# Patient Record
Sex: Female | Born: 1975 | Race: White | Hispanic: Yes | Marital: Married | Smoking: Never smoker
Health system: Southern US, Community
[De-identification: ages and names within clinical notes are randomized; demographics above are authoritative.]

## PROBLEM LIST (undated history)

## (undated) DIAGNOSIS — E039 Hypothyroidism, unspecified: Secondary | ICD-10-CM

## (undated) HISTORY — DX: Hypothyroidism, unspecified: E03.9

## (undated) HISTORY — PX: LAPAROSCOPIC OVARIAN CYSTECTOMY: SHX6248

## (undated) HISTORY — PX: BREAST REDUCTION SURGERY: SHX8

## (undated) HISTORY — PX: DILATION AND CURETTAGE OF UTERUS: SHX78

## (undated) HISTORY — PX: LIPOSUCTION: SHX10

## (undated) HISTORY — PX: APPENDECTOMY: SHX54

---

## 1998-04-22 ENCOUNTER — Emergency Department (HOSPITAL_COMMUNITY): Admission: EM | Admit: 1998-04-22 | Discharge: 1998-04-22 | Payer: Self-pay

## 1998-10-04 ENCOUNTER — Encounter: Payer: Self-pay | Admitting: Obstetrics and Gynecology

## 1998-10-04 ENCOUNTER — Inpatient Hospital Stay (HOSPITAL_COMMUNITY): Admission: AD | Admit: 1998-10-04 | Discharge: 1998-10-04 | Payer: Self-pay | Admitting: Obstetrics and Gynecology

## 1999-03-04 ENCOUNTER — Inpatient Hospital Stay (HOSPITAL_COMMUNITY): Admission: AD | Admit: 1999-03-04 | Discharge: 1999-03-04 | Payer: Self-pay | Admitting: Obstetrics & Gynecology

## 1999-05-17 ENCOUNTER — Inpatient Hospital Stay (HOSPITAL_COMMUNITY): Admission: AD | Admit: 1999-05-17 | Discharge: 1999-05-20 | Payer: Self-pay | Admitting: Obstetrics and Gynecology

## 1999-12-15 ENCOUNTER — Encounter: Admission: RE | Admit: 1999-12-15 | Discharge: 2000-03-14 | Payer: Self-pay | Admitting: Obstetrics and Gynecology

## 2000-07-22 ENCOUNTER — Encounter: Payer: Self-pay | Admitting: Obstetrics and Gynecology

## 2000-07-22 ENCOUNTER — Inpatient Hospital Stay (HOSPITAL_COMMUNITY): Admission: AD | Admit: 2000-07-22 | Discharge: 2000-07-22 | Payer: Self-pay | Admitting: Obstetrics and Gynecology

## 2000-07-23 ENCOUNTER — Other Ambulatory Visit: Admission: RE | Admit: 2000-07-23 | Discharge: 2000-07-23 | Payer: Self-pay | Admitting: Obstetrics and Gynecology

## 2000-07-24 ENCOUNTER — Encounter (INDEPENDENT_AMBULATORY_CARE_PROVIDER_SITE_OTHER): Payer: Self-pay

## 2000-07-24 ENCOUNTER — Ambulatory Visit (HOSPITAL_COMMUNITY): Admission: AD | Admit: 2000-07-24 | Discharge: 2000-07-24 | Payer: Self-pay | Admitting: Obstetrics and Gynecology

## 2001-08-06 ENCOUNTER — Other Ambulatory Visit: Admission: RE | Admit: 2001-08-06 | Discharge: 2001-08-06 | Payer: Self-pay | Admitting: Obstetrics and Gynecology

## 2002-06-28 ENCOUNTER — Emergency Department (HOSPITAL_COMMUNITY): Admission: EM | Admit: 2002-06-28 | Discharge: 2002-06-28 | Payer: Self-pay | Admitting: Emergency Medicine

## 2002-07-28 ENCOUNTER — Other Ambulatory Visit: Admission: RE | Admit: 2002-07-28 | Discharge: 2002-07-28 | Payer: Self-pay | Admitting: Obstetrics and Gynecology

## 2003-04-24 ENCOUNTER — Other Ambulatory Visit: Admission: RE | Admit: 2003-04-24 | Discharge: 2003-04-24 | Payer: Self-pay | Admitting: Obstetrics and Gynecology

## 2003-11-18 ENCOUNTER — Inpatient Hospital Stay (HOSPITAL_COMMUNITY): Admission: AD | Admit: 2003-11-18 | Discharge: 2003-11-21 | Payer: Self-pay | Admitting: Obstetrics and Gynecology

## 2003-11-22 ENCOUNTER — Encounter: Admission: RE | Admit: 2003-11-22 | Discharge: 2003-12-22 | Payer: Self-pay | Admitting: Obstetrics and Gynecology

## 2003-12-23 ENCOUNTER — Encounter: Admission: RE | Admit: 2003-12-23 | Discharge: 2004-01-22 | Payer: Self-pay | Admitting: Obstetrics and Gynecology

## 2004-01-13 ENCOUNTER — Other Ambulatory Visit: Admission: RE | Admit: 2004-01-13 | Discharge: 2004-01-13 | Payer: Self-pay | Admitting: Obstetrics and Gynecology

## 2004-11-15 ENCOUNTER — Ambulatory Visit: Payer: Self-pay | Admitting: Internal Medicine

## 2004-12-01 ENCOUNTER — Ambulatory Visit: Payer: Self-pay | Admitting: Gastroenterology

## 2004-12-02 ENCOUNTER — Ambulatory Visit: Payer: Self-pay | Admitting: Gastroenterology

## 2005-01-27 ENCOUNTER — Other Ambulatory Visit: Admission: RE | Admit: 2005-01-27 | Discharge: 2005-01-27 | Payer: Self-pay | Admitting: Obstetrics and Gynecology

## 2005-07-26 ENCOUNTER — Other Ambulatory Visit: Admission: RE | Admit: 2005-07-26 | Discharge: 2005-07-26 | Payer: Self-pay | Admitting: Obstetrics and Gynecology

## 2006-10-02 ENCOUNTER — Ambulatory Visit (HOSPITAL_COMMUNITY): Admission: RE | Admit: 2006-10-02 | Discharge: 2006-10-02 | Payer: Self-pay | Admitting: Obstetrics and Gynecology

## 2006-10-02 ENCOUNTER — Encounter (INDEPENDENT_AMBULATORY_CARE_PROVIDER_SITE_OTHER): Payer: Self-pay | Admitting: *Deleted

## 2007-07-12 ENCOUNTER — Emergency Department (HOSPITAL_COMMUNITY): Admission: EM | Admit: 2007-07-12 | Discharge: 2007-07-12 | Payer: Self-pay

## 2007-10-25 ENCOUNTER — Inpatient Hospital Stay (HOSPITAL_COMMUNITY): Admission: AD | Admit: 2007-10-25 | Discharge: 2007-10-27 | Payer: Self-pay | Admitting: Obstetrics and Gynecology

## 2009-12-19 ENCOUNTER — Emergency Department (HOSPITAL_COMMUNITY): Admission: EM | Admit: 2009-12-19 | Discharge: 2009-12-19 | Payer: Self-pay | Admitting: Emergency Medicine

## 2010-12-07 LAB — POCT I-STAT, CHEM 8
BUN: 12 mg/dL (ref 6–23)
Calcium, Ion: 1.04 mmol/L — ABNORMAL LOW (ref 1.12–1.32)
Chloride: 104 mEq/L (ref 96–112)
Creatinine, Ser: 0.8 mg/dL (ref 0.4–1.2)
Glucose, Bld: 94 mg/dL (ref 70–99)
HCT: 43 % (ref 36.0–46.0)
Hemoglobin: 14.6 g/dL (ref 12.0–15.0)
Potassium: 3.5 mEq/L (ref 3.5–5.1)
Sodium: 138 mEq/L (ref 135–145)
TCO2: 20 mmol/L (ref 0–100)

## 2010-12-07 LAB — ETHANOL: Alcohol, Ethyl (B): 181 mg/dL — ABNORMAL HIGH (ref 0–10)

## 2011-02-03 NOTE — H&P (Signed)
[facility]  Patient:    Kelsey [name], Kelsey [name]                      MRN: [id number] Adm. Date:  [id number] Attending:  Trevor Iha                         History and Physical  HISTORY OF PRESENT ILLNESS:   The patient is a 35 year old gravida 2, para 1, at approximately 2-1/2 weeks estimated gestational age by her last menstrual period who presented three days ago to the emergency room with spotting and cramping.  Ultrasound at that time revealed a 6-1/2 week estimated gestational sac with no embryo seen.  Felt to be consistent with ________ embryonic pregnancy.  The next day in the office, she had a beta hCG and a progesterone level drawn which showed progesterone level of 9.6 consistent with a nonviable pregnancy and a quantitative beta hCG of 16,761.  At that time, pelvic exam was consistent with an incomplete abortion, and the patient was scheduled for dilation and evacuation.  She presents today for this.  LABORATORY DATA:              Blood type is B-positive.  Hemoglobin 12.5.  PHYSICAL EXAMINATION:  VITAL SIGNS:                  Blood pressure is 100.62, weight 184.  HEART:                        Regular rate and rhythm.  LUNGS:                        Clear to auscultation bilaterally.  PELVIC EXAMINATION:           Reveals a uterus that is 6-8 weeks size.  Cervix is 1 cm dilated, long, minimal to mild bleeding.  IMPRESSION AND PLAN:          Incomplete abortion, approximately 6-1/2 to 7 weeks estimated gestational size, 10-1/2 weeks estimated gestational age. The patient was offered expectant management versus D&E.  The patient request D&E.  Risks and benefits were discussed at length including, but not limited to risks of infection, bleeding, damage to uterus, tubes, ovaries, bowel, or bladder.  The patient gives her informed consent. DD:  07/24/00 TD:  07/24/00 Job: [id number]

## 2011-02-03 NOTE — Op Note (Signed)
Kelsey [name], Kelsey [name]               ACCOUNT NO.:  [id number]   MEDICAL RECORD NO.:  [id number]          PATIENT TYPE:  AMB   LOCATION:  SDC                           FACILITY:  [facility]   PHYSICIAN:  Dineen Kid. Rana Snare, M.D.    DATE OF BIRTH:  Sep 01, 1976   DATE OF PROCEDURE:  10/02/2006  DATE OF DISCHARGE:                               OPERATIVE REPORT   PREOPERATIVE DIAGNOSIS:  Missed abortion, approximately eight weeks  gestational age.   POSTOPERATIVE DIAGNOSIS:  Missed abortion, approximately eight weeks  gestational age.   OPERATION/PROCEDURE:  Dilation and evacuation.   SURGEON:  Dineen Kid. Rana Snare, M.D.   ANESTHESIA:  Monitored anesthesia care and local block by paracervical  block.   INDICATIONS:  Mrs. Kelsey [name] is a 35 year old, gravida 6, para 5, who  presented yesterday to the office for new OB evaluation.  Ultrasound  reveals 7-1/[redacted] week gestational size fetus with no fetal heart activity  consistent with a missed abortion.  She desires dilation and evacuation.  The risks and benefits were and informed consent was obtained.  Blood  type is B positive.   DESCRIPTION OF PROCEDURE:  After adequate anesthesia, the patient was  placed in the dorsal lithotomy position. She was sterilely prepped and  draped.  The bladder was sterilely drained.  Weighted speculum was  placed.  Paracervical block was placed with 1% Xylocaine a total of  __________ used.  Tenaculum placed on the anterior lip of the cervix.  Uterus was sounded to 10 cm.  Uterus was dilated to a #29 Pratt dilator.  An 8 mm suction curet was inserted.  Products of conception were  retrieved.  Curettage was performed until a gritty surface was felt  throughout the endometrial cavity and no residual products of conception  was retrieved.  The patient was given Methergine 0.2 mg IM with good  __________ response.  The curet was then removed. Tenaculum removed from  the anterior lip of the cervix and found to be hemostatic.  Speculum  was  then removed. The patient was transferred to the recovery room in stable  condition.   Sponge and instrument count was normal x3.  Estimated blood loss was  minimal.  The patient received Toradol 30 mg IV postoperatively.   DISPOSITION:  The patient will be discharged home and will follow up in  the office in two to three weeks.  She was given a prescription for  Methergine 0.2 mg to take every eight hours for two days, doxycycline  100 mg p.o. b.i.d. for seven days.  Return in two to three weeks for  postoperative check.      Dineen Kid Rana Snare, M.D.  Electronically Signed    DCL/MEDQ  D:  10/02/2006  T:  10/02/2006  Job:  [id number]

## 2011-02-03 NOTE — Op Note (Signed)
[facility]  Patient:    [name], Kelsey [name]                      MRN: [id number] Adm. Date:  [id number] Attending:  Trevor Iha                           Operative Report  PREOPERATIVE DIAGNOSIS:       Incomplete abortion approximately six and a half weeks.  POSTOPERATIVE DIAGNOSIS:      Incomplete abortion approximately six and a half weeks.  PROCEDURE:                    Dilation and evacuation.  ANESTHESIA:                   Monitored anesthetic care and paracervical block.  SURGEON:                      Trevor Iha, M.D.  INDICATIONS:                  Ms. Myer Haff is a 35 year old G2, P1 at 7 1/2 weeks estimated gestational age with a 6 1/2 week sized gestational age consistent with anembrionic pregnancy.  Also, progesterone level of 9.6 and cervix is dilated to 1 cm with cramping and mild bleeding.  She presents today for dilation and evacuation for incomplete abortion.  Risks and benefits were discussed at length.  Informed consent was obtained.  Patients blood type was B+.  Hemoglobin was 12.5.  DESCRIPTION OF PROCEDURE:     After adequate analgesia the patient placed in the dorsal lithotomy position she is sterilely prepped and draped.  Bladder was sterilely drained.  Tenaculum was placed on the anterior lip of the cervix.  Uterus is sounded to 10 cm, easily dilated to #31 Hegar dilator.  An 8 mm suction curette was inserted and products of conception were retrieved until the endometrial cavity was felt to be empty followed by gentle sharp curettage sounding a gritty surface throughout the endometrial cavity.  Second pass of the curette with no more products of conception retrieved.  At this time the curette was removed.  Tenaculum was removed from the anterior lip of the cervix and noted to be hemostatic.  The speculum was then removed.  The patient tolerated procedure well with stable transfer to the recovery room. The  patient received Methergine 0.2 mg IM at the end in the operating room as well as Toradol 30 mg IV.  ESTIMATED BLOOD LOSS:         25 cc.  DISPOSITION:                  Patient will be discharged home.  Follow up in the office in two to three weeks.  She is given a routine instruction sheet for D&C and a prescription for doxycycline 100 mg p.o. b.i.d. x 7 days and Methergine 0.2 mg to take every eight hours for two days. DD:  07/24/00 TD:  07/25/00 Job: [id number]

## 2011-06-09 LAB — CBC
HCT: 35.6 — ABNORMAL LOW
Hemoglobin: 11.8 — ABNORMAL LOW
Hemoglobin: 11.9 — ABNORMAL LOW
MCHC: 33.5
MCHC: 33.5
MCV: 89.9
RBC: 3.96
RBC: 3.97
WBC: 6.8

## 2011-06-09 LAB — RPR: RPR Ser Ql: NONREACTIVE

## 2013-07-06 ENCOUNTER — Ambulatory Visit (INDEPENDENT_AMBULATORY_CARE_PROVIDER_SITE_OTHER): Payer: BC Managed Care – PPO | Admitting: Family Medicine

## 2013-07-06 VITALS — BP 102/58 | HR 64 | Temp 98.3°F | Resp 16 | Ht 62.0 in | Wt 146.0 lb

## 2013-07-06 DIAGNOSIS — M436 Torticollis: Secondary | ICD-10-CM

## 2013-07-06 DIAGNOSIS — M542 Cervicalgia: Secondary | ICD-10-CM

## 2013-07-06 MED ORDER — OXYCODONE-ACETAMINOPHEN 5-325 MG PO TABS: 1.0000 | ORAL_TABLET | ORAL | Status: DC | PRN

## 2013-07-06 MED ORDER — PREDNISONE 20 MG PO TABS: ORAL_TABLET | ORAL | Status: DC

## 2013-07-06 NOTE — Patient Instructions (Signed)
Torticollis, Acute °You have suddenly (acutely) developed a twisted neck (torticollis). This is usually a self-limited condition. °CAUSES  °Acute torticollis may be caused by malposition, trauma or infection. Most commonly, acute torticollis is caused by sleeping in an awkward position. Torticollis may also be caused by the flexion, extension or twisting of the neck muscles beyond their normal position. Sometimes, the exact cause may not be known. °SYMPTOMS  °Usually, there is pain and limited movement of the neck. Your neck may twist to one side. °DIAGNOSIS  °The diagnosis is often made by physical examination. X-rays, CT scans or MRIs may be done if there is a history of trauma or concern of infection. °TREATMENT  °For a common, stiff neck that develops during sleep, treatment is focused on relaxing the contracted neck muscle. Medications (including shots) may be used to treat the problem. Most cases resolve in several days. Torticollis usually responds to conservative physical therapy. If left untreated, the shortened and spastic neck muscle can cause deformities in the face and neck. Rarely, surgery is required. °HOME CARE INSTRUCTIONS  °· Use over-the-counter and prescription medications as directed by your caregiver. °· Do stretching exercises and massage the neck as directed by your caregiver. °· Follow up with physical therapy if needed and as directed by your caregiver. °SEEK IMMEDIATE MEDICAL CARE IF:  °· You develop difficulty breathing or noisy breathing (stridor). °· You drool, develop trouble swallowing or have pain with swallowing. °· You develop numbness or weakness in the hands or feet. °· You have changes in speech or vision. °· You have problems with urination or bowel movements. °· You have difficulty walking. °· You have a fever. °· You have increased pain. °MAKE SURE YOU:  °· Understand these instructions. °· Will watch your condition. °· Will get help right away if you are not doing well or  get worse. °Document Released: 09/01/2000 Document Revised: 11/27/2011 Document Reviewed: 10/13/2009 °ExitCare® Patient Information ©2014 [employer]. ° °

## 2013-07-06 NOTE — Progress Notes (Signed)
Subjective:  This chart was scribed for Elvina Sidle, MD by Carl Best, Medical Scribe. This patient was seen in Room 11 and the patient's care was started at 11:05 AM.    Patient ID: Kelsey [name], female    DOB: 14-Sep-1976, 37 y.o.   MRN: [id number]  HPI HPI Comments: Kelsey [name] is a 37 y.o. female who presents to the [facility] complaining of constant, sharp neck pain radiating to the right side of her head when she lays down that woke her out of her sleep this morning.  She states that the tips of her right index, middle, and ring fingers got "tingly" at the time of the incident and that her eye felt "hot".  She states that leaning her head to the right and rotating her head to her left aggravates the pain.  She states that she has had muscle spasms in her neck before.  The patient denies any recent falls or traumas.  She states that she has taken flexeril for her pain with no relief.  She believes that the medication had expired.  She states that she went for a long run yesterday.  The patient is left-hand dominant.  She states that she works at [employer].    No past medical history on file. Past Surgical History  Procedure Laterality Date   Appendectomy     Dilation and curettage of uterus     Family History  Problem Relation Age of Onset   Diabetes Mother    Hyperlipidemia Mother    Hypertension Mother    Heart disease Father    Hyperlipidemia Father    Hypertension Father    Hyperlipidemia Sister    Hypertension Sister    Hyperlipidemia Brother    Hypertension Brother    History   Social History   Marital Status: Divorced    Spouse Name: N/A    Number of Children: N/A   Years of Education: N/A   Occupational History   Not on file.   Social History Main Topics   Smoking status: Never Smoker    Smokeless tobacco: Not on file   Alcohol Use: Yes   Drug Use: No   Sexual Activity: Not on file   Other Topics Concern    Not on file   Social History Narrative   No narrative on file   Current outpatient prescriptions:chlorzoxazone (PARAFON) 500 MG tablet, Take 500 mg by mouth 4 (four) times daily as needed for muscle spasms., Disp: , Rfl: ;  diclofenac (VOLTAREN) 75 MG EC tablet, Take 75 mg by mouth 2 (two) times daily., Disp: , Rfl: ;  fish oil-omega-3 fatty acids 1000 MG capsule, Take 2 g by mouth daily., Disp: , Rfl: ;  phentermine 37.5 MG capsule, Take 37.5 mg by mouth every morning., Disp: , Rfl:  vitamin A 7500 UNIT capsule, Take 7,500 Units by mouth daily., Disp: , Rfl:   Filed Vitals:   07/06/13 0947  BP: 102/58  Pulse: 64  Temp: 98.3 F (36.8 C)  TempSrc: Oral  Resp: 16  Height: 5\' 2"  (1.575 m)  Weight: 146 lb (66.225 kg)  SpO2: 99%     Review of Systems  Musculoskeletal: Positive for neck pain and neck stiffness.  Neurological: Positive for numbness.  All other systems reviewed and are negative.       Objective:   Physical Exam  Nursing note and vitals reviewed. Constitutional: She is oriented to person, place, and time. She appears well-developed  and well-nourished.  HENT:  Head: Normocephalic and atraumatic.  Eyes: Conjunctivae and EOM are normal. Pupils are equal, round, and reactive to light.  Neck: Normal range of motion. Neck supple.  Cardiovascular: Normal rate, regular rhythm and normal heart sounds.   Pulmonary/Chest: Effort normal and breath sounds normal.  Abdominal: Soft. Bowel sounds are normal.  Musculoskeletal: Normal range of motion.  Neurological: She is alert and oriented to person, place, and time.  Skin: Skin is warm and dry.  Psychiatric: She has a normal mood and affect.  reflexes and arm/hand strength normal  DIAGNOSTIC STUDIES: Oxygen Saturation is 99% on room air, normal by my interpretation.    COORDINATION OF CARE: 11:08 AM- Discussed discharging the patient with pain medication and muscle relaxers.  Advised the patient to place a warm towel on  the back of her neck and move her neck around when she wakes up in the morning.  The patient agreed to the treatment plan.         Assessment & Plan:   Meds ordered this encounter  Medications   phentermine 37.5 MG capsule    Sig: Take 37.5 mg by mouth every morning.   fish oil-omega-3 fatty acids 1000 MG capsule    Sig: Take 2 g by mouth daily.   vitamin A 7500 UNIT capsule    Sig: Take 7,500 Units by mouth daily.   diclofenac (VOLTAREN) 75 MG EC tablet    Sig: Take 75 mg by mouth 2 (two) times daily.   chlorzoxazone (PARAFON) 500 MG tablet    Sig: Take 500 mg by mouth 4 (four) times daily as needed for muscle spasms.    I personally performed the services described in this documentation, which was scribed in my presence. The recorded information has been reviewed and is accurate.  Neck pain - Plan: oxyCODONE-acetaminophen (ROXICET) 5-325 MG per tablet, predniSONE (DELTASONE) 20 MG tablet  Torticollis - Plan: oxyCODONE-acetaminophen (ROXICET) 5-325 MG per tablet, predniSONE (DELTASONE) 20 MG tablet  Signed, Elvina Sidle, MD

## 2013-07-24 ENCOUNTER — Other Ambulatory Visit: Payer: Self-pay

## 2015-03-11 ENCOUNTER — Other Ambulatory Visit: Payer: Self-pay | Admitting: Obstetrics and Gynecology

## 2015-03-12 LAB — CYTOLOGY - PAP

## 2016-04-11 LAB — OB RESULTS CONSOLE HIV ANTIBODY (ROUTINE TESTING): HIV: NONREACTIVE

## 2016-04-11 LAB — OB RESULTS CONSOLE ABO/RH: RH Type: POSITIVE

## 2016-04-11 LAB — OB RESULTS CONSOLE ANTIBODY SCREEN: ANTIBODY SCREEN: NEGATIVE

## 2016-04-11 LAB — OB RESULTS CONSOLE GC/CHLAMYDIA
CHLAMYDIA, DNA PROBE: NEGATIVE
GC PROBE AMP, GENITAL: NEGATIVE

## 2016-04-11 LAB — OB RESULTS CONSOLE RUBELLA ANTIBODY, IGM: RUBELLA: IMMUNE

## 2016-04-11 LAB — OB RESULTS CONSOLE RPR: RPR: NONREACTIVE

## 2016-04-11 LAB — OB RESULTS CONSOLE HEPATITIS B SURFACE ANTIGEN: Hepatitis B Surface Ag: NEGATIVE

## 2016-09-18 NOTE — L&D Delivery Note (Signed)
Operative Delivery Note At 9:40 PM a viable female was delivered via Vaginal, [employer], operational(Extractor).  Presentation: vertex; Position: Left,, Occiput,, Posterior; Station: +3.  Verbal consent: obtained from patient.  Risks and benefits discussed in detail.  Risks include, but are not limited to the risks of anesthesia, bleeding, infection, damage to maternal tissues, fetal cephalhematoma.  There is also the risk of inability to effect vaginal delivery of the head, or shoulder dystocia that cannot be resolved by established maneuvers, leading to the need for emergency cesarean section.  APGAR: 8, 9; weight  .   Placenta status: delivered spontaneously  Cord:  with the following complications:none .  Cord pH: not obtained  Anesthesia:  epidural Instruments: mushroom Episiotomy: None Lacerations: none  Suture Repair: not applicable Est. Blood Loss (mL):  300  Mom to postpartum.  Baby to Couplet care / Skin to Skin.  Tryton Bodi L 11/10/2016, 9:49 PM

## 2016-11-05 ENCOUNTER — Inpatient Hospital Stay (HOSPITAL_COMMUNITY)
Admission: AD | Admit: 2016-11-05 | Discharge: 2016-11-05 | Disposition: A | Discharge status: Home / Self Care | Payer: BLUE CROSS/BLUE SHIELD | Source: Ambulatory Visit | Attending: Obstetrics and Gynecology | Admitting: Obstetrics and Gynecology

## 2016-11-05 ENCOUNTER — Encounter (HOSPITAL_COMMUNITY): Payer: Self-pay

## 2016-11-05 DIAGNOSIS — O26893 Other specified pregnancy related conditions, third trimester: Secondary | ICD-10-CM

## 2016-11-05 DIAGNOSIS — Z3A37 37 weeks gestation of pregnancy: Secondary | ICD-10-CM | POA: Diagnosis not present

## 2016-11-05 DIAGNOSIS — N898 Other specified noninflammatory disorders of vagina: Secondary | ICD-10-CM | POA: Insufficient documentation

## 2016-11-05 DIAGNOSIS — O471 False labor at or after 37 completed weeks of gestation: Secondary | ICD-10-CM

## 2016-11-05 DIAGNOSIS — O479 False labor, unspecified: Secondary | ICD-10-CM

## 2016-11-05 LAB — POCT FERN TEST: POCT Fern Test: NEGATIVE

## 2016-11-05 NOTE — MAU Provider Note (Signed)
Chief Complaint:  Rupture of Membranes   None     HPI: Kelsey [name] is a 41 y.o. G4P0 at [redacted]w[redacted]d who presents to maternity admissions reporting possible ROM.  Patient states yesterday, at about noon, she noted her mucous plug came out. Shortly after that, she noted a decent amount of fluid come out of the vagina and then slowly leaking the rest of the day and this morning, but not soaking underwear.   Denies strong regular contractions or vaginal bleeding. Good fetal movement.   Pregnancy Course:   Past Medical History: History reviewed. No pertinent past medical history.  Past obstetric history: OB History  Gravida Para Term Preterm AB Living  4         3  SAB TAB Ectopic Multiple Live Births               # Outcome Date GA Lbr Len/2nd Weight Sex Delivery Anes PTL Lv  4 Current           3 Gravida           2 Gravida           1 [place]               Past Surgical History: Past Surgical History:  Procedure Laterality Date  . APPENDECTOMY    . DILATION AND CURETTAGE OF UTERUS       Family History: Family History  Problem Relation Age of Onset  . Diabetes Mother   . Hyperlipidemia Mother   . Hypertension Mother   . Heart disease Father   . Hyperlipidemia Father   . Hypertension Father   . Hyperlipidemia Sister   . Hypertension Sister   . Hyperlipidemia Brother   . Hypertension Brother     Social History: Social History  Substance Use Topics  . Smoking status: Never Smoker  . Smokeless tobacco: Never Used  . Alcohol use No    Allergies: No Known Allergies  Meds:  Prescriptions Prior to Admission  Medication Sig Dispense Refill Last Dose  . hydrOXYzine (VISTARIL) 25 MG capsule Take 25 mg by mouth daily as needed for anxiety.   11/04/2016  . Prenatal Vit-Fe Fumarate-FA (MULTIVITAMIN-PRENATAL) 27-0.8 MG TABS tablet Take 1 tablet by mouth daily at 12 noon.   11/04/2016    I have reviewed patient's Past Medical Hx, Surgical Hx, Family Hx, Social Hx,  medications and allergies.   ROS:  A comprehensive ROS was negative except per HPI.    Physical Exam   Patient Vitals for the past 24 hrs:  BP Temp Pulse Resp  11/05/16 1233 105/67 - 90 18  11/05/16 1156 112/62 97.5 F (36.4 C) 92 18   Constitutional: Well-developed, well-nourished female in no acute distress.  Cardiovascular: normal rate Respiratory: normal effort GI: Abd soft, non-tender, gravid appropriate for gestational age. Pos BS x 4 MS: Extremities nontender, no edema, normal ROM Neurologic: Alert and oriented x 4.  GU: Neg CVAT. Pelvic: NEFG, large amount of normal physiologic discharge, no blood, cervix clean, NO POOLING. No CMT Dilation: 3 Effacement (%): 60 Cervical Position: Posterior Presentation: Vertex Exam by:: Dr. Omer Jack     Labs: Results for orders placed or performed during the hospital encounter of 11/05/16 (from the past 24 hour(s))  Fern Test     Status: None   Collection Time: 11/05/16 12:33 PM  Result Value Ref Range   POCT Fern Test Negative = intact amniotic membranes     Imaging:  No  results found.  MAU Course: SSE - no pooling SVE - 3/60/-3 FERN NEG  I personally reviewed the patient's NST today, found to be REACTIVE. 140 bpm, mod var, +accels, no decels. CTX: Infrequent.   MDM: Plan of care reviewed with patient, including labs and tests ordered and medical treatment. Discussed with patient normal physiologic discharge, not likely ROM. OK for discharge, not currently in labor.    Assessment: 1. Vaginal discharge during pregnancy in third trimester   2. False labor     Plan: Discharge home in stable condition.  Labor precautions and fetal kick counts    Allergies as of 11/05/2016   No Known Allergies     Medication List    TAKE these medications   hydrOXYzine 25 MG capsule Commonly known as:  VISTARIL Take 25 mg by mouth daily as needed for anxiety.   multivitamin-prenatal 27-0.8 MG Tabs tablet Take 1 tablet by  mouth daily at 12 noon.       Jen MowElizabeth Amariz Flamenco, DO OB Fellow [facility] 11/05/2016 12:37 PM

## 2016-11-05 NOTE — MAU Note (Signed)
Pt presents to MAU with ROM. Reports yesterday around 1200 she felt a small gush, and has been leaking since. Reports mild, irregular ctx.Denies bleeding. Reports good fetal movement.

## 2016-11-05 NOTE — Discharge Instructions (Signed)
Braxton Hicks Contractions °Contractions of the uterus can occur throughout pregnancy. Contractions are not always a sign that you are in labor.  °WHAT ARE BRAXTON HICKS CONTRACTIONS?  °Contractions that occur before labor are called Braxton Hicks contractions, or false labor. Toward the end of pregnancy (32-34 weeks), these contractions can develop more often and may become more forceful. This is not true labor because these contractions do not result in opening (dilatation) and thinning of the cervix. They are sometimes difficult to tell apart from true labor because these contractions can be forceful and people have different pain tolerances. You should not feel embarrassed if you go to the hospital with false labor. Sometimes, the only way to tell if you are in true labor is for your health care provider to look for changes in the cervix. °If there are no prenatal problems or other health problems associated with the pregnancy, it is completely safe to be sent home with false labor and await the onset of true labor. °HOW CAN YOU TELL THE DIFFERENCE BETWEEN TRUE AND FALSE LABOR? °False Labor  °· The contractions of false labor are usually shorter and not as hard as those of true labor.   °· The contractions are usually irregular.   °· The contractions are often felt in the front of the lower abdomen and in the groin.   °· The contractions may go away when you walk around or change positions while lying down.   °· The contractions get weaker and are shorter lasting as time goes on.   °· The contractions do not usually become progressively stronger, regular, and closer together as with true labor.   °True Labor  °· Contractions in true labor last 30-70 seconds, become very regular, usually become more intense, and increase in frequency.   °· The contractions do not go away with walking.   °· The discomfort is usually felt in the top of the uterus and spreads to the lower abdomen and low back.   °· True labor can be  determined by your health care provider with an exam. This will show that the cervix is dilating and getting thinner.   °WHAT TO REMEMBER °· Keep up with your usual exercises and follow other instructions given by your health care provider.   °· Take medicines as directed by your health care provider.   °· Keep your regular prenatal appointments.   °· Eat and drink lightly if you think you are going into labor.   °· If Braxton Hicks contractions are making you uncomfortable:   °¨ Change your position from lying down or resting to walking, or from walking to resting.   °¨ Sit and rest in a tub of warm water.   °¨ Drink 2-3 glasses of water. Dehydration may cause these contractions.   °¨ Do slow and deep breathing several times an hour.   °WHEN SHOULD I SEEK IMMEDIATE MEDICAL CARE? °Seek immediate medical care if: °· Your contractions become stronger, more regular, and closer together.   °· You have fluid leaking or gushing from your vagina.   °· You have a fever.     °· You have vaginal bleeding.   °· You have continuous abdominal pain.   °· You have low back pain that you never had before.   °· You feel your baby's head pushing down and causing pelvic pressure.   °· Your baby is not moving as much as it used to.   °This information is not intended to replace advice given to you by your health care provider. Make sure you discuss any questions you have with your health care provider. °Document Released: 09/04/2005 Document   Revised: 12/27/2015 Document Reviewed: 06/16/2013 °Elsevier Interactive Patient Education © 2017 [employer] ° °

## 2016-11-09 ENCOUNTER — Encounter (HOSPITAL_COMMUNITY): Payer: Self-pay | Admitting: *Deleted

## 2016-11-09 ENCOUNTER — Telehealth (HOSPITAL_COMMUNITY): Payer: Self-pay | Admitting: *Deleted

## 2016-11-09 NOTE — Telephone Encounter (Signed)
Preadmission screen  

## 2016-11-10 ENCOUNTER — Inpatient Hospital Stay (HOSPITAL_COMMUNITY)
Admission: AD | Admit: 2016-11-10 | Discharge: 2016-11-12 | DRG: 775 | Disposition: A | Discharge status: Home / Self Care | Payer: BLUE CROSS/BLUE SHIELD | Source: Ambulatory Visit | Attending: Obstetrics and Gynecology | Admitting: Obstetrics and Gynecology

## 2016-11-10 ENCOUNTER — Inpatient Hospital Stay (HOSPITAL_COMMUNITY): Payer: BLUE CROSS/BLUE SHIELD | Admitting: Anesthesiology

## 2016-11-10 ENCOUNTER — Encounter (HOSPITAL_COMMUNITY): Payer: Self-pay

## 2016-11-10 DIAGNOSIS — O4292 Full-term premature rupture of membranes, unspecified as to length of time between rupture and onset of labor: Secondary | ICD-10-CM | POA: Diagnosis present

## 2016-11-10 DIAGNOSIS — Z8249 Family history of ischemic heart disease and other diseases of the circulatory system: Secondary | ICD-10-CM | POA: Diagnosis not present

## 2016-11-10 DIAGNOSIS — Z833 Family history of diabetes mellitus: Secondary | ICD-10-CM

## 2016-11-10 DIAGNOSIS — Z6837 Body mass index (BMI) 37.0-37.9, adult: Secondary | ICD-10-CM | POA: Diagnosis not present

## 2016-11-10 DIAGNOSIS — Z3A38 38 weeks gestation of pregnancy: Secondary | ICD-10-CM | POA: Diagnosis not present

## 2016-11-10 DIAGNOSIS — O99214 Obesity complicating childbirth: Secondary | ICD-10-CM | POA: Diagnosis present

## 2016-11-10 DIAGNOSIS — E669 Obesity, unspecified: Secondary | ICD-10-CM | POA: Diagnosis present

## 2016-11-10 LAB — CBC
HCT: 34.6 % — ABNORMAL LOW (ref 36.0–46.0)
Hemoglobin: 12.2 g/dL (ref 12.0–15.0)
MCH: 31 pg (ref 26.0–34.0)
MCHC: 35.3 g/dL (ref 30.0–36.0)
MCV: 87.8 fL (ref 78.0–100.0)
PLATELETS: 241 10*3/uL (ref 150–400)
RBC: 3.94 MIL/uL (ref 3.87–5.11)
RDW: 15.3 % (ref 11.5–15.5)
WBC: 9.4 10*3/uL (ref 4.0–10.5)

## 2016-11-10 LAB — URINALYSIS, ROUTINE W REFLEX MICROSCOPIC
BILIRUBIN URINE: NEGATIVE
Glucose, UA: NEGATIVE mg/dL
Hgb urine dipstick: NEGATIVE
Ketones, ur: NEGATIVE mg/dL
Leukocytes, UA: NEGATIVE
NITRITE: NEGATIVE
Protein, ur: NEGATIVE mg/dL
Specific Gravity, Urine: 1.018 (ref 1.005–1.030)
pH: 6 (ref 5.0–8.0)

## 2016-11-10 LAB — TYPE AND SCREEN
ABO/RH(D): B POS
ANTIBODY SCREEN: NEGATIVE

## 2016-11-10 LAB — ABO/RH: ABO/RH(D): B POS

## 2016-11-10 MED ORDER — FLEET ENEMA 7-19 GM/118ML RE ENEM: 1.0000 | ENEMA | RECTAL | Status: DC | PRN

## 2016-11-10 MED ORDER — PHENYLEPHRINE 40 MCG/ML (10ML) SYRINGE FOR IV PUSH (FOR BLOOD PRESSURE SUPPORT)
80.0000 ug | PREFILLED_SYRINGE | INTRAVENOUS | Status: DC | PRN
  Filled 2016-11-10: qty 5

## 2016-11-10 MED ORDER — LACTATED RINGERS IV SOLN: INTRAVENOUS | Status: DC

## 2016-11-10 MED ORDER — PHENYLEPHRINE 40 MCG/ML (10ML) SYRINGE FOR IV PUSH (FOR BLOOD PRESSURE SUPPORT)
80.0000 ug | PREFILLED_SYRINGE | INTRAVENOUS | Status: DC | PRN
  Filled 2016-11-10: qty 10
  Filled 2016-11-10: qty 5

## 2016-11-10 MED ORDER — OXYCODONE-ACETAMINOPHEN 5-325 MG PO TABS: 2.0000 | ORAL_TABLET | ORAL | Status: DC | PRN

## 2016-11-10 MED ORDER — SOD CITRATE-CITRIC ACID 500-334 MG/5ML PO SOLN: 30.0000 mL | ORAL | Status: DC | PRN

## 2016-11-10 MED ORDER — EPHEDRINE 5 MG/ML INJ
10.0000 mg | INTRAVENOUS | Status: DC | PRN
  Filled 2016-11-10: qty 4

## 2016-11-10 MED ORDER — PENICILLIN G POT IN DEXTROSE 60000 UNIT/ML IV SOLN
3.0000 10*6.[IU] | INTRAVENOUS | Status: DC
  Administered 2016-11-10 (×2): 3 10*6.[IU] via INTRAVENOUS
  Filled 2016-11-10 (×7): qty 50

## 2016-11-10 MED ORDER — LIDOCAINE HCL (PF) 1 % IJ SOLN
30.0000 mL | INTRAMUSCULAR | Status: DC | PRN
  Filled 2016-11-10: qty 30

## 2016-11-10 MED ORDER — DEXTROSE 5 % IV SOLN
5.0000 10*6.[IU] | Freq: Once | INTRAVENOUS | Status: AC
  Administered 2016-11-10: 5 10*6.[IU] via INTRAVENOUS
  Filled 2016-11-10: qty 5

## 2016-11-10 MED ORDER — LACTATED RINGERS IV SOLN: 500.0000 mL | Freq: Once | INTRAVENOUS | Status: DC

## 2016-11-10 MED ORDER — OXYTOCIN BOLUS FROM INFUSION
500.0000 mL | Freq: Once | INTRAVENOUS | Status: AC
  Administered 2016-11-10: 500 mL via INTRAVENOUS

## 2016-11-10 MED ORDER — TERBUTALINE SULFATE 1 MG/ML IJ SOLN
0.2500 mg | Freq: Once | INTRAMUSCULAR | Status: DC | PRN
Start: 2016-11-10 — End: 2016-11-11
  Filled 2016-11-10: qty 1

## 2016-11-10 MED ORDER — OXYCODONE-ACETAMINOPHEN 5-325 MG PO TABS: 1.0000 | ORAL_TABLET | ORAL | Status: DC | PRN

## 2016-11-10 MED ORDER — LACTATED RINGERS IV SOLN: 500.0000 mL | INTRAVENOUS | Status: DC | PRN

## 2016-11-10 MED ORDER — LIDOCAINE HCL (PF) 1 % IJ SOLN
INTRAMUSCULAR | Status: DC | PRN
  Administered 2016-11-10: 5 mL via EPIDURAL
  Administered 2016-11-10: 3 mL via EPIDURAL
  Administered 2016-11-10: 2 mL via EPIDURAL

## 2016-11-10 MED ORDER — OXYTOCIN 40 UNITS IN LACTATED RINGERS INFUSION - SIMPLE MED
1.0000 m[IU]/min | INTRAVENOUS | Status: DC
  Administered 2016-11-10: 2 m[IU]/min via INTRAVENOUS
  Filled 2016-11-10: qty 1000

## 2016-11-10 MED ORDER — ACETAMINOPHEN 325 MG PO TABS: 650.0000 mg | ORAL_TABLET | ORAL | Status: DC | PRN

## 2016-11-10 MED ORDER — DIPHENHYDRAMINE HCL 50 MG/ML IJ SOLN: 12.5000 mg | INTRAMUSCULAR | Status: DC | PRN

## 2016-11-10 MED ORDER — FENTANYL 2.5 MCG/ML BUPIVACAINE 1/10 % EPIDURAL INFUSION (WH - ANES)
14.0000 mL/h | INTRAMUSCULAR | Status: DC | PRN
  Administered 2016-11-10: 14 mL/h via EPIDURAL
  Filled 2016-11-10: qty 100

## 2016-11-10 MED ORDER — ONDANSETRON HCL 4 MG/2ML IJ SOLN
4.0000 mg | Freq: Four times a day (QID) | INTRAMUSCULAR | Status: DC | PRN
  Administered 2016-11-10: 4 mg via INTRAVENOUS
  Filled 2016-11-10: qty 2

## 2016-11-10 MED ORDER — OXYTOCIN 40 UNITS IN LACTATED RINGERS INFUSION - SIMPLE MED: 2.5000 [IU]/h | INTRAVENOUS | Status: DC

## 2016-11-10 NOTE — MAU Note (Signed)
Water broke this morning around 0630, clear fluid - still coming. No bleeding, sporadic intense contractions. Was 2-3 when last checked. No problems with preg.

## 2016-11-10 NOTE — H&P (Signed)
Kelsey [name] is a 41 y.o. G 7 P 3 presents with SROM this am EGA 38 w 3 days. OB History    Gravida Para Term Preterm AB Living   7 3 3   3 3    SAB TAB Ectopic Multiple Live Births   2 1     3      Past Medical History:  Diagnosis Date  . Hypothyroidism    Past Surgical History:  Procedure Laterality Date  . APPENDECTOMY    . DILATION AND CURETTAGE OF UTERUS    . LAPAROSCOPIC OVARIAN CYSTECTOMY     Family History: family history includes Diabetes in her mother and sister; Heart disease in her father; Hyperlipidemia in her father and mother; Hypertension in her father and mother. Social History:  reports that she has never smoked. She has never used smokeless tobacco. She reports that she does not drink alcohol or use drugs.     Maternal Diabetes: No Genetic Screening: Normal Maternal Ultrasounds/Referrals: Normal Fetal Ultrasounds or other Referrals:  None Maternal Substance Abuse:  No Significant Maternal Medications:  None Significant Maternal Lab Results:  None Other Comments:  None  Review of Systems  All other systems reviewed and are negative.  History Dilation: 4 Effacement (%): 80 Station: -2 Exam by:: Shontia Gillooly Blood pressure 109/59, pulse 97, temperature 97.7 F (36.5 C), temperature source Oral, resp. rate 16, height 5\' 2"  (1.575 m), weight 93 kg (205 lb). Maternal Exam:  Abdomen: Fetal presentation: vertex     Physical Exam  Nursing note and vitals reviewed. Constitutional: She appears well-developed and well-nourished.  HENT:  Head: Normocephalic and atraumatic.  Eyes: Pupils are equal, round, and reactive to light.  Neck: Normal range of motion.  Cardiovascular: Normal rate and regular rhythm.     Prenatal labs: ABO, Rh: B/Positive/-- (07/25 0000) Antibody: Negative (07/25 0000) Rubella: Immune (07/25 0000) RPR: Nonreactive (07/25 0000)  HBsAg: Negative (07/25 0000)  HIV: Non-reactive (07/25 0000)  GBS:     Assessment/Plan: IUP at 38 w  3 days  Labor SROM Admit Antibiotics Epidural   Zhoey Blackstock L 11/10/2016, 9:32 AM

## 2016-11-10 NOTE — Anesthesia Preprocedure Evaluation (Signed)
Anesthesia Evaluation  Patient identified by MRN, date of birth, ID band Patient awake    Reviewed: Allergy & Precautions, NPO status , Patient's Chart, lab work & pertinent test results  Airway Mallampati: II  TM Distance: >3 FB Neck ROM: Full    Dental  (+) Teeth Intact, Dental Advisory Given   Pulmonary neg pulmonary ROS,    Pulmonary exam normal breath sounds clear to auscultation       Cardiovascular Exercise Tolerance: Good negative cardio ROS Normal cardiovascular exam Rhythm:Regular Rate:Normal     Neuro/Psych negative neurological ROS     GI/Hepatic negative GI ROS, Neg liver ROS,   Endo/Other  Obesity   Renal/GU negative Renal ROS     Musculoskeletal negative musculoskeletal ROS (+)   Abdominal   Peds  Hematology negative hematology ROS (+) Plt 241k   Anesthesia Other Findings Day of surgery medications reviewed with the patient.  Reproductive/Obstetrics (+) Pregnancy                             Anesthesia Physical Anesthesia Plan  ASA: II  Anesthesia Plan: Epidural   Post-op Pain Management:    Induction:   Airway Management Planned:   Additional Equipment:   Intra-op Plan:   Post-operative Plan:   Informed Consent: I have reviewed the patients History and Physical, chart, labs and discussed the procedure including the risks, benefits and alternatives for the proposed anesthesia with the patient or authorized representative who has indicated his/her understanding and acceptance.   Dental advisory given  Plan Discussed with:   Anesthesia Plan Comments: (Patient identified. Risks/Benefits/Options discussed with patient including but not limited to bleeding, infection, nerve damage, paralysis, failed block, incomplete pain control, headache, blood pressure changes, nausea, vomiting, reactions to medication both or allergic, itching and postpartum back pain.  Confirmed with bedside nurse the patient's most recent platelet count. Confirmed with patient that they are not currently taking any anticoagulation, have any bleeding history or any family history of bleeding disorders. Patient expressed understanding and wished to proceed. All questions were answered. )        Anesthesia Quick Evaluation

## 2016-11-10 NOTE — Anesthesia Procedure Notes (Signed)
Epidural Patient location during procedure: OB Start time: 11/10/2016 3:25 PM End time: 11/10/2016 3:30 PM  Staffing Anesthesiologist: Cecile HearingURK, STEPHEN EDWARD Performed: anesthesiologist   Preanesthetic Checklist Completed: patient identified, pre-op evaluation, timeout performed, IV checked, risks and benefits discussed and monitors and equipment checked  Epidural Patient position: sitting Prep: DuraPrep Patient monitoring: blood pressure and continuous pulse ox Approach: midline Location: L3-L4 Injection technique: LOR air  Needle:  Needle type: Tuohy  Needle gauge: 17 G Needle length: 9 cm Needle insertion depth: 5 cm Catheter size: 19 Gauge Catheter at skin depth: 10 cm Test dose: negative and Other (1% Lidocaine)  Additional Notes Patient identified.  Risk benefits discussed including failed block, incomplete pain control, headache, nerve damage, paralysis, blood pressure changes, nausea, vomiting, reactions to medication both toxic or allergic, and postpartum back pain.  Patient expressed understanding and wished to proceed.  All questions were answered.  Sterile technique used throughout procedure and epidural site dressed with sterile barrier dressing. No paresthesia or other complications noted. The patient did not experience any signs of intravascular injection such as tinnitus or metallic taste in mouth nor signs of intrathecal spread such as rapid motor block. Please see nursing notes for vital signs. Reason for block:procedure for pain

## 2016-11-11 ENCOUNTER — Encounter (HOSPITAL_COMMUNITY): Payer: Self-pay

## 2016-11-11 LAB — CBC
HEMATOCRIT: 33.5 % — AB (ref 36.0–46.0)
Hemoglobin: 11.4 g/dL — ABNORMAL LOW (ref 12.0–15.0)
MCH: 30.1 pg (ref 26.0–34.0)
MCHC: 34 g/dL (ref 30.0–36.0)
MCV: 88.4 fL (ref 78.0–100.0)
Platelets: 229 10*3/uL (ref 150–400)
RBC: 3.79 MIL/uL — AB (ref 3.87–5.11)
RDW: 15.6 % — AB (ref 11.5–15.5)
WBC: 15.3 10*3/uL — AB (ref 4.0–10.5)

## 2016-11-11 LAB — RPR: RPR Ser Ql: NONREACTIVE

## 2016-11-11 MED ORDER — WITCH HAZEL-GLYCERIN EX PADS: 1.0000 "application " | MEDICATED_PAD | CUTANEOUS | Status: DC | PRN

## 2016-11-11 MED ORDER — DIBUCAINE 1 % RE OINT: 1.0000 "application " | TOPICAL_OINTMENT | RECTAL | Status: DC | PRN

## 2016-11-11 MED ORDER — BENZOCAINE-MENTHOL 20-0.5 % EX AERO: 1.0000 "application " | INHALATION_SPRAY | CUTANEOUS | Status: DC | PRN

## 2016-11-11 MED ORDER — SIMETHICONE 80 MG PO CHEW: 80.0000 mg | CHEWABLE_TABLET | ORAL | Status: DC | PRN

## 2016-11-11 MED ORDER — ACETAMINOPHEN 325 MG PO TABS
650.0000 mg | ORAL_TABLET | ORAL | Status: DC | PRN
Start: 2016-11-11 — End: 2016-11-12
  Administered 2016-11-11: 650 mg via ORAL
  Filled 2016-11-11: qty 2

## 2016-11-11 MED ORDER — MEASLES, MUMPS & RUBELLA VAC ~~LOC~~ INJ
0.5000 mL | INJECTION | Freq: Once | SUBCUTANEOUS | Status: DC
  Filled 2016-11-11: qty 0.5

## 2016-11-11 MED ORDER — DIPHENHYDRAMINE HCL 25 MG PO CAPS: 25.0000 mg | ORAL_CAPSULE | Freq: Four times a day (QID) | ORAL | Status: DC | PRN

## 2016-11-11 MED ORDER — TETANUS-DIPHTH-ACELL PERTUSSIS 5-2.5-18.5 LF-MCG/0.5 IM SUSP: 0.5000 mL | Freq: Once | INTRAMUSCULAR | Status: DC

## 2016-11-11 MED ORDER — BISACODYL 10 MG RE SUPP
10.0000 mg | Freq: Every day | RECTAL | Status: DC | PRN
Start: 2016-11-11 — End: 2016-11-12

## 2016-11-11 MED ORDER — ONDANSETRON HCL 4 MG PO TABS: 4.0000 mg | ORAL_TABLET | ORAL | Status: DC | PRN

## 2016-11-11 MED ORDER — ONDANSETRON HCL 4 MG/2ML IJ SOLN: 4.0000 mg | INTRAMUSCULAR | Status: DC | PRN

## 2016-11-11 MED ORDER — FLEET ENEMA 7-19 GM/118ML RE ENEM: 1.0000 | ENEMA | Freq: Every day | RECTAL | Status: DC | PRN

## 2016-11-11 MED ORDER — SENNOSIDES-DOCUSATE SODIUM 8.6-50 MG PO TABS
2.0000 | ORAL_TABLET | ORAL | Status: DC
  Administered 2016-11-11 (×2): 2 via ORAL
  Filled 2016-11-11 (×2): qty 2

## 2016-11-11 MED ORDER — OXYCODONE-ACETAMINOPHEN 5-325 MG PO TABS
1.0000 | ORAL_TABLET | ORAL | Status: DC | PRN
  Administered 2016-11-11 – 2016-11-12 (×2): 1 via ORAL
  Filled 2016-11-11 (×2): qty 1

## 2016-11-11 MED ORDER — MEDROXYPROGESTERONE ACETATE 150 MG/ML IM SUSP: 150.0000 mg | INTRAMUSCULAR | Status: DC | PRN

## 2016-11-11 MED ORDER — OXYCODONE-ACETAMINOPHEN 5-325 MG PO TABS: 2.0000 | ORAL_TABLET | ORAL | Status: DC | PRN

## 2016-11-11 MED ORDER — PRENATAL MULTIVITAMIN CH
1.0000 | ORAL_TABLET | Freq: Every day | ORAL | Status: DC
  Administered 2016-11-12: 1 via ORAL
  Filled 2016-11-11 (×2): qty 1

## 2016-11-11 MED ORDER — COCONUT OIL OIL
1.0000 "application " | TOPICAL_OIL | Status: DC | PRN
  Filled 2016-11-11: qty 120

## 2016-11-11 MED ORDER — IBUPROFEN 600 MG PO TABS
600.0000 mg | ORAL_TABLET | Freq: Four times a day (QID) | ORAL | Status: DC
  Administered 2016-11-11 – 2016-11-12 (×6): 600 mg via ORAL
  Filled 2016-11-11 (×7): qty 1

## 2016-11-11 MED ORDER — ZOLPIDEM TARTRATE 5 MG PO TABS
5.0000 mg | ORAL_TABLET | Freq: Every evening | ORAL | Status: DC | PRN
Start: 2016-11-11 — End: 2016-11-12

## 2016-11-11 NOTE — Lactation Note (Signed)
This note was copied from a baby's chart. Lactation Consultation Note  Patient Name: Kelsey [name]: 11/11/2016 Reason for consult: Initial assessment   Initial consult with Exp BF mom of 16 hour old infant. Mom reports she BF her 5818, 2613, and 909 yo and noted that her supply dropped when she returned to work. She reports she returned to work early. She was pumping. Mom asked about Lactation cookies, recipe handout given. Mom asking about herbs and teas, gave her info on Mother's Milk Tea, Fenugreek, and Mother Love More milk plus. Mom reports she did ask for formula earlier due to nipple soreness but she has not used it.   Infant currently asleep in visitors arms. Mom reports BF is painful to her and that pain is throughout feedings. Nipples intact and without bruising. Mom has a hand pump at bedside that she has been using before latching. Breast shells given for use to right nipple that is inverted. Comfort gels given with instructions for use and cleaning. Mom reports the comfort gels were soothing and felt good. She has coconut oil at bedside, cautioned against coconut oil while using comfort gels.   Left LC phone # on white board and asked mom to call out for next feeding for feeding assessment.   BF Resources Handout and LC Brochure given, mom informed of IP/OP Services, BF Support Groups and LC phone #. Enc mom to call for questions/concerns. Mom has a Medela PIS ordered for home use.    Maternal Data Formula Feeding for Exclusion: No Has patient been taught Hand Expression?: Yes Does the patient have breastfeeding experience prior to this delivery?: Yes  Feeding Feeding Type: Breast Fed Length of feed: 10 min  LATCH Score/Interventions       Type of Nipple: Inverted (right inverted, left everted) Intervention(s): Shells;Hand pump  Comfort (Breast/Nipple): Filling, red/small blisters or bruises, mild/mod discomfort  Problem noted: Mild/Moderate  discomfort Interventions  (Cracked/bleeding/bruising/blister): Expressed breast milk to nipple Interventions (Mild/moderate discomfort): Comfort gels        Lactation Tools Discussed/Used WIC Program: No   Consult Status Consult Status: Follow-up [name]: 11/12/16 Follow-up type: In-patient    Silas FloodSharon S Dyami Umbach 11/11/2016, 1:55 PM

## 2016-11-11 NOTE — Lactation Note (Signed)
This note was copied from a baby's chart. Lactation Consultation Note  Patient Name: Kelsey Sharmon LeydenBonnie Beaupre ZOXWR'UToday's Date: 11/11/2016 Reason for consult: Follow-up assessment   Baby 19 hours old. Mom reports that she has sore nipples and would like for LC to see a latch--but she forgot to call out with the last feeding. Mom states that she does not think the baby is getting a deep enough latch. Mom has this LC's extension and enc to call with next feeding.  Maternal Data Formula Feeding for Exclusion: No Has patient been taught Hand Expression?: Yes Does the patient have breastfeeding experience prior to this delivery?: Yes  Feeding    LATCH Score/Interventions       Type of Nipple: Inverted (right inverted, left everted) Intervention(s): Shells;Hand pump  Comfort (Breast/Nipple): Filling, red/small blisters or bruises, mild/mod discomfort  Problem noted: Mild/Moderate discomfort Interventions  (Cracked/bleeding/bruising/blister): Expressed breast milk to nipple Interventions (Mild/moderate discomfort): Comfort gels        Lactation Tools Discussed/Used WIC Program: No   Consult Status Consult Status: Follow-up Date: 11/11/16 Follow-up type: In-patient    Sherlyn HayJennifer D Jaeson Molstad 11/11/2016, 4:42 PM

## 2016-11-11 NOTE — Anesthesia Postprocedure Evaluation (Signed)
Anesthesia Post Note  Patient: Kelsey HectorBonnie V Delao  Procedure(s) Performed: * No procedures listed *  Patient location during evaluation: Mother Baby Anesthesia Type: Epidural Level of consciousness: awake and alert and oriented Pain management: pain level controlled Vital Signs Assessment: post-procedure vital signs reviewed and stable Respiratory status: spontaneous breathing and nonlabored ventilation Cardiovascular status: stable Postop Assessment: no headache, patient able to bend at knees, no backache, no signs of nausea or vomiting, epidural receding and adequate PO intake Anesthetic complications: no        Last Vitals:  Vitals:   11/11/16 0100 11/11/16 0500  BP: (!) 105/56 (!) 107/53  Pulse: 89 69  Resp: 18 18  Temp: 36.7 C 36.9 C    Last Pain:  Vitals:   11/10/16 1920  TempSrc:   PainSc: 8    Pain Goal:                 Land O'LakesMalinova,Tali Cleaves Hristova

## 2016-11-11 NOTE — Progress Notes (Signed)
Patient doing well.  No complaints.  BP (!) 107/53   Pulse 69   Temp 98.4 F (36.9 C)   Resp 18   Ht 5\' 2"  (1.575 m)   Wt 93 kg (205 lb)   Breastfeeding? Unknown   BMI 37.49 kg/m  Results for orders placed or performed during the hospital encounter of 11/10/16 (from the past 24 hour(s))  Urinalysis, Routine w reflex microscopic     Status: None   Collection Time: 11/10/16  8:40 AM  Result Value Ref Range   Color, Urine YELLOW YELLOW   APPearance CLEAR CLEAR   Specific Gravity, Urine 1.018 1.005 - 1.030   pH 6.0 5.0 - 8.0   Glucose, UA NEGATIVE NEGATIVE mg/dL   Hgb urine dipstick NEGATIVE NEGATIVE   Bilirubin Urine NEGATIVE NEGATIVE   Ketones, ur NEGATIVE NEGATIVE mg/dL   Protein, ur NEGATIVE NEGATIVE mg/dL   Nitrite NEGATIVE NEGATIVE   Leukocytes, UA NEGATIVE NEGATIVE  CBC     Status: Abnormal   Collection Time: 11/10/16  9:45 AM  Result Value Ref Range   WBC 9.4 4.0 - 10.5 K/uL   RBC 3.94 3.87 - 5.11 MIL/uL   Hemoglobin 12.2 12.0 - 15.0 g/dL   HCT 16.134.6 (L) 09.636.0 - 04.546.0 %   MCV 87.8 78.0 - 100.0 fL   MCH 31.0 26.0 - 34.0 pg   MCHC 35.3 30.0 - 36.0 g/dL   RDW 40.915.3 81.111.5 - 91.415.5 %   Platelets 241 150 - 400 K/uL  RPR     Status: None   Collection Time: 11/10/16  9:45 AM  Result Value Ref Range   RPR Ser Ql Non Reactive Non Reactive  Type and screen [facility] [place]     Status: None   Collection Time: 11/10/16  9:45 AM  Result Value Ref Range   ABO/RH(D) B POS    Antibody Screen NEG    Sample Expiration 11/13/2016   ABO/Rh     Status: None   Collection Time: 11/10/16  9:45 AM  Result Value Ref Range   ABO/RH(D) B POS   CBC     Status: Abnormal   Collection Time: 11/11/16  5:51 AM  Result Value Ref Range   WBC 15.3 (H) 4.0 - 10.5 K/uL   RBC 3.79 (L) 3.87 - 5.11 MIL/uL   Hemoglobin 11.4 (L) 12.0 - 15.0 g/dL   HCT 78.233.5 (L) 95.636.0 - 21.346.0 %   MCV 88.4 78.0 - 100.0 fL   MCH 30.1 26.0 - 34.0 pg   MCHC 34.0 30.0 - 36.0 g/dL   RDW 08.615.6 (H) 57.811.5 - 46.915.5 %   Platelets 229 150 - 400 K/uL   Abdomen is soft and non  Tender  Impression: PPD # 1 Doing well Routine care

## 2016-11-12 NOTE — Discharge Summary (Signed)
Obstetric Discharge Summary Reason for Admission: rupture of membranes Prenatal Procedures: none Intrapartum Procedures: spontaneous vaginal delivery Postpartum Procedures: none Complications-Operative and Postpartum: none Hemoglobin  Date Value Ref Range Status  11/11/2016 11.4 (L) 12.0 - 15.0 g/dL Final   HCT  Date Value Ref Range Status  11/11/2016 33.5 (L) 36.0 - 46.0 % Final    Physical Exam:  General: alert, cooperative and appears stated age 60Lochia: appropriate Uterine Fundus: firm Incision: healing well, no significant drainage, no dehiscence DVT Evaluation: No evidence of DVT seen on physical exam.  Discharge Diagnoses: Term Pregnancy-delivered  Discharge Information: Date: 11/12/2016 Activity: pelvic rest Diet: routine Medications: None Condition: improved Instructions: refer to practice specific booklet Discharge to: home   Newborn Data: Live born female  Birth Weight: 8 lb 0.8 oz (3650 g) APGAR: 8, 9  Home with mother.  Havana Baldwin L 11/12/2016, 6:58 AM

## 2016-11-17 ENCOUNTER — Inpatient Hospital Stay (HOSPITAL_COMMUNITY): Admission: RE | Admit: 2016-11-17 | Payer: BLUE CROSS/BLUE SHIELD | Source: Ambulatory Visit

## 2018-02-18 ENCOUNTER — Telehealth: Payer: Self-pay | Admitting: General Practice

## 2018-02-18 NOTE — Telephone Encounter (Signed)
Called pt and LVM regarding her request to establish care. Informed that we have 4 providers accepting new pts and advised to call back and schedule a new pt visit.

## 2019-10-22 DIAGNOSIS — Z20828 Contact with and (suspected) exposure to other viral communicable diseases: Secondary | ICD-10-CM | POA: Diagnosis not present

## 2019-10-22 DIAGNOSIS — Z03818 Encounter for observation for suspected exposure to other biological agents ruled out: Secondary | ICD-10-CM | POA: Diagnosis not present

## 2020-06-02 DIAGNOSIS — Z1231 Encounter for screening mammogram for malignant neoplasm of breast: Secondary | ICD-10-CM | POA: Diagnosis not present

## 2020-06-07 DIAGNOSIS — Z6832 Body mass index (BMI) 32.0-32.9, adult: Secondary | ICD-10-CM | POA: Diagnosis not present

## 2020-06-07 DIAGNOSIS — Z01419 Encounter for gynecological examination (general) (routine) without abnormal findings: Secondary | ICD-10-CM | POA: Diagnosis not present

## 2020-08-31 ENCOUNTER — Ambulatory Visit: Payer: Self-pay | Attending: Internal Medicine

## 2020-08-31 DIAGNOSIS — Z23 Encounter for immunization: Secondary | ICD-10-CM

## 2020-08-31 NOTE — Progress Notes (Signed)
   Covid-19 Vaccination Clinic  Name:  MAKHIYA COBURN    MRN: [id number] DOB: 1975/09/27  08/31/2020  Ms. Schauer was observed post Covid-19 immunization for 15 minutes without incident. She was provided with Vaccine Information Sheet and instruction to access the V-Safe system.   Ms. KINNAMON was instructed to call 911 with any severe reactions post vaccine: Marland Kitchen Difficulty breathing  . Swelling of face and throat  . A fast heartbeat  . A bad rash all over body  . Dizziness and weakness   Immunizations Administered    Name Date Dose VIS Date Route   Pfizer COVID-19 Vaccine 08/31/2020  4:41 PM 0.3 mL 07/07/2020 Intramuscular   Manufacturer: [employer], [employer]   Lot: [id number]   NDC: [id number]

## 2021-05-30 ENCOUNTER — Telehealth: Payer: Self-pay

## 2021-05-30 NOTE — Telephone Encounter (Signed)
LVM to schedule a NP at

## 2021-05-30 NOTE — Telephone Encounter (Signed)
Patient would like EST care with dr.parker patients husband is a current patient Kelsey [name] )   Please advise  And call patient  [phone] (M)

## 2021-05-30 NOTE — Telephone Encounter (Signed)
Ok with Dr [name] to schedule NP visit

## 2021-05-30 NOTE — Telephone Encounter (Signed)
See below

## 2021-05-30 NOTE — Telephone Encounter (Signed)
Ok for her to schedule a new patient visit.  Kelsey [name]. [name], MD 05/30/2021 10:44 AM

## 2021-06-06 DIAGNOSIS — H18593 Other hereditary corneal dystrophies, bilateral: Secondary | ICD-10-CM | POA: Diagnosis not present

## 2021-07-11 DIAGNOSIS — Z6832 Body mass index (BMI) 32.0-32.9, adult: Secondary | ICD-10-CM | POA: Diagnosis not present

## 2021-07-11 DIAGNOSIS — Z01419 Encounter for gynecological examination (general) (routine) without abnormal findings: Secondary | ICD-10-CM | POA: Diagnosis not present

## 2021-07-11 DIAGNOSIS — Z1231 Encounter for screening mammogram for malignant neoplasm of breast: Secondary | ICD-10-CM | POA: Diagnosis not present

## 2021-07-11 LAB — RESULTS CONSOLE HPV: CHL HPV: NEGATIVE

## 2021-07-11 LAB — HM PAP SMEAR

## 2021-07-14 ENCOUNTER — Other Ambulatory Visit: Payer: Self-pay | Admitting: Obstetrics and Gynecology

## 2021-07-14 DIAGNOSIS — R928 Other abnormal and inconclusive findings on diagnostic imaging of breast: Secondary | ICD-10-CM

## 2021-07-20 DIAGNOSIS — N870 Mild cervical dysplasia: Secondary | ICD-10-CM | POA: Diagnosis not present

## 2021-07-20 DIAGNOSIS — R87611 Atypical squamous cells cannot exclude high grade squamous intraepithelial lesion on cytologic smear of cervix (ASC-H): Secondary | ICD-10-CM | POA: Diagnosis not present

## 2021-07-20 DIAGNOSIS — N879 Dysplasia of cervix uteri, unspecified: Secondary | ICD-10-CM | POA: Diagnosis not present

## 2021-07-28 ENCOUNTER — Other Ambulatory Visit: Payer: Self-pay

## 2021-07-28 ENCOUNTER — Encounter: Payer: Self-pay | Admitting: Family Medicine

## 2021-07-28 ENCOUNTER — Ambulatory Visit: Payer: BC Managed Care – PPO | Admitting: Family Medicine

## 2021-07-28 VITALS — BP 113/76 | HR 64 | Temp 98.0°F | Ht 62.0 in

## 2021-07-28 DIAGNOSIS — M542 Cervicalgia: Secondary | ICD-10-CM | POA: Insufficient documentation

## 2021-07-28 DIAGNOSIS — G4700 Insomnia, unspecified: Secondary | ICD-10-CM

## 2021-07-28 DIAGNOSIS — Z0001 Encounter for general adult medical examination with abnormal findings: Secondary | ICD-10-CM | POA: Diagnosis not present

## 2021-07-28 DIAGNOSIS — F419 Anxiety disorder, unspecified: Secondary | ICD-10-CM

## 2021-07-28 DIAGNOSIS — E669 Obesity, unspecified: Secondary | ICD-10-CM

## 2021-07-28 DIAGNOSIS — Z6837 Body mass index (BMI) 37.0-37.9, adult: Secondary | ICD-10-CM

## 2021-07-28 DIAGNOSIS — R519 Headache, unspecified: Secondary | ICD-10-CM | POA: Diagnosis not present

## 2021-07-28 MED ORDER — AMITRIPTYLINE HCL 10 MG PO TABS: 10.0000 mg | ORAL_TABLET | Freq: Every day | ORAL | 3 refills | Status: DC

## 2021-07-28 NOTE — Patient Instructions (Signed)
It was very nice to see you today!  We we will check an x-ray of your neck.  Please work on the exercises at home.  Let me know if not improving.  We will start amitriptyline.  This should help you with your sleep and stress levels.  It will also help prevent headaches or migraines.  Please send me a message in a couple of weeks to let me know how this is working for you.  We will be starting a low-dose we can increase as needed.  We will see back in 1 year for your next physical.  Take care, Dr Jerline Pain  PLEASE NOTE:  If you had any lab tests please let us know if you have not heard back within a few days. You may see your results on mychart before we have a chance to review them but we will give you a call once they are reviewed by [place]. If we ordered any referrals today, please let us know if you have not heard from their office within the next week.   Please try these tips to maintain a healthy lifestyle:  Eat at least 3 REAL meals and 1-2 snacks per day.  Aim for no more than 5 hours between eating.  If you eat breakfast, please do so within one hour of getting up.   Each meal should contain half fruits/vegetables, one quarter protein, and one quarter carbs (no bigger than a computer mouse)  Cut down on sweet beverages. This includes juice, soda, and sweet tea.   Drink at least 1 glass of water with each meal and aim for at least 8 glasses per day  Exercise at least 150 minutes every week.    Preventive Care 4-66 Years Old, Female Preventive care refers to lifestyle choices and visits with your health care provider that can promote health and wellness. Preventive care visits are also called wellness exams. What can I expect for my preventive care visit? Counseling Your health care provider may ask you questions about your: Medical history, including: Past medical problems. Family medical history. Pregnancy history. Current health, including: Menstrual cycle. Method of birth  control. Emotional well-being. Home life and relationship well-being. Sexual activity and sexual health. Lifestyle, including: Alcohol, nicotine or tobacco, and drug use. Access to firearms. Diet, exercise, and sleep habits. Work and work Statistician. Sunscreen use. Safety issues such as seatbelt and bike helmet use. Physical exam Your health care provider will check your: Height and weight. These may be used to calculate your BMI (body mass index). BMI is a measurement that tells if you are at a healthy weight. Waist circumference. This measures the distance around your waistline. This measurement also tells if you are at a healthy weight and may help predict your risk of certain diseases, such as type 2 diabetes and high blood pressure. Heart rate and blood pressure. Body temperature. Skin for abnormal spots. What immunizations do I need? Vaccines are usually given at various ages, according to a schedule. Your health care provider will recommend vaccines for you based on your age, medical history, and lifestyle or other factors, such as travel or where you work. What tests do I need? Screening Your health care provider may recommend screening tests for certain conditions. This may include: Lipid and cholesterol levels. Diabetes screening. This is done by checking your blood sugar (glucose) after you have not eaten for a while (fasting). Pelvic exam and Pap test. Hepatitis B test. Hepatitis C test. HIV (human immunodeficiency virus)  test. STI (sexually transmitted infection) testing, if you are at risk. Lung cancer screening. Colorectal cancer screening. Mammogram. Talk with your health care provider about when you should start having regular mammograms. This may depend on whether you have a family history of breast cancer. BRCA-related cancer screening. This may be done if you have a family history of breast, ovarian, tubal, or peritoneal cancers. Bone density scan. This is done  to screen for osteoporosis. Talk with your health care provider about your test results, treatment options, and if necessary, the need for more tests. Follow these instructions at home: Eating and drinking  Eat a diet that includes fresh fruits and vegetables, whole grains, lean protein, and low-fat dairy products. Take vitamin and mineral supplements as recommended by your health care provider. Do not drink alcohol if: Your health care provider tells you not to drink. You are pregnant, may be pregnant, or are planning to become pregnant. If you drink alcohol: Limit how much you have to 0-1 drink a day. Know how much alcohol is in your drink. In the [place] one drink equals one 12 oz bottle of beer (355 mL), one 5 oz glass of wine (148 mL), or one 1 oz glass of hard liquor (44 mL). Lifestyle Brush your teeth every morning and night with fluoride toothpaste. Floss one time each day. Exercise for at least 30 minutes 5 or more days each week. Do not use any products that contain nicotine or tobacco. These products include cigarettes, chewing tobacco, and vaping devices, such as e-cigarettes. If you need help quitting, ask your health care provider. Do not use drugs. If you are sexually active, practice safe sex. Use a condom or other form of protection to prevent STIs. If you do not wish to become pregnant, use a form of birth control. If you plan to become pregnant, see your health care provider for a prepregnancy visit. Take aspirin only as told by your health care provider. Make sure that you understand how much to take and what form to take. Work with your health care provider to find out whether it is safe and beneficial for you to take aspirin daily. Find healthy ways to manage stress, such as: Meditation, yoga, or listening to music. Journaling. Talking to a trusted person. Spending time with friends and family. Minimize exposure to UV radiation to reduce your risk of skin  cancer. Safety Always wear your seat belt while driving or riding in a vehicle. Do not drive: If you have been drinking alcohol. Do not ride with someone who has been drinking. When you are tired or distracted. While texting. If you have been using any mind-altering substances or drugs. Wear a helmet and other protective equipment during sports activities. If you have firearms in your house, make sure you follow all gun safety procedures. Seek help if you have been physically or sexually abused. What's next? Visit your health care provider once a year for an annual wellness visit. Ask your health care provider how often you should have your eyes and teeth checked. Stay up to date on all vaccines. This information is not intended to replace advice given to you by your health care provider. Make sure you discuss any questions you have with your health care provider. Document Revised: 03/02/2021 Document Reviewed: 03/02/2021 Elsevier Patient Education  [place].

## 2021-07-28 NOTE — Assessment & Plan Note (Signed)
Under a lot of caregiver burden and stress.  She has been on Lexapro in the past.  This did not help control anxiety however it made her feel too blunted.  We discussed treatment options including therapy and medications.  She is possibly interested in medication at this point.  We will start low-dose amitriptyline given her concurrent issues with chronic headache and insomnia.  She will check with me in a couple weeks via MyChart and we can adjust the dose as needed.

## 2021-07-28 NOTE — Assessment & Plan Note (Signed)
Likely related to her anxiety and stress.  We will be starting low-dose amitriptyline.  Discussed sleep hygiene measures.  She will follow-up with me in a few weeks via MyChart.  We can titrate dose of amitriptyline as needed.

## 2021-07-28 NOTE — Assessment & Plan Note (Signed)
No ref flags and reassuring exam however given length of symptoms We will check plain film to further evaluate.  Discussed home exercises and handout was given.  May consider referral to PT if this continues to be an issue.

## 2021-07-28 NOTE — Progress Notes (Signed)
Chief Complaint:  Kelsey [name] is a 45 y.o. female who presents today for her annual comprehensive physical exam.    Assessment/Plan:  Chronic Problems Addressed Today: Insomnia Likely related to her anxiety and stress.  We will be starting low-dose amitriptyline.  Discussed sleep hygiene measures.  She will follow-up with me in a few weeks via MyChart.  We can titrate dose of amitriptyline as needed.  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  She is also under a lot of stress and anxiety which could be contributing as well.  Discussed home exercise program for her neck pain.  We will also be starting amitriptyline as above.  She will check in with me in a couple of weeks via MyChart.  Neck pain No ref flags and reassuring exam however given length of symptoms We will check plain film to further evaluate.  Discussed home exercises and handout was given.  May consider referral to PT if this continues to be an issue.   Anxiety Under a lot of caregiver burden and stress.  She has been on Lexapro in the past.  This did not help control anxiety however it made her feel too blunted.  We discussed treatment options including therapy and medications.  She is possibly interested in medication at this point.  We will start low-dose amitriptyline given her concurrent issues with chronic headache and insomnia.  She will check with me in a couple weeks via MyChart and we can adjust the dose as needed.  Body mass index is 37.49 kg/m. / Obese  BMI Metric Follow Up - 07/28/21 1452       BMI Metric Follow Up-Please document annually   BMI Metric Follow Up Education provided             Preventative Healthcare: UTD on mammogram, pap, and lipid screening.   Patient Counseling(The following topics were reviewed and/or handout was given):  -Nutrition: Stressed importance of moderation in sodium/caffeine intake, saturated fat and cholesterol, caloric balance, sufficient intake of fresh  fruits, vegetables, and fiber.  -Stressed the importance of regular exercise.   -Substance Abuse: Discussed cessation/primary prevention of tobacco, alcohol, or other drug use; driving or other dangerous activities under the influence; availability of treatment for abuse.   -Injury prevention: Discussed safety belts, safety helmets, smoke detector, smoking near bedding or upholstery.   -Sexuality: Discussed sexually transmitted diseases, partner selection, use of condoms, avoidance of unintended pregnancy and contraceptive alternatives.   -Dental health: Discussed importance of regular tooth brushing, flossing, and dental visits.  -Health maintenance and immunizations reviewed. Please refer to Health maintenance section.  Return to care in 1 year for next preventative visit.     Subjective:  HPI:  She has no acute complaints today.   She is here with headaches which started 6 months ago. She reports headaches are located in the back of the head. This issue is getting worse. She relate her headaches to tension and stress.She tried Advil which gave her mild relief. She denies any injuries or precipitating events. No swelling or pain in legs. No weakness or numbness.   She is under a lot of stress. She is taking care of her parents. Her father was recently diagnosed with dementia. 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 She admit to not getting enough sleep at night. She sleep only 6-7 hours a day. She also has some swelling in her hands.  Lifestyle Diet: Trying to follow a healthy diet. Exercise: None specific.  Depression  screen PHQ 2/9 07/28/2021  Decreased Interest 0  Down, Depressed, Hopeless 0  PHQ - 2 Score 0    Health Maintenance Due  Topic Date Due   Hepatitis C Screening  Never done   TETANUS/TDAP  Never done   PAP SMEAR-Modifier  03/10/2018   COVID-19 Vaccine (2 - Pfizer series) 09/21/2020     ROS: Per HPI, otherwise a complete review of systems was negative.    PMH:  The following were reviewed and entered/updated in epic: Past Medical History:  Diagnosis Date   Hypothyroidism    Patient Active Problem List   Diagnosis Date Noted   Anxiety 07/28/2021   Neck pain 07/28/2021   Nonintractable headache 07/28/2021   Insomnia 07/28/2021   Past Surgical History:  Procedure Laterality Date   APPENDECTOMY     DILATION AND CURETTAGE OF UTERUS     LAPAROSCOPIC OVARIAN CYSTECTOMY      Family History  Problem Relation Age of Onset   Diabetes Mother    Hyperlipidemia Mother    Hypertension Mother    Heart disease Father    Hyperlipidemia Father    Hypertension Father    Diabetes Sister     Medications- reviewed and updated Current Outpatient Medications  Medication Sig Dispense Refill   amitriptyline (ELAVIL) 10 MG tablet Take 1 tablet (10 mg total) by mouth at bedtime. 90 tablet 3   Multiple Vitamin (MULTIVITAMIN) capsule Take 1 capsule by mouth daily.     No current facility-administered medications for this visit.    Allergies-reviewed and updated No Known Allergies  Social History   Socioeconomic History   Marital status: Married    Spouse name: Not on file   Number of children: Not on file   Years of education: Not on file   Highest education level: Not on file  Occupational History   Not on file  Tobacco Use   Smoking status: Never   Smokeless tobacco: Never  Substance and Sexual Activity   Alcohol use: No   Drug use: No   Sexual activity: Not on file  Other Topics Concern   Not on file  Social History Narrative   Not on file   Social Determinants of Health   Financial Resource Strain: Not on file  Food Insecurity: Not on file  Transportation Needs: Not on file  Physical Activity: Not on file  Stress: Not on file  Social Connections: Not on file        Objective:  Physical Exam: BP 113/76   Pulse 64   Temp 98 F (36.7 C) (Temporal)   Ht 5\' 2"  (1.575 m)   LMP 07/21/2021   SpO2 97%   BMI 37.49  kg/m   Body mass index is 37.49 kg/m. Wt Readings from Last 3 Encounters:  11/10/16 205 lb (93 kg)  07/06/13 146 lb (66.2 kg)   Gen: NAD, resting comfortably HEENT: TMs normal bilaterally. OP clear. No thyromegaly noted.  CV: RRR with no murmurs appreciated Pulm: NWOB, CTAB with no crackles, wheezes, or rhonchi GI: Normal bowel sounds present. Soft, Nontender, Nondistended. MSK: no edema, cyanosis, or clubbing noted - Neck: No deformities.  Nontender to palpation.  Full range of motion throughout. Skin: warm, dry Neuro: CN2-12 grossly intact. Strength 5/5 in upper and lower extremities. Reflexes symmetric and intact bilaterally.  Psych: Normal affect and thought content      I,Savera Zaman,acting as a scribe for 07/08/13, MD.,have documented all relevant documentation on the behalf of Jacquiline Doe, MD,as  directed by  Jacquiline Doe, MD while in the presence of Jacquiline Doe, MD.   I, Jacquiline Doe, MD, have reviewed all documentation for this visit. The documentation on 07/28/21 for the exam, diagnosis, procedures, and orders are all accurate and complete.  Katina Degree. [name], MD 07/28/2021 2:54 PM

## 2021-07-28 NOTE — Assessment & Plan Note (Signed)
Seems to be cervicogenic.  She is also under a lot of stress and anxiety which could be contributing as well.  Discussed home exercise program for her neck pain.  We will also be starting amitriptyline as above.  She will check in with me in a couple of weeks via MyChart.

## 2021-08-04 ENCOUNTER — Ambulatory Visit
Admission: RE | Admit: 2021-08-04 | Discharge: 2021-08-04 | Disposition: A | Discharge status: Home / Self Care | Payer: BC Managed Care – PPO | Source: Ambulatory Visit | Attending: Obstetrics and Gynecology | Admitting: Obstetrics and Gynecology

## 2021-08-04 ENCOUNTER — Other Ambulatory Visit: Payer: Self-pay

## 2021-08-04 ENCOUNTER — Ambulatory Visit
Admission: RE | Admit: 2021-08-04 | Discharge: 2021-08-04 | Disposition: A | Discharge status: Home / Self Care | Payer: Self-pay | Source: Ambulatory Visit | Attending: Obstetrics and Gynecology | Admitting: Obstetrics and Gynecology

## 2021-08-04 DIAGNOSIS — R928 Other abnormal and inconclusive findings on diagnostic imaging of breast: Secondary | ICD-10-CM

## 2021-08-04 DIAGNOSIS — N6489 Other specified disorders of breast: Secondary | ICD-10-CM | POA: Diagnosis not present

## 2021-08-04 DIAGNOSIS — R922 Inconclusive mammogram: Secondary | ICD-10-CM | POA: Diagnosis not present

## 2021-09-26 ENCOUNTER — Encounter: Payer: Self-pay | Admitting: Family Medicine

## 2021-11-21 ENCOUNTER — Ambulatory Visit: Payer: BC Managed Care – PPO | Admitting: Family Medicine

## 2021-11-21 VITALS — BP 108/75 | HR 74 | Temp 98.2°F | Ht 62.0 in | Wt 187.6 lb

## 2021-11-21 DIAGNOSIS — Z23 Encounter for immunization: Secondary | ICD-10-CM

## 2021-11-21 DIAGNOSIS — G4700 Insomnia, unspecified: Secondary | ICD-10-CM

## 2021-11-21 DIAGNOSIS — F419 Anxiety disorder, unspecified: Secondary | ICD-10-CM | POA: Diagnosis not present

## 2021-11-21 LAB — COMPREHENSIVE METABOLIC PANEL
ALT: 20 U/L (ref 0–35)
AST: 20 U/L (ref 0–37)
Albumin: 4.4 g/dL (ref 3.5–5.2)
Alkaline Phosphatase: 45 U/L (ref 39–117)
BUN: 14 mg/dL (ref 6–23)
CO2: 27 mEq/L (ref 19–32)
Calcium: 9.6 mg/dL (ref 8.4–10.5)
Chloride: 105 mEq/L (ref 96–112)
Creatinine, Ser: 0.8 mg/dL (ref 0.40–1.20)
GFR: 89.68 mL/min (ref >60.00)
Glucose, Bld: 91 mg/dL (ref 70–99)
Potassium: 4.2 mEq/L (ref 3.5–5.1)
Sodium: 142 mEq/L (ref 135–145)
Total Bilirubin: 0.9 mg/dL (ref 0.2–1.2)
Total Protein: 7.1 g/dL (ref 6.0–8.3)

## 2021-11-21 LAB — CBC
HCT: 37.4 % (ref 36.0–46.0)
Hemoglobin: 12.4 g/dL (ref 12.0–15.0)
MCHC: 33.1 g/dL (ref 30.0–36.0)
MCV: 90.6 fl (ref 78.0–100.0)
Platelets: 284 10*3/uL (ref 150.0–400.0)
RBC: 4.12 Mil/uL (ref 3.87–5.11)
RDW: 14.4 % (ref 11.5–15.5)
WBC: 6.5 10*3/uL (ref 4.0–10.5)

## 2021-11-21 LAB — TSH: TSH: 1.04 u[IU]/mL (ref 0.35–5.50)

## 2021-11-21 MED ORDER — TRAZODONE HCL 50 MG PO TABS: 25.0000 mg | ORAL_TABLET | Freq: Every evening | ORAL | 3 refills | Status: DC | PRN

## 2021-11-21 NOTE — Progress Notes (Signed)
? ?  Kelsey [name] is a 46 y.o. female who presents today for an office visit. ? ?Assessment/Plan:  ?New/Acute Problems: ?Palpitation ?No red flags. Likely PVCs.  She does not have any current symptoms.  We discussed avoidance of triggers.  She will let me know if she develops any new symptoms or if symptoms worsen.  Check labs today.  ? ?Chronic Problems Addressed Today: ?Insomnia ?Related to underlying anxiety and stress.  Did not tolerate amitriptyline.  We discussed alternatives.  We will start trazodone 25 to 50 mg daily.  She will follow-up in a couple of weeks MyChart.  If this does not work well would consider trial of gabapentin or doxepin.  She has been on Ambien in the past but would like to avoid this if possible. ? ?Anxiety ?She is under a lot of life stress which is also contributing to her anxiety.  We will be starting trazodone as above for insomnia.  Hopefully this should help some with her anxiety as well.  Could consider trial of doxepin or gabapentin in the future if she does not respond well to trazodone. ? ? ?  ?Subjective:  ?HPI: ? ?Patient here for follow-up.  We saw her for annual physical about 4 months ago.  Time was started on amitriptyline to help with insomnia.  She initially did well with this in terms of helping with her sleep however she had to stop it recently due to increasing issues with swelling and facial puffiness.  The symptoms have stopped since she stopped the Elavil.  Over the last several weeks she has had worsening issues with insomnia.  She falls asleep okay however wakes up in the night and has a hard time going back to sleep.  She feels a lot of buildup tension anxiety when she wakes up in middle the night.  This is happening most every night.  She feels excessively tired throughout the day. ? ?She is under a lot of stress recently due to caregiver burden with her husband.  She feels like this is manageable. ? ?   ?  ?Objective:  ?Physical Exam: ?BP 108/75 (BP  Location: Left Arm)   Pulse 74   Temp 98.2 ?F (36.8 ?C) (Temporal)   Ht 5\' 2"  (1.575 m)   Wt 187 lb 9.6 oz (85.1 kg)   SpO2 98%   BMI 34.31 kg/m?   ?Gen: No acute distress, resting comfortably ?CV: Regular rate and rhythm with no murmurs appreciated ?Pulm: Normal work of breathing, clear to auscultation bilaterally with no crackles, wheezes, or rhonchi ?Neuro: Grossly normal, moves all extremities ?Psych: Normal affect and thought content ? ?   ? ?Algis Greenhouse. Jerline Pain, MD ?11/21/2021 1:52 PM  ? ?

## 2021-11-21 NOTE — Assessment & Plan Note (Signed)
Related to underlying anxiety and stress.  Did not tolerate amitriptyline.  We discussed alternatives.  We will start trazodone 25 to 50 mg daily.  She will follow-up in a couple of weeks MyChart.  If this does not work well would consider trial of gabapentin or doxepin.  She has been on Ambien in the past but would like to avoid this if possible. ?

## 2021-11-21 NOTE — Patient Instructions (Signed)
It was very nice to see you today! ? ?Please try the trazodone to help with your sleep. ? ?We will check blood work. ? ?Sending message in 1 to 2 weeks to let me know how you are doing. ? ?We gave your tetanus vaccine today. ? ?Take care, ?Dr [name] ? ?PLEASE NOTE: ? ?If you had any lab tests please let us know if you have not heard back within a few days. You may see your results on mychart before we have a chance to review them but we will give you a call once they are reviewed by [place]. If we ordered any referrals today, please let us know if you have not heard from their office within the next week.  ? ?Please try these tips to maintain a healthy lifestyle: ? ?Eat at least 3 REAL meals and 1-2 snacks per day.  Aim for no more than 5 hours between eating.  If you eat breakfast, please do so within one hour of getting up.  ? ?Each meal should contain half fruits/vegetables, one quarter protein, and one quarter carbs (no bigger than a computer mouse) ? ?Cut down on sweet beverages. This includes juice, soda, and sweet tea.  ? ?Drink at least 1 glass of water with each meal and aim for at least 8 glasses per day ? ?Exercise at least 150 minutes every week.   ?

## 2021-11-21 NOTE — Assessment & Plan Note (Signed)
She is under a lot of life stress which is also contributing to her anxiety.  We will be starting trazodone as above for insomnia.  Hopefully this should help some with her anxiety as well.  Could consider trial of doxepin or gabapentin in the future if she does not respond well to trazodone. ?

## 2021-11-22 NOTE — Progress Notes (Signed)
Please inform patient of the following: ? ?Labs are all NORMAL. Would like for her to let us know if her symptoms are not improving. ? ?Algis Greenhouse. Jerline Pain, MD ?11/22/2021 9:31 AM  ?

## 2021-12-01 ENCOUNTER — Encounter: Payer: Self-pay | Admitting: Family Medicine

## 2021-12-01 NOTE — Telephone Encounter (Signed)
Please see note.

## 2021-12-05 ENCOUNTER — Encounter: Payer: Self-pay | Admitting: Physician Assistant

## 2021-12-05 ENCOUNTER — Ambulatory Visit: Payer: BC Managed Care – PPO | Admitting: Physician Assistant

## 2021-12-05 VITALS — Ht 62.0 in

## 2021-12-05 DIAGNOSIS — M545 Low back pain, unspecified: Secondary | ICD-10-CM | POA: Diagnosis not present

## 2021-12-05 DIAGNOSIS — K5900 Constipation, unspecified: Secondary | ICD-10-CM | POA: Diagnosis not present

## 2021-12-05 DIAGNOSIS — G8929 Other chronic pain: Secondary | ICD-10-CM | POA: Diagnosis not present

## 2021-12-05 MED ORDER — KETOROLAC TROMETHAMINE 60 MG/2ML IM SOLN
60.0000 mg | Freq: Once | INTRAMUSCULAR | Status: AC
  Administered 2021-12-05: 60 mg via INTRAMUSCULAR

## 2021-12-05 MED ORDER — KETOROLAC TROMETHAMINE 10 MG PO TABS: 10.0000 mg | ORAL_TABLET | Freq: Four times a day (QID) | ORAL | 0 refills | Status: DC | PRN

## 2021-12-05 MED ORDER — CYCLOBENZAPRINE HCL 10 MG PO TABS
10.0000 mg | ORAL_TABLET | Freq: Three times a day (TID) | ORAL | 0 refills | Status: DC | PRN
Start: 2021-12-05 — End: 2022-02-10

## 2021-12-05 MED ORDER — METHYLPREDNISOLONE ACETATE 80 MG/ML IJ SUSP
80.0000 mg | Freq: Once | INTRAMUSCULAR | Status: AC
  Administered 2021-12-05: 80 mg via INTRAMUSCULAR

## 2021-12-05 NOTE — Telephone Encounter (Signed)
I appreciate the update. I am glad the new mediation seems to be working well. ? ?We can try ozempic or wegovy to help with weight loss if she is interested. ? ?Algis Greenhouse. Jerline Pain, MD ?12/05/2021 12:39 PM  ? ?

## 2021-12-05 NOTE — Progress Notes (Signed)
Kelsey [name] is a 46 y.o. female here for lower back pain. ? ?History of Present Illness:  ? ?Chief Complaint  ?Patient presents with  ? Back Pain  ?  Pt stated that she started have lower back pain 2 days ago. She also lifts wt at the gym. Using Ibuprofen since yesterday and it did not help. 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  ? ?Lower Back Pain ?Kelsey [name] presents with c/o lower back pain that has been onset for two days. Pt describes her pain as back labor due to the stiffness that she has been feeling. Currently she reports she is in so much pain that she isn't sure how she got out of bed today. 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  ? ?In an effort to manage her pain she has tried applying ice and icy hot,  ibuprofen 600- 800 mg , and one of her sister's meloxicam but was provided with no relief. She does admit that she has noticed she hasn't been having as big of bowel movements that she is used to, but she has been moving her bowels daily. She has hx of constipation and isn't sure if this is contributing. ? ?Denies numbness/tingling, weakness in legs, urinary/bowel incontinence, urinary sx, concerns for pregnancy, or recent trauma/MVA.  ? ?Past Medical History:  ?Diagnosis Date  ? Hypothyroidism   ? ?  ?Social History  ? ?Tobacco Use  ? Smoking status: Never  ? Smokeless tobacco: Never  ?Substance Use Topics  ? Alcohol use: No  ? Drug use: No  ? ? ?Past Surgical History:  ?Procedure Laterality Date  ? APPENDECTOMY    ? DILATION AND CURETTAGE OF UTERUS    ? LAPAROSCOPIC OVARIAN CYSTECTOMY    ? ? ?Family History  ?Problem Relation Age of Onset  ? Diabetes Mother   ? Hyperlipidemia Mother   ? Hypertension Mother   ? Heart disease Father   ? Hyperlipidemia Father   ? Hypertension Father   ? Diabetes Sister   ? ? ?No Known Allergies ? ?Current Medications:  ? ?Current  Outpatient Medications:  ?  cyclobenzaprine (FLEXERIL) 10 MG tablet, Take 1 tablet (10 mg total) by mouth 3 (three) times daily as needed for muscle spasms., Disp: 30 tablet, Rfl: 0 ?  ketorolac (TORADOL) 10 MG tablet, Take 1 tablet (10 mg total) by mouth every 6 (six) hours as needed., Disp: 20 tablet, Rfl: 0 ?  Mag Oxide-Vit D3-Turmeric (MAGNESIUM-VITAMIN D3-TURMERIC) [phone] MG-UNIT-MG TABS, Take by mouth., Disp: , Rfl:  ?  Multiple Vitamin (MULTIVITAMIN) capsule, Take 1 capsule by mouth daily., Disp: , Rfl:  ?  traZODone (DESYREL) 50 MG tablet, Take 0.5-1 tablets (25-50 mg total) by mouth at bedtime as needed for sleep., Disp: 30 tablet, Rfl: 3  ? ?Review of Systems:  ? ?Review of Systems  ?Musculoskeletal:  Positive for back pain.  ?Negative unless otherwise specified per HPI. ?Vitals:  ? ?Vitals:  ? 12/05/21 1156  ?Height: 5\' 2"  (1.575 m)  ?   ?Body mass index is 34.31 kg/m?. ? ?Physical Exam:  ? ?Physical Exam ?Vitals and nursing note reviewed.  ?Constitutional:   ?   General: She is not in acute distress. ?   Appearance: She is well-developed. She is not ill-appearing or toxic-appearing.  ?Cardiovascular:  ?   Rate and Rhythm: Normal rate and  regular rhythm.  ?   Pulses: Normal pulses.  ?   Heart sounds: Normal heart sounds, S1 normal and S2 normal.  ?Pulmonary:  ?   Effort: Pulmonary effort is normal.  ?   Breath sounds: Normal breath sounds.  ?Musculoskeletal:  ?   Lumbar back: Spasms present.  ?   Comments: No decreased ROM 2/2 pain with flexion/extension, lateral side bends, or rotation. Reproducible tenderness with deep palpation to bilateral lumbar paraspinal muscles. No evidence of erythema, rash or ecchymosis. Negative STLR bilaterally. ? ?  ?Skin: ?   General: Skin is warm and dry.  ?Neurological:  ?   Mental Status: She is alert.  ?   GCS: GCS eye subscore is 4. GCS verbal subscore is 5. GCS motor subscore is 6.  ?Psychiatric:     ?   Speech: Speech normal.     ?   Behavior: Behavior normal.  Behavior is cooperative.  ? ? ?Assessment and Plan:  ? ?Lumbar Back Pain ?No red flags ?Administered Toradol 60 mg and Depo-Medrol 80 mg injection today, patient tolerated well  ?Start Toradol 10 mg every 6 hours as needed starting tomorrow and flexeril 10 mg three times daily  ?Trial stretching exercises for additional relief--provided handout information  ?Follow up if new/worsening symptoms or concerns occur  ? ?Constipation ?No red flags ?Trial 1 capful miralax daily x 1 week, then as needed  ?Encouraged patient to push more fluids  ?Follow up with PCP if new/worsening symptoms or concerns occur  ? ?I,Havlyn C Ratchford,acting as a scribe for [employer], PA.,have documented all relevant documentation on the behalf of Inda Coke, PA,as directed by  Inda Coke, PA while in the presence of Inda Coke, [place]. ? ?IInda Coke, PA, have reviewed all documentation for this visit. The documentation on 12/05/21 for the exam, diagnosis, procedures, and orders are all accurate and complete. ? ? ?Inda Coke, PA-C ? ?

## 2021-12-05 NOTE — Patient Instructions (Addendum)
It was great to see you! ? ?You are getting two injections today: toradol (pain) and depomedrol (steroid) ?May take ORAL toradol starting tomorrow ?Avoid ibuprofen, may take Tylenol if needed ? ?May take flexeril muscle relaxer as needed (this can make you drowsy) ? ?Miralax, 1 capful daily, for constipation -- trial this for a week or so to see if this helps you have regular, formed bowel movements -- follow-up with Dr. [name] if lack of improvement ? ?Contact a health care provider if: ?You have pain that is not relieved with rest or medicine. ?You have increasing pain going down into your legs or buttocks. ?Your pain does not improve after 2 weeks. ?You have pain at night. ?You lose weight without trying. ?You have a fever or chills. ?You develop nausea or vomiting. ?You develop abdominal pain. ?Get help right away if: ?You develop new bowel or bladder control problems. ?You have unusual weakness or numbness in your arms or legs. ?You feel faint. ? ?Take care, ? ?Jarold Motto PA-C  ?

## 2021-12-14 ENCOUNTER — Ambulatory Visit: Payer: BC Managed Care – PPO | Admitting: Family Medicine

## 2021-12-14 ENCOUNTER — Other Ambulatory Visit: Payer: Self-pay | Admitting: Family Medicine

## 2021-12-14 VITALS — BP 104/72 | HR 76 | Temp 98.0°F | Ht 62.0 in | Wt 186.8 lb

## 2021-12-14 DIAGNOSIS — M79605 Pain in left leg: Secondary | ICD-10-CM

## 2021-12-14 DIAGNOSIS — F419 Anxiety disorder, unspecified: Secondary | ICD-10-CM

## 2021-12-14 DIAGNOSIS — G4700 Insomnia, unspecified: Secondary | ICD-10-CM | POA: Diagnosis not present

## 2021-12-14 DIAGNOSIS — M545 Low back pain, unspecified: Secondary | ICD-10-CM | POA: Diagnosis not present

## 2021-12-14 MED ORDER — GABAPENTIN 100 MG PO CAPS: 100.0000 mg | ORAL_CAPSULE | Freq: Three times a day (TID) | ORAL | 3 refills | Status: DC

## 2021-12-14 NOTE — Progress Notes (Addendum)
? ?  Kelsey [name] is a 46 y.o. female who presents today for an office visit. ? ?Assessment/Plan:  ?New/Acute Problems: ?Left Lower Back Pain ?No red flags.  Likely musculoskeletal.  She had some relief with trigger point injection today.  See below procedure note.  We will add on gabapentin.  She can continue NSAIDs and Flexeril.  We will place referral for her to see a physical therapist.  She will let me know if this continues to be an issue and we can refer to sports medicine. ? ?Chronic Problems Addressed Today: ?Insomnia ?Doing much better with trazodone 25 mg nightly as needed.  No significant side effects. ? ?Anxiety ?Doing well on trazodone 25 mg nightly ? ? ?  ?Subjective:  ?HPI: ? ?Patient here with back pain. Started about a week ago. She saw a different provider at this office a week ago. She was given injections of depomedrol and toradol. She was also started on flexeril and oral toradol for a few days. Symptoms improved modestly for a few days. Yesterday pain has become more focal into the left lower part of her back. Hurts with movement. She is having some radiation into her left leg.  ? ?   ?  ?Objective:  ?Physical Exam: ?BP 104/72 (BP Location: Right Arm)   Pulse 76   Temp 98 ?F (36.7 ?C) (Temporal)   Ht 5\' 2"  (1.575 m)   Wt 186 lb 12.8 oz (84.7 kg)   SpO2 98%   BMI 34.17 kg/m?   ?Gen: No acute distress, resting comfortably ?CV: Regular rate and rhythm with no murmurs appreciated ?Pulm: Normal work of breathing, clear to auscultation bilaterally with no crackles, wheezes, or rhonchi ?MSK: ?- Back: No deformities.  Several tender areas of palpation on left lower lumbar area.  Straight leg raise positive. ?Neuro: Grossly normal, moves all extremities ?Psych: Normal affect and thought content ? ?Trigger Point Injection  ? ?Pre-operative diagnosis: myofascial pain ? ?Post-operative diagnosis: myofascial pain ? ?After risks and benefits were explained including bleeding, infection, worsening  of the pain, damage to the area being injected, weakness, allergic reaction to medications, vascular injection, and nerve damage, signed consent was obtained.  All questions were answered.   ? ?The area of the trigger point was identified and the skin prepped three times with alcohol and the alcohol allowed to dry.  Next, a 25 gauge 1 inch needle was placed in the area of the trigger point.  Once reproduction of the pain was elicited and negative aspiration confirmed, the trigger point was injected and the needle removed.  A total of 4 lesions were injected in 1 muscle group.  ? ?The patient did tolerate the procedure well and there were not complications.   ? ?Medication used: 1% lidocaine without epinephrine. A total of 51mL of 1% epinephrine was used.  ? ?Trigger points injected: 4   ? ?Trigger point(s) location(s):  left lumbar paraspinal muscles ? ?   ? ?11m. Katina Degree, MD ?12/14/2021 12:04 PM  ?

## 2021-12-14 NOTE — Patient Instructions (Signed)
It was very nice to see you today! ? ?I think you are probably having a spasm in your back.  Please continue the Flexeril.  Please try the gabapentin.  I will refer you to see a physical therapist. ? ?We did a trigger point injection today.  We can repeat this again next week if needed. ? ?Take care, ?Dr [name] ? ?PLEASE NOTE: ? ?If you had any lab tests please let us know if you have not heard back within a few days. You may see your results on mychart before we have a chance to review them but we will give you a call once they are reviewed by [place]. If we ordered any referrals today, please let us know if you have not heard from their office within the next week.  ? ?Please try these tips to maintain a healthy lifestyle: ? ?Eat at least 3 REAL meals and 1-2 snacks per day.  Aim for no more than 5 hours between eating.  If you eat breakfast, please do so within one hour of getting up.  ? ?Each meal should contain half fruits/vegetables, one quarter protein, and one quarter carbs (no bigger than a computer mouse) ? ?Cut down on sweet beverages. This includes juice, soda, and sweet tea.  ? ?Drink at least 1 glass of water with each meal and aim for at least 8 glasses per day ? ?Exercise at least 150 minutes every week.   ?

## 2021-12-14 NOTE — Assessment & Plan Note (Signed)
Doing well on trazodone 25 mg nightly ?

## 2021-12-14 NOTE — Assessment & Plan Note (Signed)
Doing much better with trazodone 25 mg nightly as needed.  No significant side effects. ?

## 2021-12-26 MED ORDER — LIDOCAINE HCL 2 % IJ SOLN
20.0000 mL | Freq: Once | INTRAMUSCULAR | Status: AC
  Administered 2021-12-14: 400 mg

## 2021-12-26 NOTE — Addendum Note (Signed)
Addended by: Royann Shivers on: 12/26/2021 11:31 AM ? ? Modules accepted: Orders ? ?

## 2022-01-02 NOTE — Telephone Encounter (Signed)
Please advise 

## 2022-01-03 NOTE — Telephone Encounter (Signed)
Greggory Keen is a good option if insurance will pay for it.  Please send in 2.5 mg weekly for 4 weeks.  I would like for her to follow-up with [place] in a few weeks to let us know how this is working. ?

## 2022-01-05 ENCOUNTER — Other Ambulatory Visit: Payer: Self-pay | Admitting: *Deleted

## 2022-01-05 MED ORDER — TIRZEPATIDE 2.5 MG/0.5ML ~~LOC~~ SOAJ: 2.5000 mg | SUBCUTANEOUS | 0 refills | Status: DC

## 2022-01-16 ENCOUNTER — Telehealth: Payer: Self-pay | Admitting: *Deleted

## 2022-01-16 NOTE — Telephone Encounter (Signed)
See previews note 

## 2022-01-16 NOTE — Telephone Encounter (Signed)
We reviewed the information you provided in support of a request to obtain Mounjaro 2.5 ?mg/0.5 PEN INJCTR under your patient?s plan. We are unable to approve this request for the ?following reason(s): ?? Coverage is provided for type 2 diabetes mellitus. Coverage cannot be authorized at ?this time.  ? ?Patient notified, advise to call insurance for recommendation on approval  medication  ? ? ?

## 2022-01-16 NOTE — Telephone Encounter (Signed)
Patient stated taking Rx Mounjaro  ?PA done   ?  ?PA (Key: [id number]) ?Rx #: [id number] ?Mounjaro 2.5MG /0.5ML pen-injectors ?Waiting for determination  ?

## 2022-01-16 NOTE — Telephone Encounter (Signed)
Left message to return call to our office at their convenience.  ? ?Have question for a PA   ?

## 2022-02-02 DIAGNOSIS — L821 Other seborrheic keratosis: Secondary | ICD-10-CM | POA: Diagnosis not present

## 2022-02-10 ENCOUNTER — Ambulatory Visit: Payer: BC Managed Care – PPO | Admitting: Family Medicine

## 2022-02-10 ENCOUNTER — Encounter: Payer: Self-pay | Admitting: Family Medicine

## 2022-02-10 VITALS — BP 113/80 | HR 75 | Temp 98.3°F | Ht 62.0 in | Wt 170.8 lb

## 2022-02-10 DIAGNOSIS — M545 Low back pain, unspecified: Secondary | ICD-10-CM | POA: Diagnosis not present

## 2022-02-10 DIAGNOSIS — G4700 Insomnia, unspecified: Secondary | ICD-10-CM

## 2022-02-10 DIAGNOSIS — E669 Obesity, unspecified: Secondary | ICD-10-CM | POA: Diagnosis not present

## 2022-02-10 MED ORDER — CYCLOBENZAPRINE HCL 10 MG PO TABS
10.0000 mg | ORAL_TABLET | Freq: Three times a day (TID) | ORAL | 0 refills | Status: DC | PRN
Start: 2022-02-10 — End: 2022-09-07

## 2022-02-10 MED ORDER — DICLOFENAC SODIUM 75 MG PO TBEC: 75.0000 mg | DELAYED_RELEASE_TABLET | Freq: Two times a day (BID) | ORAL | 0 refills | Status: DC

## 2022-02-10 NOTE — Assessment & Plan Note (Signed)
She is down 18 pounds over the last couple months.  She is on [facility] as prescribed by a physician in [place].

## 2022-02-10 NOTE — Assessment & Plan Note (Signed)
Doing well with trazodone  

## 2022-02-10 NOTE — Progress Notes (Signed)
Kelsey [name] is a 46 y.o. female who presents today for an office visit.  Assessment/Plan:  New/Acute Problems: Back Pain No red flags.  Likely muscular strain.  This is her second flareup in the past few months.  We will start diclofenac and Flexeril.  We repeated trigger point injections as below.  She has done well with this in the past.  She tolerated well.  See below procedure note.  We will place referral to physical therapy.  If this continues to be a recurrent issue it is not improving with above may consider referral to sports medicine.  Chronic Problems Addressed Today: Obesity She is down 18 pounds over the last couple months.  She is on [facility] as prescribed by a physician in [place].  Insomnia Doing well with trazodone     Subjective:  HPI:  Patient is here for back pain follow up.  We last saw her for this about 2 months ago.  We performed trigger point injections at that point.  This worked well for her.  Her symptoms had significantly improved for several weeks with home exercises.  Unfortunately she had a recurrence in her back pain a couple of days ago after taking a run at the gym.  Pain predominately located in right lower back.  She is interested in seeing physical therapy at this point.  Since her last visit she has also been started on [facility] by a provider outside this office.  This has been working well for her.  She is working on diet and exercise as well.       Objective:  Physical Exam: BP 113/80 (BP Location: Left Arm)   Pulse 75   Temp 98.3 F (36.8 C) (Temporal)   Ht 5\' 2"  (1.575 m)   Wt 170 lb 12.8 oz (77.5 kg)   LMP 01/16/2022 (Exact Date)   SpO2 100%   BMI 31.24 kg/m   Wt Readings from Last 3 Encounters:  02/10/22 170 lb 12.8 oz (77.5 kg)  12/14/21 186 lb 12.8 oz (84.7 kg)  11/21/21 187 lb 9.6 oz (85.1 kg)  Gen: No acute distress, resting comfortably CV: Regular rate and rhythm with no murmurs appreciated Pulm: Normal work of  breathing, clear to auscultation bilaterally with no crackles, wheezes, or rhonchi MSK: - Back: No deformities.  Tenderness to palpation along bilateral lower lumbar paraspinal muscles.  Several tender nodules noted.  Neuro: Grossly normal, moves all extremities Psych: Normal affect and thought content  Trigger Point Injection    Pre-operative diagnosis: myofascial pain   Post-operative diagnosis: myofascial pain   After risks and benefits were explained including bleeding, infection, worsening of the pain, damage to the area being injected, weakness, allergic reaction to medications, vascular injection, and nerve damage, signed consent was obtained.  All questions were answered.     The area of the trigger point was identified and the skin prepped three times with alcohol and the alcohol allowed to dry.  Next, a 25 gauge 1 inch needle was placed in the area of the trigger point.  Once reproduction of the pain was elicited and negative aspiration confirmed, the trigger point was injected and the needle removed.  A total of 4 lesions were injected in 1 muscle group.    The patient did tolerate the procedure well and there were not complications.     Medication used: 1% lidocaine without epinephrine. A total of 65mL of 1% epinephrine was used.    Trigger points injected: 4  Trigger point(s) location(s):  Right lumbar paraspinal muscles       [name] M. [name], MD 02/10/2022 2:38 PM

## 2022-02-10 NOTE — Patient Instructions (Signed)
It was very nice to see you today!  We did a trigger point injection today.  Please start the diclofenac and Flexeril.  We will refer you to see a physical therapist.  Let us know if not improving.  Take care, Dr Jerline Pain  PLEASE NOTE:  If you had any lab tests please let us know if you have not heard back within a few days. You may see your results on mychart before we have a chance to review them but we will give you a call once they are reviewed by [place]. If we ordered any referrals today, please let us know if you have not heard from their office within the next week.   Please try these tips to maintain a healthy lifestyle:  Eat at least 3 REAL meals and 1-2 snacks per day.  Aim for no more than 5 hours between eating.  If you eat breakfast, please do so within one hour of getting up.   Each meal should contain half fruits/vegetables, one quarter protein, and one quarter carbs (no bigger than a computer mouse)  Cut down on sweet beverages. This includes juice, soda, and sweet tea.   Drink at least 1 glass of water with each meal and aim for at least 8 glasses per day  Exercise at least 150 minutes every week.

## 2022-02-20 ENCOUNTER — Ambulatory Visit (INDEPENDENT_AMBULATORY_CARE_PROVIDER_SITE_OTHER): Payer: BC Managed Care – PPO | Admitting: Physical Therapy

## 2022-02-20 ENCOUNTER — Encounter: Payer: Self-pay | Admitting: Physical Therapy

## 2022-02-20 DIAGNOSIS — M6281 Muscle weakness (generalized): Secondary | ICD-10-CM | POA: Diagnosis not present

## 2022-02-20 DIAGNOSIS — M545 Low back pain, unspecified: Secondary | ICD-10-CM | POA: Diagnosis not present

## 2022-02-20 NOTE — Therapy (Signed)
OUTPATIENT PHYSICAL THERAPY THORACOLUMBAR EVALUATION   Patient Name: Kelsey [name] MRN: [id number] DOB:02/24/1976, 46 y.o., female Today's Date: 02/20/2022   PT End of Session - 02/20/22 0935     Visit Number 1    Number of Visits 12    Authorization Type BCBS    PT Start Time 0936    PT Stop Time 1013    PT Time Calculation (min) 37 min             Past Medical History:  Diagnosis Date   Hypothyroidism    Past Surgical History:  Procedure Laterality Date   APPENDECTOMY     DILATION AND CURETTAGE OF UTERUS     LAPAROSCOPIC OVARIAN CYSTECTOMY     Patient Active Problem List   Diagnosis Date Noted   Obesity 02/10/2022   Anxiety 07/28/2021   Neck pain 07/28/2021   Nonintractable headache 07/28/2021   Insomnia 07/28/2021    PCP: Jacquiline Doealeb Parker  REFERRING PROVIDER: Ardith DarkParker, Caleb M, MD  REFERRING DIAG: M54.50 (ICD-10-CM) - Low back pain, unspecified back pain laterality, unspecified chronicity, unspecified whether sciatica present  Rationale for Evaluation and Treatment Rehabilitation  THERAPY DIAG:  Low back pain, unspecified back pain laterality, unspecified chronicity, unspecified whether sciatica present  Muscle weakness (generalized)  ONSET DATE: 3 months ago  SUBJECTIVE:                                                                                                                                                                                           SUBJECTIVE STATEMENT: States that she had back pain about 3 months ago and had a shot in her back as she could barely walk. States that she used to run and now she can't. States she has been trying to lose weight. States that she has been lifting and thinks her form was off. 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 States that about 2 weeks ago. States that she tried to run and she felt fine and the next day she couldn't walk and her pain was in her right low back and into her buttocks. States that she feels  it reside in her right buttocks. States that her back pain never completely resolved but felt a little better. States mornings are the worse and now she has difficulties  bending down and when she tries to get up it feels like it pinches and it takes her breath away. States has tried cupping, massage. Does a LTR stretch, piriformis stretch.   PERTINENT HISTORY:  No significant history  PAIN:  Are you having pain? Yes: NPRS scale: 4/10 Pain location: right low back and  buttocks Pain description: dull, sharp Aggravating factors: forward bending, running, AM  Relieving factors: rest, injections   PRECAUTIONS: None  WEIGHT BEARING RESTRICTIONS No  FALLS:  Has patient fallen in last 6 months? 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  PLOF: Independent  PATIENT GOALS to have less pain and to be able to continue to work on weight loss goals   OBJECTIVE:     SCREENING FOR RED FLAGS: Bowel or bladder incontinence: No Spinal tumors: No Cauda equina syndrome: No Compression fracture: No Abdominal aneurysm: No  COGNITION:  Overall cognitive status: Within functional limits for tasks assessed     SENSATION: WFL  MUSCLE LENGTH: Hamstrings: Right 0 deg; Left 0 deg   POSTURE: rounded shoulders and forward head  PALPATION: Tenderness to palpation along right glutes, WL, pain with spring testing in lumbar spine (hyper mobility noted)  LUMBAR ROM:   Active  A/PROM  eval  Flexion 50% limited- pain coming up  Extension 75% limited tight  Right lateral flexion 50% limited  Left lateral flexion 50% limited  Right rotation   Left rotation    (Blank rows = not tested)    LE Measurements Lower Extremity Right 02/20/2022 Left 02/20/2022   A/PROM MMT A/PROM MMT  Hip Flexion WFL 4*  4  Hip Extension  4-*  4-*  Hip Abduction      Hip Adduction      Hip Internal rotation [facility]   Hip External rotation [facility]  WFL   Knee Flexion  3+  3+  Knee Extension  4  4+   Ankle Dorsiflexion      Ankle Plantarflexion      Ankle Inversion      Ankle Eversion       (Blank rows = not tested)  * pain   LUMBAR SPECIAL TESTS:  Straight leg raise test: Negative, Slump test: Negative, SI Compression/distraction test: Positive, FABER test: Negative, and ely's Neg  FUNCTIONAL TESTS:  Bed mobilities - painful      TODAY'S TREATMENT  02/20/2022  Therapeutic Exercise:  Aerobic: Supine: TRA activation 5 minutes Prone: wind shield wipers - pain  Seated:  Standing: Neuromuscular Re-education: Manual Therapy: Therapeutic Activity: Self Care: Trigger Point Dry Needling:  Modalities:     PATIENT EDUCATION:  Education details: on current presentation, on HEP, on clinical outcomes score and POC, on benefits and use of SIJ belt, on anatomy Person educated: Patient Education method: Explanation, Demonstration, and Handouts Education comprehension: verbalized understanding    HOME EXERCISE PROGRAM: No medbridge at this time just TRA activation   ASSESSMENT:  CLINICAL IMPRESSION: Patient is a 46 y.o. female who was seen today for physical therapy evaluation and treatment for low back pain. Patient presents with instability and pain along her SIJ and demonstrated reduced pain with compression at her pelvis. Educated patient in current condition, presentation and plan moving forward. Patient would greatly benefit from skilled PT to improve overall function and QOL. .    OBJECTIVE IMPAIRMENTS decreased activity tolerance, decreased endurance, decreased mobility, difficulty walking, decreased ROM, decreased strength, hypomobility, postural dysfunction, and pain.   ACTIVITY LIMITATIONS carrying, lifting, bending, standing, bed mobility, and dressing  PARTICIPATION LIMITATIONS: cleaning and occupation  PERSONAL FACTORS Age are also affecting patient's functional outcome.   REHAB POTENTIAL: Good  CLINICAL DECISION MAKING:  Stable/uncomplicated  EVALUATION COMPLEXITY: Moderate   GOALS: Goals reviewed with patient?  yes  SHORT TERM GOALS:  Patient will be independent in self  management strategies to improve quality of life and functional outcomes. Baseline: new program Target date: 03/13/2022 Goal status: INITIAL  2.  Patient will report at least 50% improvement in overall symptoms and/or function to demonstrate improved functional mobility Baseline: 0% Target date: 03/13/2022 Goal status: INITIAL  3.  Patient will be able to perform all bed mobilities without pain Baseline: new program Target date: 03/13/2022 Goal status: INITIAL     LONG TERM GOALS:  Patient will report at least 75% improvement in overall symptoms and/or function to demonstrate improved functional mobility Baseline: 0% Target date: 04/03/2022 Goal status: INITIAL  2.  Patient will be able to demonstrate painfree lumbar motions Baseline: painful Target date: 04/03/2022 Goal status: INITIAL  3.  Patient will be able to participate in all recreational activities without difficulties to return to optimal function Baseline: unable Target date: 04/03/2022 Goal status: INITIAL  PLAN: PT FREQUENCY: 2x/week  PT DURATION: 6 weeks  PLANNED INTERVENTIONS: Therapeutic exercises, Therapeutic activity, Neuromuscular re-education, Balance training, Gait training, Patient/Family education, Joint manipulation, Joint mobilization, Aquatic Therapy, Dry Needling, Electrical stimulation, Cryotherapy, Moist heat, Ionotophoresis 4mg /ml Dexamethasone, and Manual therapy.  PLAN FOR NEXT SESSION: core strength, hip/core stability - iso/bridges   11:43 AM, 02/20/22 04/22/22, DPT Physical Therapy with [facility]

## 2022-02-22 DIAGNOSIS — R87612 Low grade squamous intraepithelial lesion on cytologic smear of cervix (LGSIL): Secondary | ICD-10-CM | POA: Diagnosis not present

## 2022-02-22 DIAGNOSIS — R87611 Atypical squamous cells cannot exclude high grade squamous intraepithelial lesion on cytologic smear of cervix (ASC-H): Secondary | ICD-10-CM | POA: Diagnosis not present

## 2022-02-23 ENCOUNTER — Ambulatory Visit: Payer: BC Managed Care – PPO | Admitting: Physical Therapy

## 2022-02-23 ENCOUNTER — Encounter: Payer: Self-pay | Admitting: Physical Therapy

## 2022-02-23 DIAGNOSIS — M6281 Muscle weakness (generalized): Secondary | ICD-10-CM

## 2022-02-23 DIAGNOSIS — M5459 Other low back pain: Secondary | ICD-10-CM

## 2022-02-23 NOTE — Therapy (Signed)
OUTPATIENT PHYSICAL THERAPY TREATMENT NOTE   Patient Name: Kelsey [name] MRN: [id number] DOB:1975-12-02, 46 y.o., female Today's Date: 02/23/2022   END OF SESSION:   PT End of Session - 02/23/22 1222     Visit Number 2    Number of Visits 12    Authorization Type BCBS    PT Start Time 1222    PT Stop Time 1300    PT Time Calculation (min) 38 min             Past Medical History:  Diagnosis Date   Hypothyroidism    Past Surgical History:  Procedure Laterality Date   APPENDECTOMY     DILATION AND CURETTAGE OF UTERUS     LAPAROSCOPIC OVARIAN CYSTECTOMY     Patient Active Problem List   Diagnosis Date Noted   Obesity 02/10/2022   Anxiety 07/28/2021   Neck pain 07/28/2021   Nonintractable headache 07/28/2021   Insomnia 07/28/2021    PCP: Jacquiline Doealeb Parker   REFERRING PROVIDER: Ardith DarkParker, Caleb M, MD   REFERRING DIAG: M54.50 (ICD-10-CM) - Low back pain, unspecified back pain laterality, unspecified chronicity, unspecified whether sciatica present   Rationale for Evaluation and Treatment Rehabilitation   THERAPY DIAG:  Low back pain, unspecified back pain laterality, unspecified chronicity, unspecified whether sciatica present   Muscle weakness (generalized)   ONSET DATE: 3 months ago   SUBJECTIVE:                                                                                                                                                                                            SUBJECTIVE STATEMENT: 02/23/2022 Reports she ordered the belt and feels better today with just trying to keep her core activated  Eval: States that she had back pain about 3 months ago and had a shot in her back as she could barely walk. States that she used to run and now she can't. States she has been trying to lose weight. States that she has been lifting and thinks her form was off. 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 States that about 2 weeks ago. States that she tried to run and  she felt fine and the next day she couldn't walk and her pain was in her right low back and into her buttocks. States that she feels it reside in her right buttocks. States that her back pain never completely resolved but felt a little better. States mornings are the worse and now she has difficulties  bending down and when she tries to get up it feels like it pinches and it takes her breath away. States has tried  cupping, massage. Does a LTR stretch, piriformis stretch.    PERTINENT HISTORY:  No significant history   PAIN:  Are you having pain? Yes: NPRS scale: 5/10 Pain location: right low back and buttocks Pain description: dull, sharp Aggravating factors: forward bending, running, AM  Relieving factors: rest, injections     PRECAUTIONS: None   WEIGHT BEARING RESTRICTIONS No   FALLS:  Has patient fallen in last 6 months? 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   PLOF: Independent   PATIENT GOALS to have less pain and to be able to continue to work on weight loss goals     OBJECTIVE:        SCREENING FOR RED FLAGS: Bowel or bladder incontinence: No Spinal tumors: No Cauda equina syndrome: No Compression fracture: No Abdominal aneurysm: No   COGNITION:           Overall cognitive status: Within functional limits for tasks assessed                          SENSATION: WFL   MUSCLE LENGTH: Hamstrings: Right 0 deg; Left 0 deg     POSTURE: rounded shoulders and forward head   PALPATION: Tenderness to palpation along right glutes, WL, pain with spring testing in lumbar spine (hyper mobility noted)   LUMBAR ROM:    Active  A/PROM  eval  Flexion 50% limited- pain coming up  Extension 75% limited tight  Right lateral flexion 50% limited  Left lateral flexion 50% limited  Right rotation    Left rotation     (Blank rows = not tested)                LE Measurements       Lower Extremity Right 02/20/2022 Left 02/20/2022    A/PROM MMT A/PROM MMT   Hip Flexion WFL 4*   4  Hip Extension   4-*   4-*  Hip Abduction          Hip Adduction          Hip Internal rotation [facility]    Hip External rotation [facility]   WFL    Knee Flexion   3+   3+  Knee Extension   4   4+  Ankle Dorsiflexion          Ankle Plantarflexion          Ankle Inversion          Ankle Eversion           (Blank rows = not tested)            * pain     LUMBAR SPECIAL TESTS:  Straight leg raise test: Negative, Slump test: Negative, SI Compression/distraction test: Positive, FABER test: Negative, and ely's Neg   FUNCTIONAL TESTS:  Bed mobilities - painful         TODAY'S TREATMENT  02/23/2022 Therapeutic Exercise:    Aerobic: Supine: TRA activation 5 minutes, hip add/abd iso 2 minutes alternating, bridge with hip abd band 3x10 5" holds     Seated:    Standing: Neuromuscular Re-education: S/l : clamshells with tactile support x20 B for er activation, belly breathing 6 minutes, long exhale, bed mobilities with hip engagement Manual Therapy: Therapeutic Activity: Self Care: Trigger Point Dry Needling:  Modalities:        PATIENT EDUCATION:  Education details: on HEP, on how to  move, breath and activate muscles with movement Person educated: Patient Education method: Explanation, Demonstration, and Handouts Education comprehension: verbalized understanding       HOME EXERCISE PROGRAM: [id number]   ASSESSMENT:   CLINICAL IMPRESSION: 02/23/2022 Session focused on movement retraining and muscles activation. Able to perform transitional movements and bed mobilities without pain with hip activation. Fatigue in core and hips noted. Tactile and verbal cues throughout. Will continue with current POC as tolerated.   Eval: Patient is a 46 y.o. female who was seen today for physical therapy evaluation and treatment for low back pain. Patient presents with instability and pain along her SIJ and demonstrated reduced pain with compression at her pelvis. Educated  patient in current condition, presentation and plan moving forward. Patient would greatly benefit from skilled PT to improve overall function and QOL. .      OBJECTIVE IMPAIRMENTS decreased activity tolerance, decreased endurance, decreased mobility, difficulty walking, decreased ROM, decreased strength, hypomobility, postural dysfunction, and pain.    ACTIVITY LIMITATIONS carrying, lifting, bending, standing, bed mobility, and dressing   PARTICIPATION LIMITATIONS: cleaning and occupation   PERSONAL FACTORS Age are also affecting patient's functional outcome.    REHAB POTENTIAL: Good   CLINICAL DECISION MAKING: Stable/uncomplicated   EVALUATION COMPLEXITY: Moderate     GOALS: Goals reviewed with patient?  yes   SHORT TERM GOALS:   Patient will be independent in self management strategies to improve quality of life and functional outcomes. Baseline: new program Target date: 03/13/2022 Goal status: INITIAL   2.  Patient will report at least 50% improvement in overall symptoms and/or function to demonstrate improved functional mobility Baseline: 0% Target date: 03/13/2022 Goal status: INITIAL   3.  Patient will be able to perform all bed mobilities without pain Baseline: new program Target date: 03/13/2022 Goal status: INITIAL         LONG TERM GOALS:   Patient will report at least 75% improvement in overall symptoms and/or function to demonstrate improved functional mobility Baseline: 0% Target date: 04/03/2022 Goal status: INITIAL   2.  Patient will be able to demonstrate painfree lumbar motions Baseline: painful Target date: 04/03/2022 Goal status: INITIAL   3.  Patient will be able to participate in all recreational activities without difficulties to return to optimal function Baseline: unable Target date: 04/03/2022 Goal status: INITIAL   PLAN: PT FREQUENCY: 2x/week   PT DURATION: 6 weeks   PLANNED INTERVENTIONS: Therapeutic exercises, Therapeutic activity,  Neuromuscular re-education, Balance training, Gait training, Patient/Family education, Joint manipulation, Joint mobilization, Aquatic Therapy, Dry Needling, Electrical stimulation, Cryotherapy, Moist heat, Ionotophoresis 4mg /ml Dexamethasone, and Manual therapy.   PLAN FOR NEXT SESSION: core strength, hip/core stability - iso/bridges    1:05 PM, 02/23/22 04/25/22, DPT Physical Therapy with [facility]

## 2022-02-27 ENCOUNTER — Encounter: Payer: BC Managed Care – PPO | Admitting: Physical Therapy

## 2022-03-02 ENCOUNTER — Encounter: Payer: Self-pay | Admitting: Physical Therapy

## 2022-03-02 ENCOUNTER — Ambulatory Visit: Payer: BC Managed Care – PPO | Admitting: Physical Therapy

## 2022-03-02 DIAGNOSIS — M6281 Muscle weakness (generalized): Secondary | ICD-10-CM

## 2022-03-02 DIAGNOSIS — M5459 Other low back pain: Secondary | ICD-10-CM

## 2022-03-02 NOTE — Therapy (Signed)
OUTPATIENT PHYSICAL THERAPY TREATMENT NOTE   Patient Name: Kelsey [name] MRN: [id number] DOB:[birth year], 46 y.o., female Today's Date: 03/02/2022   END OF SESSION:   PT End of Session - 03/02/22 1015     Visit Number 3    Number of Visits 12    Authorization Type BCBS    PT Start Time 1017    PT Stop Time 1057    PT Time Calculation (min) 40 min             Past Medical History:  Diagnosis Date   Hypothyroidism    Past Surgical History:  Procedure Laterality Date   APPENDECTOMY     DILATION AND CURETTAGE OF UTERUS     LAPAROSCOPIC OVARIAN CYSTECTOMY     Patient Active Problem List   Diagnosis Date Noted   Obesity 02/10/2022   Anxiety 07/28/2021   Neck pain 07/28/2021   Nonintractable headache 07/28/2021   Insomnia 07/28/2021    PCP: Jacquiline Doe   REFERRING PROVIDER: Ardith Dark, MD   REFERRING DIAG: M54.50 (ICD-10-CM) - Low back pain, unspecified back pain laterality, unspecified chronicity, unspecified whether sciatica present   Rationale for Evaluation and Treatment Rehabilitation   THERAPY DIAG:  Low back pain, unspecified back pain laterality, unspecified chronicity, unspecified whether sciatica present   Muscle weakness (generalized)   ONSET DATE: 3 months ago   SUBJECTIVE:                                                                                                                                                                                            SUBJECTIVE STATEMENT: 03/02/2022 States that she was so stressed and had increased pain over the weekend. States that when she engages her core and muscles it feels better. States that she is worried she will be in a lot of pain with her upcoming trip to [place].  Eval: States that she had back pain about 3 months ago and had a shot in her back as she could barely walk. States that she used to run and now she can't. States she has been trying to lose weight. States that she has been  lifting and thinks her form was off. 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 States that about 2 weeks ago. States that she tried to run and she felt fine and the next day she couldn't walk and her pain was in her right low back and into her buttocks. States that she feels it reside in her right buttocks. States that her back pain never completely resolved but felt a little better. States mornings are the worse and now  she has difficulties  bending down and when she tries to get up it feels like it pinches and it takes her breath away. States has tried cupping, massage. Does a LTR stretch, piriformis stretch.    PERTINENT HISTORY:  No significant history   PAIN:  Are you having pain? Yes: NPRS scale: 5/10 Pain location: right low back and buttocks Pain description: dull, sharp Aggravating factors: forward bending, running, AM  Relieving factors: rest, injections     PRECAUTIONS: None   WEIGHT BEARING RESTRICTIONS No   FALLS:  Has patient fallen in last 6 months? 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   PLOF: Independent   PATIENT GOALS to have less pain and to be able to continue to work on weight loss goals     OBJECTIVE:        SCREENING FOR RED FLAGS: Bowel or bladder incontinence: No Spinal tumors: No Cauda equina syndrome: No Compression fracture: No Abdominal aneurysm: No   COGNITION:           Overall cognitive status: Within functional limits for tasks assessed                          SENSATION: WFL   MUSCLE LENGTH: Hamstrings: Right 0 deg; Left 0 deg     POSTURE: rounded shoulders and forward head   PALPATION: Tenderness to palpation along right glutes, WL, pain with spring testing in lumbar spine (hyper mobility noted)   LUMBAR ROM:    Active  A/PROM  eval  Flexion 50% limited- pain coming up  Extension 75% limited tight  Right lateral flexion 50% limited  Left lateral flexion 50% limited  Right rotation    Left rotation      (Blank rows = not tested)                LE Measurements       Lower Extremity Right 02/20/2022 Left 02/20/2022    A/PROM MMT A/PROM MMT  Hip Flexion WFL 4*   4  Hip Extension   4-*   4-*  Hip Abduction          Hip Adduction          Hip Internal rotation [facility]   [facility]    Hip External rotation [facility]   WFL    Knee Flexion   3+   3+  Knee Extension   4   4+  Ankle Dorsiflexion          Ankle Plantarflexion          Ankle Inversion          Ankle Eversion           (Blank rows = not tested)            * pain     LUMBAR SPECIAL TESTS:  Straight leg raise test: Negative, Slump test: Negative, SI Compression/distraction test: Positive, FABER test: Negative, and ely's Neg   FUNCTIONAL TESTS:  Bed mobilities - painful         TODAY'S TREATMENT  03/02/2022 Therapeutic Exercise:    Aerobic:    Seated:    Standing: Neuromuscular Re-education: Quad: TRA activation, belly breathing and pelvic tilts - tactile and verbal cues 12 minutes Manual Therapy: cupping and STM to lumbar paraspinals and glutes - tolerated well  Therapeutic Activity: Self Care: Trigger Point Dry Needling:  Modalities:    Trigger Point  Dry-Needling  Treatment instructions: Expect mild to moderate muscle soreness. S/S of pneumothorax if dry needled over a lung field, and to seek immediate medical attention should they occur. Patient verbalized understanding of these instructions and education.  Patient Consent Given: Yes Education handout provided: Yes Muscles treated: glute meds B Electrical stimulation performed: No Parameters: N/A Treatment response/outcome: reduced tissue tension    PATIENT EDUCATION:  Education details: on HEP, on DN, on risks and benefits Person educated: Patient Education method: Consulting civil engineer, Demonstration, and Handouts Education comprehension: verbalized understanding       HOME EXERCISE PROGRAM: [id number]   ASSESSMENT:   CLINICAL IMPRESSION: 03/02/2022 Session focused on  reducing pain. Relief noted with manual work but educated patient on importance of muscle activation following muscle relaxation to help promote stability in pelvis. Added quadruped core activation and pelvic tilts to HEP. This was difficult but no pain noted. No pain noted end of session. Will continue with current POC as tolerated.   Eval: Patient is a 46 y.o. female who was seen today for physical therapy evaluation and treatment for low back pain. Patient presents with instability and pain along her SIJ and demonstrated reduced pain with compression at her pelvis. Educated patient in current condition, presentation and plan moving forward. Patient would greatly benefit from skilled PT to improve overall function and QOL. .      OBJECTIVE IMPAIRMENTS decreased activity tolerance, decreased endurance, decreased mobility, difficulty walking, decreased ROM, decreased strength, hypomobility, postural dysfunction, and pain.    ACTIVITY LIMITATIONS carrying, lifting, bending, standing, bed mobility, and dressing   PARTICIPATION LIMITATIONS: cleaning and occupation   PERSONAL FACTORS Age are also affecting patient's functional outcome.    REHAB POTENTIAL: Good   CLINICAL DECISION MAKING: Stable/uncomplicated   EVALUATION COMPLEXITY: Moderate     GOALS: Goals reviewed with patient?  yes   SHORT TERM GOALS:   Patient will be independent in self management strategies to improve quality of life and functional outcomes. Baseline: new program Target date: 03/13/2022 Goal status: INITIAL   2.  Patient will report at least 50% improvement in overall symptoms and/or function to demonstrate improved functional mobility Baseline: 0% Target date: 03/13/2022 Goal status: INITIAL   3.  Patient will be able to perform all bed mobilities without pain Baseline: new program Target date: 03/13/2022 Goal status: INITIAL         LONG TERM GOALS:   Patient will report at least 75% improvement in  overall symptoms and/or function to demonstrate improved functional mobility Baseline: 0% Target date: 04/03/2022 Goal status: INITIAL   2.  Patient will be able to demonstrate painfree lumbar motions Baseline: painful Target date: 04/03/2022 Goal status: INITIAL   3.  Patient will be able to participate in all recreational activities without difficulties to return to optimal function Baseline: unable Target date: 04/03/2022 Goal status: INITIAL   PLAN: PT FREQUENCY: 2x/week   PT DURATION: 6 weeks   PLANNED INTERVENTIONS: Therapeutic exercises, Therapeutic activity, Neuromuscular re-education, Balance training, Gait training, Patient/Family education, Joint manipulation, Joint mobilization, Aquatic Therapy, Dry Needling, Electrical stimulation, Cryotherapy, Moist heat, Ionotophoresis 4mg /ml Dexamethasone, and Manual therapy.   PLAN FOR NEXT SESSION: core strength, hip/core stability - iso/bridges    12:59 PM, 03/02/22 Jerene Pitch, DPT Physical Therapy with [facility]

## 2022-03-07 DIAGNOSIS — S335XXA Sprain of ligaments of lumbar spine, initial encounter: Secondary | ICD-10-CM | POA: Diagnosis not present

## 2022-03-07 DIAGNOSIS — S139XXA Sprain of joints and ligaments of unspecified parts of neck, initial encounter: Secondary | ICD-10-CM | POA: Diagnosis not present

## 2022-03-08 ENCOUNTER — Encounter: Payer: BC Managed Care – PPO | Admitting: Physical Therapy

## 2022-03-08 ENCOUNTER — Ambulatory Visit (INDEPENDENT_AMBULATORY_CARE_PROVIDER_SITE_OTHER): Payer: BC Managed Care – PPO | Admitting: Physical Therapy

## 2022-03-08 ENCOUNTER — Encounter: Payer: Self-pay | Admitting: Physical Therapy

## 2022-03-08 DIAGNOSIS — M5459 Other low back pain: Secondary | ICD-10-CM

## 2022-03-08 DIAGNOSIS — M6281 Muscle weakness (generalized): Secondary | ICD-10-CM | POA: Diagnosis not present
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
## 2022-03-20 ENCOUNTER — Ambulatory Visit: Payer: BC Managed Care – PPO | Admitting: Family

## 2022-03-20 ENCOUNTER — Ambulatory Visit: Payer: BC Managed Care – PPO | Admitting: Physician Assistant

## 2022-03-20 ENCOUNTER — Other Ambulatory Visit: Payer: Self-pay

## 2022-03-20 VITALS — BP 110/72 | HR 69 | Temp 98.4°F | Resp 17 | Ht 62.0 in | Wt 167.6 lb

## 2022-03-20 DIAGNOSIS — M545 Low back pain, unspecified: Secondary | ICD-10-CM | POA: Diagnosis not present

## 2022-03-20 MED ORDER — KETOROLAC TROMETHAMINE 60 MG/2ML IM SOLN
60.0000 mg | Freq: Once | INTRAMUSCULAR | Status: AC
  Administered 2022-03-20: 60 mg via INTRAMUSCULAR

## 2022-03-20 MED ORDER — METHYLPREDNISOLONE ACETATE 40 MG/ML IJ SUSP
40.0000 mg | Freq: Once | INTRAMUSCULAR | Status: AC
  Administered 2022-03-20: 40 mg via INTRAMUSCULAR

## 2022-03-20 NOTE — Patient Instructions (Signed)
It was great to see you!  We did a steroid and toradol injection today Please continue to plan to see Elon Jester on Wednesday for further evaluation of your back  If any new symptoms, please follow-up with [place].  Take care,  Jarold Motto PA-C
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
## 2022-03-22 ENCOUNTER — Encounter: Payer: Self-pay | Admitting: Physical Therapy

## 2022-03-22 ENCOUNTER — Ambulatory Visit (INDEPENDENT_AMBULATORY_CARE_PROVIDER_SITE_OTHER): Payer: BC Managed Care – PPO | Admitting: Physical Therapy

## 2022-03-22 DIAGNOSIS — M5459 Other low back pain: Secondary | ICD-10-CM

## 2022-03-22 DIAGNOSIS — M6281 Muscle weakness (generalized): Secondary | ICD-10-CM | POA: Diagnosis not present

## 2022-03-22 NOTE — Therapy (Signed)
OUTPATIENT PHYSICAL THERAPY TREATMENT NOTE   Patient Name: Kelsey [name] MRN: [id number] DOB:Feb 08, 1976, 46 y.o., female Today's Date: 03/22/2022   END OF SESSION:   PT End of Session - 03/22/22 1401     Visit Number 5    Number of Visits 12    Authorization Type BCBS    PT Start Time 1400   late to apt   PT Stop Time 1430    PT Time Calculation (min) 30 min             Past Medical History:  Diagnosis Date   Hypothyroidism    Past Surgical History:  Procedure Laterality Date   APPENDECTOMY     DILATION AND CURETTAGE OF UTERUS     LAPAROSCOPIC OVARIAN CYSTECTOMY     Patient Active Problem List   Diagnosis Date Noted   Obesity 02/10/2022   Anxiety 07/28/2021   Neck pain 07/28/2021   Nonintractable headache 07/28/2021   Insomnia 07/28/2021    PCP: Jacquiline Doe   REFERRING PROVIDER: Ardith Dark, MD   REFERRING DIAG: M54.50 (ICD-10-CM) - Low back pain, unspecified back pain laterality, unspecified chronicity, unspecified whether sciatica present   Rationale for Evaluation and Treatment Rehabilitation   THERAPY DIAG:  Low back pain, unspecified back pain laterality, unspecified chronicity, unspecified whether sciatica present   Muscle weakness (generalized)   ONSET DATE: 3 months ago   SUBJECTIVE:                                                                                                                                                                                            SUBJECTIVE STATEMENT: 03/22/2022 States that she has been having pain since her trip. States her right low back/butt has been painful. States her belt helped and her exercises helped but pain wouldn't go away.States she feels locked up. States that she got an injection on Monday which helped and now she can walk.  Eval: States that she had back pain about 3 months ago and had a shot in her back as she could barely walk. States that she used to run and now she can't. States  she has been trying to lose weight. States that she has been lifting and thinks her form was off. 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 States that about 2 weeks ago. States that she tried to run and she felt fine and the next day she couldn't walk and her pain was in her right low back and into her buttocks. States that she feels it reside in her right buttocks. States that her back pain never completely resolved but felt  a little better. States mornings are the worse and now she has difficulties  bending down and when she tries to get up it feels like it pinches and it takes her breath away. States has tried cupping, massage. Does a LTR stretch, piriformis stretch.    PERTINENT HISTORY:  No significant history   PAIN:  Are you having pain? Yes: NPRS scale: 6/10 Pain location: right low back and buttocks Pain description: dull, sharp Aggravating factors: forward bending, running, AM  Relieving factors: rest, injections     PRECAUTIONS: None   WEIGHT BEARING RESTRICTIONS No   FALLS:  Has patient fallen in last 6 months? 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   PLOF: Independent   PATIENT GOALS to have less pain and to be able to continue to work on weight loss goals     OBJECTIVE:        SCREENING FOR RED FLAGS: Bowel or bladder incontinence: No Spinal tumors: No Cauda equina syndrome: No Compression fracture: No Abdominal aneurysm: No   COGNITION:           Overall cognitive status: Within functional limits for tasks assessed                          SENSATION: WFL   MUSCLE LENGTH: Hamstrings: Right 0 deg; Left 0 deg     POSTURE: rounded shoulders and forward head   PALPATION: Tenderness to palpation along right glutes, WL, pain with spring testing in lumbar spine (hyper mobility noted)   LUMBAR ROM:    Active  A/PROM  eval  Flexion 50% limited- pain coming up  Extension 75% limited tight  Right lateral flexion 50% limited  Left lateral  flexion 50% limited  Right rotation    Left rotation     (Blank rows = not tested)                LE Measurements       Lower Extremity Right 02/20/2022 Left 02/20/2022    A/PROM MMT A/PROM MMT  Hip Flexion WFL 4*   4  Hip Extension   4-*   4-*  Hip Abduction          Hip Adduction          Hip Internal rotation [facility]   [facility]    Hip External rotation [facility]   WFL    Knee Flexion   3+   3+  Knee Extension   4   4+  Ankle Dorsiflexion          Ankle Plantarflexion          Ankle Inversion          Ankle Eversion           (Blank rows = not tested)            * pain     LUMBAR SPECIAL TESTS:  Straight leg raise test: Negative, Slump test: Negative, SI Compression/distraction test: Positive, FABER test: Negative, and ely's Neg   FUNCTIONAL TESTS:  Bed mobilities - painful         TODAY'S TREATMENT  03/22/2022 Therapeutic Exercise:    Aerobic:    Seated: hip add/abd iso x15 5" holds    prone belly breathing 6 minutes  Supine: s/l spinal twist 5 minutes Neuromuscular Re-education:  Manual Therapy: STM BR glutes in prone Therapeutic Activity: Self Care: Trigger Point Dry Needling:  Modalities: thermo-therapy to lumbopelvic region during breathing exercises   Trigger Point Dry-Needling  Treatment instructions: Expect mild to moderate muscle soreness. S/S of pneumothorax if dry needled over a lung field, and to seek immediate medical attention should they occur. Patient verbalized understanding of these instructions and education.  Patient Consent Given: Yes Education handout provided: Previously provided Muscles treated: right glute meds,  Electrical stimulation performed: No Parameters: N/A Treatment response/outcome: reduced tension    PATIENT EDUCATION:  Education details: on HEP Person educated: Patient Education method: Programmer, multimedia, Facilities manager, and Handouts Education comprehension: verbalized understanding       HOME EXERCISE PROGRAM: [id number]    ASSESSMENT:   CLINICAL IMPRESSION: 03/22/2022 Patient tolerated needling well with reduced tension in glutes noted afterwards. Continued with stabilization after needling and then added spinal twist with SIJ favored to help mobilize SIJ. Tolerated this well with self cavitation. Reduced pain and just muscle soreness noted end of session.  Eval: Patient is a 46 y.o. female who was seen today for physical therapy evaluation and treatment for low back pain. Patient presents with instability and pain along her SIJ and demonstrated reduced pain with compression at her pelvis. Educated patient in current condition, presentation and plan moving forward. Patient would greatly benefit from skilled PT to improve overall function and QOL. .      OBJECTIVE IMPAIRMENTS decreased activity tolerance, decreased endurance, decreased mobility, difficulty walking, decreased ROM, decreased strength, hypomobility, postural dysfunction, and pain.    ACTIVITY LIMITATIONS carrying, lifting, bending, standing, bed mobility, and dressing   PARTICIPATION LIMITATIONS: cleaning and occupation   PERSONAL FACTORS Age are also affecting patient's functional outcome.    REHAB POTENTIAL: Good   CLINICAL DECISION MAKING: Stable/uncomplicated   EVALUATION COMPLEXITY: Moderate     GOALS: Goals reviewed with patient?  yes   SHORT TERM GOALS:   Patient will be independent in self management strategies to improve quality of life and functional outcomes. Baseline: new program Target date: 03/13/2022 Goal status: INITIAL   2.  Patient will report at least 50% improvement in overall symptoms and/or function to demonstrate improved functional mobility Baseline: 0% Target date: 03/13/2022 Goal status: INITIAL   3.  Patient will be able to perform all bed mobilities without pain Baseline: new program Target date: 03/13/2022 Goal status: INITIAL         LONG TERM GOALS:   Patient will report at least 75% improvement  in overall symptoms and/or function to demonstrate improved functional mobility Baseline: 0% Target date: 04/03/2022 Goal status: INITIAL   2.  Patient will be able to demonstrate painfree lumbar motions Baseline: painful Target date: 04/03/2022 Goal status: INITIAL   3.  Patient will be able to participate in all recreational activities without difficulties to return to optimal function Baseline: unable Target date: 04/03/2022 Goal status: INITIAL   PLAN: PT FREQUENCY: 2x/week   PT DURATION: 6 weeks   PLANNED INTERVENTIONS: Therapeutic exercises, Therapeutic activity, Neuromuscular re-education, Balance training, Gait training, Patient/Family education, Joint manipulation, Joint mobilization, Aquatic Therapy, Dry Needling, Electrical stimulation, Cryotherapy, Moist heat, Ionotophoresis 4mg /ml Dexamethasone, and Manual therapy.   PLAN FOR NEXT SESSION: core strength, hip/core stability - iso/bridges    2:32 PM, 03/22/22 05/23/22, DPT Physical Therapy with [facility]

## 2022-03-27 ENCOUNTER — Ambulatory Visit (INDEPENDENT_AMBULATORY_CARE_PROVIDER_SITE_OTHER): Payer: BC Managed Care – PPO | Admitting: Physical Therapy

## 2022-03-27 ENCOUNTER — Encounter: Payer: Self-pay | Admitting: Physical Therapy

## 2022-03-27 DIAGNOSIS — M6281 Muscle weakness (generalized): Secondary | ICD-10-CM | POA: Diagnosis not present

## 2022-03-27 DIAGNOSIS — M5459 Other low back pain: Secondary | ICD-10-CM

## 2022-03-27 NOTE — Therapy (Addendum)
OUTPATIENT PHYSICAL THERAPY TREATMENT NOTE PHYSICAL THERAPY DISCHARGE SUMMARY  Visits from Start of Care: 6  Current functional level related to goals / functional outcomes: Unable to assess due to unplanned discharge   Remaining deficits: Unable to assess due to unplanned discharge    Education / Equipment: Unable to assess due to unplanned discharge    Patient agrees to discharge. Patient goals were not met. Patient is being discharged due to not returning since the last visit.  12:33 PM, 07/03/22 Jerene Pitch, DPT Physical Therapy with [facility]    Patient Name: Kelsey [name] MRN: [id number] DOB:1976-06-27, 46 y.o., female Today's Date: 03/27/2022   END OF SESSION:   PT End of Session - 03/27/22 0935     Visit Number 6    Number of Visits 12    Authorization Type BCBS    PT Start Time [phone]    PT Stop Time 1025    PT Time Calculation (min) 48 min             Past Medical History:  Diagnosis Date   Hypothyroidism    Past Surgical History:  Procedure Laterality Date   APPENDECTOMY     DILATION AND CURETTAGE OF UTERUS     LAPAROSCOPIC OVARIAN CYSTECTOMY     Patient Active Problem List   Diagnosis Date Noted   Obesity 02/10/2022   Anxiety 07/28/2021   Neck pain 07/28/2021   Nonintractable headache 07/28/2021   Insomnia 07/28/2021    PCP: Dimas Chyle   REFERRING PROVIDER: Vivi Barrack, MD   REFERRING DIAG: M54.50 (ICD-10-CM) - Low back pain, unspecified back pain laterality, unspecified chronicity, unspecified whether sciatica present   Rationale for Evaluation and Treatment Rehabilitation   THERAPY DIAG:  Low back pain, unspecified back pain laterality, unspecified chronicity, unspecified whether sciatica present   Muscle weakness (generalized)   ONSET DATE: 3 months ago   SUBJECTIVE:                                                                                                                                                                                             SUBJECTIVE STATEMENT: 03/27/2022 State her right side is still locked up. States she felt a little better. States that overall better but it is constant pain. States she can't stay in one position. Reports the belt helps and she feels more stability.   Eval: States that she had back pain about 3 months ago and had a shot in her back as she could barely walk. States that she used to run and now she can't. States she has been trying to lose weight. States  that she has been lifting and thinks her form was off. 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 States that about 2 weeks ago. States that she tried to run and she felt fine and the next day she couldn't walk and her pain was in her right low back and into her buttocks. States that she feels it reside in her right buttocks. States that her back pain never completely resolved but felt a little better. States mornings are the worse and now she has difficulties  bending down and when she tries to get up it feels like it pinches and it takes her breath away. States has tried cupping, massage. Does a LTR stretch, piriformis stretch.    PERTINENT HISTORY:  No significant history   PAIN:  Are you having pain? Yes: NPRS scale: 6/10 Pain location: right low back and buttocks Pain description: dull, sharp Aggravating factors: forward bending, running, AM  Relieving factors: rest, injections     PRECAUTIONS: None   WEIGHT BEARING RESTRICTIONS No   FALLS:  Has patient fallen in last 6 months? 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   PLOF: Independent   PATIENT GOALS to have less pain and to be able to continue to work on weight loss goals     OBJECTIVE:        SCREENING FOR RED FLAGS: Bowel or bladder incontinence: No Spinal tumors: No Cauda equina syndrome: No Compression fracture: No Abdominal aneurysm: No   COGNITION:           Overall cognitive status: Within functional limits  for tasks assessed                          SENSATION: WFL   MUSCLE LENGTH: Hamstrings: Right 0 deg; Left 0 deg     POSTURE: rounded shoulders and forward head   PALPATION: Tenderness to palpation along right glutes, WL, pain with spring testing in lumbar spine (hyper mobility noted)   LUMBAR ROM:    Active  A/PROM  eval  Flexion 50% limited- pain coming up  Extension 75% limited tight  Right lateral flexion 50% limited  Left lateral flexion 50% limited  Right rotation    Left rotation     (Blank rows = not tested)                LE Measurements       Lower Extremity Right 02/20/2022 Left 02/20/2022    A/PROM MMT A/PROM MMT  Hip Flexion WFL 4*   4  Hip Extension   4-*   4-*  Hip Abduction          Hip Adduction          Hip Internal rotation [facility]   [facility]    Hip External rotation [facility]   WFL    Knee Flexion   3+   3+  Knee Extension   4   4+  Ankle Dorsiflexion          Ankle Plantarflexion          Ankle Inversion          Ankle Eversion           (Blank rows = not tested)            * pain     LUMBAR SPECIAL TESTS:  Straight leg raise test: Negative, Slump test: Negative, SI Compression/distraction test: Positive, FABER test: Negative,  and ely's Neg   FUNCTIONAL TESTS:  Bed mobilities - painful         TODAY'S TREATMENT  03/27/2022 Therapeutic Exercise:    Aerobic:    Seated:    prone belly breathing 6 minutes, bridge with hip abd with breathing x15 5" holds  Neuromuscular Re-education:  Manual Therapy: STM BR glutes/QL/hip ER in prone, hip add/flexors in supine Therapeutic Activity: Self Care: Trigger Point Dry Needling:  Modalities: thermo-therapy to lumbopelvic region during STM      PATIENT EDUCATION:  Education details: on HEP, on anatomy, on self massage, on landmarks, on exhaling prior to movement Person educated: Patient Education method: Explanation, Demonstration, and Handouts Education comprehension: verbalized understanding        HOME EXERCISE PROGRAM: [id number]   ASSESSMENT:   CLINICAL IMPRESSION: 03/27/2022 Session focused on pain management. Tolerated manual well. Added stability work and educated patient on anatomy and on muscles to focus on activation. No pain noted end of session. Will continue with current POC as tolerated.  Eval: Patient is a 46 y.o. female who was seen today for physical therapy evaluation and treatment for low back pain. Patient presents with instability and pain along her SIJ and demonstrated reduced pain with compression at her pelvis. Educated patient in current condition, presentation and plan moving forward. Patient would greatly benefit from skilled PT to improve overall function and QOL. .      OBJECTIVE IMPAIRMENTS decreased activity tolerance, decreased endurance, decreased mobility, difficulty walking, decreased ROM, decreased strength, hypomobility, postural dysfunction, and pain.    ACTIVITY LIMITATIONS carrying, lifting, bending, standing, bed mobility, and dressing   PARTICIPATION LIMITATIONS: cleaning and occupation   PERSONAL FACTORS Age are also affecting patient's functional outcome.    REHAB POTENTIAL: Good   CLINICAL DECISION MAKING: Stable/uncomplicated   EVALUATION COMPLEXITY: Moderate     GOALS: Goals reviewed with patient?  yes   SHORT TERM GOALS:   Patient will be independent in self management strategies to improve quality of life and functional outcomes. Baseline: new program Target date: 03/13/2022 Goal status: INITIAL   2.  Patient will report at least 50% improvement in overall symptoms and/or function to demonstrate improved functional mobility Baseline: 0% Target date: 03/13/2022 Goal status: INITIAL   3.  Patient will be able to perform all bed mobilities without pain Baseline: new program Target date: 03/13/2022 Goal status: INITIAL         LONG TERM GOALS:   Patient will report at least 75% improvement in overall symptoms and/or  function to demonstrate improved functional mobility Baseline: 0% Target date: 04/03/2022 Goal status: INITIAL   2.  Patient will be able to demonstrate painfree lumbar motions Baseline: painful Target date: 04/03/2022 Goal status: INITIAL   3.  Patient will be able to participate in all recreational activities without difficulties to return to optimal function Baseline: unable Target date: 04/03/2022 Goal status: INITIAL   PLAN: PT FREQUENCY: 2x/week   PT DURATION: 6 weeks   PLANNED INTERVENTIONS: Therapeutic exercises, Therapeutic activity, Neuromuscular re-education, Balance training, Gait training, Patient/Family education, Joint manipulation, Joint mobilization, Aquatic Therapy, Dry Needling, Electrical stimulation, Cryotherapy, Moist heat, Ionotophoresis 7m/ml Dexamethasone, and Manual therapy.   PLAN FOR NEXT SESSION: core strength, hip/core stability - iso/bridges    10:40 AM, 03/27/22 MJerene Pitch DPT Physical Therapy with [facility]

## 2022-03-29 ENCOUNTER — Encounter: Payer: BC Managed Care – PPO | Admitting: Physical Therapy

## 2022-04-04 ENCOUNTER — Encounter: Payer: BC Managed Care – PPO | Admitting: Physical Therapy

## 2022-06-06 DIAGNOSIS — H524 Presbyopia: Secondary | ICD-10-CM | POA: Diagnosis not present

## 2022-06-06 DIAGNOSIS — H18593 Other hereditary corneal dystrophies, bilateral: Secondary | ICD-10-CM | POA: Diagnosis not present

## 2022-06-06 DIAGNOSIS — H52223 Regular astigmatism, bilateral: Secondary | ICD-10-CM | POA: Diagnosis not present

## 2022-06-06 DIAGNOSIS — H5203 Hypermetropia, bilateral: Secondary | ICD-10-CM | POA: Diagnosis not present

## 2022-06-12 ENCOUNTER — Encounter: Payer: Self-pay | Admitting: *Deleted

## 2022-07-07 ENCOUNTER — Ambulatory Visit: Payer: BC Managed Care – PPO | Admitting: Family Medicine

## 2022-07-07 ENCOUNTER — Encounter: Payer: Self-pay | Admitting: Family Medicine

## 2022-07-07 VITALS — BP 100/68 | HR 82 | Temp 98.1°F | Ht 62.0 in | Wt 158.6 lb

## 2022-07-07 DIAGNOSIS — M546 Pain in thoracic spine: Secondary | ICD-10-CM | POA: Diagnosis not present

## 2022-07-07 DIAGNOSIS — S29012A Strain of muscle and tendon of back wall of thorax, initial encounter: Secondary | ICD-10-CM

## 2022-07-07 MED ORDER — TRAMADOL HCL 50 MG PO TABS: 50.0000 mg | ORAL_TABLET | Freq: Four times a day (QID) | ORAL | 0 refills | Status: DC | PRN

## 2022-07-07 MED ORDER — KETOROLAC TROMETHAMINE 60 MG/2ML IM SOLN
60.0000 mg | Freq: Once | INTRAMUSCULAR | Status: AC
  Administered 2022-07-07: 60 mg via INTRAMUSCULAR

## 2022-07-07 MED ORDER — DICLOFENAC SODIUM 75 MG PO TBEC
75.0000 mg | DELAYED_RELEASE_TABLET | Freq: Two times a day (BID) | ORAL | 0 refills | Status: DC
Start: 2022-07-07 — End: 2022-09-07

## 2022-07-07 NOTE — Progress Notes (Signed)
Subjective  CC:  Chief Complaint  Patient presents with   Back Pain    Pt stated that she has been having some upper back pain for the past 3 days.   Same day acute visit; PCP not available. New pt to me. Chart reviewed.   HPI: Kelsey [name] is a 46 y.o. female who presents to the office today to address the problems listed above in the chief complaint. 46 year old with right upper thoracic back pain, feels subscapular.  No injury.  Significant spasms, causing severe pain.  No pain with breathing or shortness of breath.  No cough.  No recent overuse.  She does exercise.  She does have history of low back pain.  This is a recurrent problem.  No known DJD.  Taking over-the-counter medicines without relief  Assessment  1. Acute right-sided thoracic back pain   2. Rhomboid muscle strain, initial encounter      Plan  Right-sided thoracic back pain, possible rhomboid strain: Education, start moist heat, physical therapy, Toradol given for pain relief in the office, she tolerated well.  Recommend diclofenac twice daily, tramadol if needed for worsening pain.  Follow-up if not proving.  Follow up: If needed Visit date not found  Orders Placed This Encounter  Procedures   Ambulatory referral to Physical Therapy   Meds ordered this encounter  Medications   ketorolac (TORADOL) injection 60 mg   diclofenac (VOLTAREN) 75 MG EC tablet    Sig: Take 1 tablet (75 mg total) by mouth 2 (two) times daily.    Dispense:  30 tablet    Refill:  0   traMADol (ULTRAM) 50 MG tablet    Sig: Take 1 tablet (50 mg total) by mouth every 6 (six) hours as needed for moderate pain.    Dispense:  30 tablet    Refill:  0      I reviewed the patients updated PMH, FH, and SocHx.    Patient Active Problem List   Diagnosis Date Noted   Obesity 02/10/2022   Anxiety 07/28/2021   Neck pain 07/28/2021   Nonintractable headache 07/28/2021   Insomnia 07/28/2021   Current Meds  Medication Sig    cyclobenzaprine (FLEXERIL) 10 MG tablet Take 1 tablet (10 mg total) by mouth 3 (three) times daily as needed for muscle spasms.   gabapentin (NEURONTIN) 100 MG capsule Take 1 capsule (100 mg total) by mouth 3 (three) times daily.   Mag Oxide-Vit D3-Turmeric (MAGNESIUM-VITAMIN D3-TURMERIC) [id number] MG-UNIT-MG TABS Take by mouth.   Multiple Vitamin (MULTIVITAMIN) capsule Take 1 capsule by mouth daily.   tirzepatide [facility]) 2.5 MG/0.5ML Pen Inject 2.5 mg into the skin once a week. For 4 weeks   traMADol (ULTRAM) 50 MG tablet Take 1 tablet (50 mg total) by mouth every 6 (six) hours as needed for moderate pain.   traZODone (DESYREL) 50 MG tablet TAKE 0.5-1 TABLETS BY MOUTH AT BEDTIME AS NEEDED FOR SLEEP.   [DISCONTINUED] diclofenac (VOLTAREN) 75 MG EC tablet Take 1 tablet (75 mg total) by mouth 2 (two) times daily.    Allergies: Patient has No Known Allergies. Family History: Patient family history includes Diabetes in her mother and sister; Heart disease in her father; Hyperlipidemia in her father and mother; Hypertension in her father and mother. Social History:  Patient  reports that she has never smoked. She has never used smokeless tobacco. She reports that she does not drink alcohol and does not use drugs.  Review of Systems: Constitutional: Negative for  fever malaise or anorexia Cardiovascular: negative for chest pain Respiratory: negative for SOB or persistent cough Gastrointestinal: negative for abdominal pain  Objective  Vitals: BP 100/68   Pulse 82   Temp 98.1 F (36.7 C)   Ht 5\' 2"  (1.575 m)   Wt 158 lb 9.6 oz (71.9 kg)   SpO2 96%   BMI 29.01 kg/m  General: no acute distress , A&Ox3, appears uncomfortable Back: Right rhomboid tender, no spinal tenderness, no spasm appreciated.  Low back with full range of motion.    Commons side effects, risks, benefits, and alternatives for medications and treatment plan prescribed today were discussed, and the patient expressed  understanding of the given instructions. Patient is instructed to call or message via MyChart if he/she has any questions or concerns regarding our treatment plan. No barriers to understanding were identified. We discussed Red Flag symptoms and signs in detail. Patient expressed understanding regarding what to do in case of urgent or emergency type symptoms.  Medication list was reconciled, printed and provided to the patient in AVS. Patient instructions and summary information was reviewed with the patient as documented in the AVS. This note was prepared with assistance of Dragon voice recognition software. Occasional wrong-word or sound-a-like substitutions may have occurred due to the inherent limitations of voice recognition software  This visit occurred during the SARS-CoV-2 public health emergency.  Safety protocols were in place, including screening questions prior to the visit, additional usage of staff PPE, and extensive cleaning of exam room while observing appropriate contact time as indicated for disinfecting solutions.

## 2022-07-07 NOTE — Patient Instructions (Addendum)
Please follow up if symptoms do not improve or as needed.    We will call you to get you set up with PT  Thoracic Strain A thoracic strain, which is sometimes called a mid-back strain, is an injury to the muscles or tendons that attach to the upper part of your back behind your chest. This type of injury occurs when a muscle is overstretched or overloaded. Thoracic strains can range from mild to severe. Mild strains may involve stretching a muscle or tendon without tearing it. These injuries may heal in 1-2 weeks. More severe strains involve tearing of muscle fibers or tendons. These will cause more pain and may take 6-8 weeks to heal. What are the causes? This condition may be caused by: Trauma, such as a fall or a hit to the body. Twisting or overstretching the back. This may result from doing activities that require a lot of energy, such as lifting heavy objects. In some cases, the cause may not be known. What increases the risk? This injury is more common in: Athletes. People with obesity. What are the signs or symptoms? The main symptom of this condition is pain in the middle back, especially with movement. Other symptoms include: Stiffness or limited range of motion. Sudden muscle tightening (spasms). How is this diagnosed? This condition may be diagnosed based on: Your symptoms. Your medical history. A physical exam. Imaging tests, such as X-rays or an MRI. How is this treated? This condition may be treated with: Resting the injured area. Applying heat and cold to the injured area. Over-the-counter medicines for pain and inflammation, such as NSAIDs. Prescription pain medicine or muscle relaxants may be needed for a short time. Physical therapy. This will involve doing stretching and strengthening exercises. Follow these instructions at home: Managing pain, stiffness, and swelling     If directed, put ice on the injured area. Put ice in a plastic bag. Place a towel  between your skin and the bag. Leave the ice on for 20 minutes, 2-3 times a day. If directed, apply heat to the affected area as often as told by your health care provider. Use the heat source that your health care provider recommends, such as a moist heat pack or a heating pad. Place a towel between your skin and the heat source. Leave the heat on for 20-30 minutes. Remove the heat if your skin turns bright red. This is especially important if you are unable to feel pain, heat, or cold. You may have a greater risk of getting burned. Activity Rest and return to your normal activities as told by your health care provider. Ask your health care provider what activities are safe for you. Do exercises as told by your health care provider. Medicines Take over-the-counter and prescription medicines only as told by your health care provider. Ask your health care provider if the medicine prescribed to you: Requires you to avoid driving or using heavy machinery. Can cause constipation. You may need to take these actions to prevent or treat constipation: Drink enough fluid to keep your urine pale yellow. Take over-the-counter or prescription medicines. Eat foods that are high in fiber, such as beans, whole grains, and fresh fruits and vegetables. Limit foods that are high in fat and processed sugars, such as fried or sweet foods. Injury prevention To prevent a future mid-back injury: Always warm up properly before physical activity or sports. Cool down and stretch after being active. Use correct form when playing sports and lifting heavy objects.  Bend your knees before you lift heavy objects. Use good posture when sitting and standing. Stay physically fit and maintain a healthy weight. Do at least 150 minutes of moderate-intensity exercise each week, such as brisk walking or water aerobics. Do strength exercises at least 2 times each week.  General instructions Do not use any products that contain  nicotine or tobacco, such as cigarettes, e-cigarettes, and chewing tobacco. If you need help quitting, ask your health care provider. Keep all follow-up visits as told by your health care provider. This is important. Contact a health care provider if: Your pain is not helped by medicine. Your pain or stiffness is getting worse. You develop pain or stiffness in your neck or lower back. Get help right away if you: Have shortness of breath. Have chest pain. Develop numbness or weakness in your legs or arms. Have involuntary loss of urine (urinary incontinence). Summary A thoracic strain, which is sometimes called a mid-back strain, is an injury to the muscles or tendons that attach to the upper part of your back behind your chest. This type of injury occurs when a muscle is overstretched or overloaded. Rest and return to your normal activities as told by your health care provider. If directed, apply heat or ice to the affected area as often as told by your health care provider. Take over-the-counter and prescription medicines only as told by your health care provider. Contact a health care provider if you have new or worsening symptoms. This information is not intended to replace advice given to you by your health care provider. Make sure you discuss any questions you have with your health care provider. Document Revised: 07/06/2021 Document Reviewed: 07/06/2021 Elsevier Patient Education  [place].

## 2022-08-31 ENCOUNTER — Encounter: Payer: Self-pay | Admitting: *Deleted

## 2022-09-07 ENCOUNTER — Encounter: Payer: Self-pay | Admitting: Family Medicine

## 2022-09-07 ENCOUNTER — Ambulatory Visit: Payer: BC Managed Care – PPO | Admitting: Family Medicine

## 2022-09-07 VITALS — BP 107/71 | HR 64 | Temp 98.2°F | Ht 62.0 in | Wt 157.4 lb

## 2022-09-07 DIAGNOSIS — Z13220 Encounter for screening for lipoid disorders: Secondary | ICD-10-CM

## 2022-09-07 DIAGNOSIS — M542 Cervicalgia: Secondary | ICD-10-CM

## 2022-09-07 DIAGNOSIS — E669 Obesity, unspecified: Secondary | ICD-10-CM

## 2022-09-07 DIAGNOSIS — Z131 Encounter for screening for diabetes mellitus: Secondary | ICD-10-CM | POA: Diagnosis not present

## 2022-09-07 DIAGNOSIS — G4700 Insomnia, unspecified: Secondary | ICD-10-CM | POA: Diagnosis not present

## 2022-09-07 DIAGNOSIS — Z1211 Encounter for screening for malignant neoplasm of colon: Secondary | ICD-10-CM

## 2022-09-07 DIAGNOSIS — F419 Anxiety disorder, unspecified: Secondary | ICD-10-CM | POA: Diagnosis not present

## 2022-09-07 DIAGNOSIS — Z0001 Encounter for general adult medical examination with abnormal findings: Secondary | ICD-10-CM | POA: Diagnosis not present

## 2022-09-07 DIAGNOSIS — R202 Paresthesia of skin: Secondary | ICD-10-CM | POA: Diagnosis not present

## 2022-09-07 LAB — COMPREHENSIVE METABOLIC PANEL
ALT: 11 U/L (ref 0–35)
AST: 13 U/L (ref 0–37)
Albumin: 4.5 g/dL (ref 3.5–5.2)
Alkaline Phosphatase: 42 U/L (ref 39–117)
BUN: 12 mg/dL (ref 6–23)
CO2: 28 mEq/L (ref 19–32)
Calcium: 9.8 mg/dL (ref 8.4–10.5)
Chloride: 107 mEq/L (ref 96–112)
Creatinine, Ser: 0.81 mg/dL (ref 0.40–1.20)
GFR: 87.86 mL/min (ref >60.00)
Glucose, Bld: 94 mg/dL (ref 70–99)
Potassium: 5.6 mEq/L — ABNORMAL HIGH (ref 3.5–5.1)
Sodium: 143 mEq/L (ref 135–145)
Total Bilirubin: 1.1 mg/dL (ref 0.2–1.2)
Total Protein: 7 g/dL (ref 6.0–8.3)

## 2022-09-07 LAB — LIPID PANEL
Cholesterol: 173 mg/dL (ref 0–200)
HDL: 61 mg/dL (ref >39.00)
LDL Cholesterol: 99 mg/dL (ref 0–99)
NonHDL: 112.42
Total CHOL/HDL Ratio: 3
Triglycerides: 68 mg/dL (ref 0.0–149.0)
VLDL: 13.6 mg/dL (ref 0.0–40.0)

## 2022-09-07 LAB — HEMOGLOBIN A1C: Hgb A1c MFr Bld: 5.5 % (ref 4.6–6.5)

## 2022-09-07 LAB — CBC
HCT: 39.2 % (ref 36.0–46.0)
Hemoglobin: 12.9 g/dL (ref 12.0–15.0)
MCHC: 32.8 g/dL (ref 30.0–36.0)
MCV: 92 fl (ref 78.0–100.0)
Platelets: 325 10*3/uL (ref 150.0–400.0)
RBC: 4.26 Mil/uL (ref 3.87–5.11)
RDW: 14.3 % (ref 11.5–15.5)
WBC: 4.5 10*3/uL (ref 4.0–10.5)

## 2022-09-07 LAB — VITAMIN D 25 HYDROXY (VIT D DEFICIENCY, FRACTURES): VITD: 27.08 ng/mL — ABNORMAL LOW (ref 30.00–100.00)

## 2022-09-07 LAB — VITAMIN B12: Vitamin B-12: 241 pg/mL (ref 211–911)

## 2022-09-07 LAB — TSH: TSH: 1.03 u[IU]/mL (ref 0.35–5.50)

## 2022-09-07 MED ORDER — DICLOFENAC SODIUM 75 MG PO TBEC: 75.0000 mg | DELAYED_RELEASE_TABLET | Freq: Two times a day (BID) | ORAL | 0 refills | Status: DC

## 2022-09-07 MED ORDER — CYCLOBENZAPRINE HCL 10 MG PO TABS: 10.0000 mg | ORAL_TABLET | Freq: Three times a day (TID) | ORAL | 0 refills | Status: DC | PRN

## 2022-09-07 NOTE — Assessment & Plan Note (Signed)
No red flags.  Reassuring exam today.  She has done well with PT in the past however given recurrent nature we will place referral to sports medicine for further evaluation and management.  Will refill her Flexeril and diclofenac today.  Both of these work well for her.

## 2022-09-07 NOTE — Assessment & Plan Note (Signed)
Positive Tinel sign on exam-likely has some mild component of ulnar neuropathy.  She also has a lot of underlying issues with recurrent neck and back pain and may have some cervical radiculopathy as well.  Given the recurrent nature of her neck and back pain place referral to sports medicine for further evaluation.  She has done well with the PT in the past.

## 2022-09-07 NOTE — Progress Notes (Signed)
Chief Complaint:  Kelsey [name] is a 46 y.o. female who presents today for her annual comprehensive physical exam.    Assessment/Plan:  Chronic Problems Addressed Today: Anxiety Symptoms are not fully controlled.  Overall seem to be mostly manageable though she is interested in seeking further treatment options at this point.  We discussed medications versus therapy.  She would like to avoid any new medications at this point.  She is taking trazodone 25 to 50 mg daily at sleep.  She is agreeable to see a therapist.  Will place referral today.  Insomnia Worsened by her anxiety as above.  She is taking trazodone nightly which does give her some modest benefit.  Will place referral for her to see therapist as above.  Paresthesia Positive Tinel sign on exam-likely has some mild component of ulnar neuropathy.  She also has a lot of underlying issues with recurrent neck and back pain and may have some cervical radiculopathy as well.  Given the recurrent nature of her neck and back pain place referral to sports medicine for further evaluation.  She has done well with the PT in the past.  Neck pain No red flags.  Reassuring exam today.  She has done well with PT in the past however given recurrent nature we will place referral to sports medicine for further evaluation and management.  Will refill her Flexeril and diclofenac today.  Both of these work well for her.  Obesity On Mounjaro.  This is prescribed by a physician in [place].  BMI is 28 today.  Preventative Healthcare: Will refer for colonoscopy.  Check labs.  Gets Pap and mammogram through OB/GYN.  Patient Counseling(The following topics were reviewed and/or handout was given):  -Nutrition: Stressed importance of moderation in sodium/caffeine intake, saturated fat and cholesterol, caloric balance, sufficient intake of fresh fruits, vegetables, and fiber.  -Stressed the importance of regular exercise.   -Substance Abuse: Discussed  cessation/primary prevention of tobacco, alcohol, or other drug use; driving or other dangerous activities under the influence; availability of treatment for abuse.   -Injury prevention: Discussed safety belts, safety helmets, smoke detector, smoking near bedding or upholstery.   -Sexuality: Discussed sexually transmitted diseases, partner selection, use of condoms, avoidance of unintended pregnancy and contraceptive alternatives.   -Dental health: Discussed importance of regular tooth brushing, flossing, and dental visits.  -Health maintenance and immunizations reviewed. Please refer to Health maintenance section.  Return to care in 1 year for next preventative visit.     Subjective:  HPI:  She has no acute complaints today. See A/p for status of chronic conditions.   Patient last seen here about 7 months ago.  Since our last visit she has had ongoing issues with the recurrent neck and back pain.  Symptoms are usually short-lived.  She has seen physical therapy a few times for this which has worked well.  She is still consistent with doing her daily exercises at home.  She does admit she has been under more stress recently.  She has been taking trazodone to help with sleep though does frequently wake up in the night feeling overwhelmed and stressed.  No reported SI or HI.  She occasionally gets a sensation in her arms where light touch is uncomfortable.  This happens sporadically.  It does not last for long time.  Lifestyle Diet: Balanced. Plenty of fruits and vegetables.  Exercise: Getting back into running.      07/07/2022   11:30 AM  Depression screen [facility] 2/9  Decreased Interest 0  Down, Depressed, Hopeless 0  PHQ - 2 Score 0    Health Maintenance Due  Topic Date Due   COLONOSCOPY (Pts 45-23yrs Insurance coverage will need to be confirmed)  Never done     ROS: Per HPI, otherwise a complete review of systems was negative.   PMH:  The following were reviewed and  entered/updated in epic: Past Medical History:  Diagnosis Date   Hypothyroidism    Patient Active Problem List   Diagnosis Date Noted   Paresthesia 09/07/2022   Obesity 02/10/2022   Anxiety 07/28/2021   Neck pain 07/28/2021   Nonintractable headache 07/28/2021   Insomnia 07/28/2021   Past Surgical History:  Procedure Laterality Date   APPENDECTOMY     DILATION AND CURETTAGE OF UTERUS     LAPAROSCOPIC OVARIAN CYSTECTOMY      Family History  Problem Relation Age of Onset   Diabetes Mother    Hyperlipidemia Mother    Hypertension Mother    Heart disease Father    Hyperlipidemia Father    Hypertension Father    Diabetes Sister     Medications- reviewed and updated Current Outpatient Medications  Medication Sig Dispense Refill   Mag Oxide-Vit D3-Turmeric (MAGNESIUM-VITAMIN D3-TURMERIC) [phone] MG-UNIT-MG TABS Take by mouth.     Multiple Vitamin (MULTIVITAMIN) capsule Take 1 capsule by mouth daily.     Tirzepatide [facility]) Inject into the skin.     traZODone (DESYREL) 50 MG tablet TAKE 0.5-1 TABLETS BY MOUTH AT BEDTIME AS NEEDED FOR SLEEP. 90 tablet 2   cyclobenzaprine (FLEXERIL) 10 MG tablet Take 1 tablet (10 mg total) by mouth 3 (three) times daily as needed for muscle spasms. 30 tablet 0   diclofenac (VOLTAREN) 75 MG EC tablet Take 1 tablet (75 mg total) by mouth 2 (two) times daily. 30 tablet 0   No current facility-administered medications for this visit.    Allergies-reviewed and updated No Known Allergies  Social History   Socioeconomic History   Marital status: Married    Spouse name: Not on file   Number of children: Not on file   Years of education: Not on file   Highest education level: Not on file  Occupational History   Not on file  Tobacco Use   Smoking status: Never   Smokeless tobacco: Never  Substance and Sexual Activity   Alcohol use: No   Drug use: No   Sexual activity: Not on file  Other Topics Concern   Not on file  Social  History Narrative   Not on file   Social Determinants of Health   Financial Resource Strain: Not on file  Food Insecurity: Not on file  Transportation Needs: Not on file  Physical Activity: Not on file  Stress: Not on file  Social Connections: Not on file        Objective:  Physical Exam: BP 107/71   Pulse 64   Temp 98.2 F (36.8 C) (Temporal)   Ht 5\' 2"  (1.575 m)   Wt 157 lb 6.4 oz (71.4 kg)   LMP 09/04/2022   SpO2 100%   BMI 28.79 kg/m   Body mass index is 28.79 kg/m. Wt Readings from Last 3 Encounters:  09/07/22 157 lb 6.4 oz (71.4 kg)  07/07/22 158 lb 9.6 oz (71.9 kg)  03/20/22 167 lb 9.6 oz (76 kg)   Gen: NAD, resting comfortably HEENT: TMs normal bilaterally. OP clear. No thyromegaly noted.  CV: RRR with no murmurs appreciated Pulm: NWOB,  CTAB with no crackles, wheezes, or rhonchi GI: Normal bowel sounds present. Soft, Nontender, Nondistended. MSK: no edema, cyanosis, or clubbing noted - Back: No deformities.  Tenderness to palpation along right upper thoracic and lower cervical paraspinal muscles - Right Arm: No deformities.  Neurovascular intact distally.  Strength 5 out of 5 in all fields.  Positive Tinel sign at medial epicondyle. - Left arm: No deformities.  Neurovascular intact distally.  Strength 5 out of 5 in all fields.  Positive Tinel sign at medial epicondyle. Skin: warm, dry Neuro: CN2-12 grossly intact. Strength 5/5 in upper and lower extremities. Reflexes symmetric and intact bilaterally.  Psych: Normal affect and thought content     Pia Jedlicka M. [name], MD 09/07/2022 9:49 AM

## 2022-09-07 NOTE — Patient Instructions (Signed)
It was very nice to see you today!  We will check blood work today.  I will refer you to see a stress management specialist.  Also refer you for your colonoscopy.  I will refer you to see a sports medicine doctor to discuss the pinched nerve and pain in your neck and back.  Please keep up the great work with your diet and exercise.  Will see you back in a year for your next physical.  Please come back to see [place] sooner if needed.  Take care, Dr Jerline Pain  PLEASE NOTE:  If you had any lab tests, please let us know if you have not heard back within a few days. You may see your results on mychart before we have a chance to review them but we will give you a call once they are reviewed by [place].   If we ordered any referrals today, please let us know if you have not heard from their office within the next week.   If you had any urgent prescriptions sent in today, please check with the pharmacy within an hour of our visit to make sure the prescription was transmitted appropriately.   Please try these tips to maintain a healthy lifestyle:  Eat at least 3 REAL meals and 1-2 snacks per day.  Aim for no more than 5 hours between eating.  If you eat breakfast, please do so within one hour of getting up.   Each meal should contain half fruits/vegetables, one quarter protein, and one quarter carbs (no bigger than a computer mouse)  Cut down on sweet beverages. This includes juice, soda, and sweet tea.   Drink at least 1 glass of water with each meal and aim for at least 8 glasses per day  Exercise at least 150 minutes every week.    Preventive Care 72-9 Years Old, Female Preventive care refers to lifestyle choices and visits with your health care provider that can promote health and wellness. Preventive care visits are also called wellness exams. What can I expect for my preventive care visit? Counseling Your health care provider may ask you questions about your: Medical history,  including: Past medical problems. Family medical history. Pregnancy history. Current health, including: Menstrual cycle. Method of birth control. Emotional well-being. Home life and relationship well-being. Sexual activity and sexual health. Lifestyle, including: Alcohol, nicotine or tobacco, and drug use. Access to firearms. Diet, exercise, and sleep habits. Work and work Statistician. Sunscreen use. Safety issues such as seatbelt and bike helmet use. Physical exam Your health care provider will check your: Height and weight. These may be used to calculate your BMI (body mass index). BMI is a measurement that tells if you are at a healthy weight. Waist circumference. This measures the distance around your waistline. This measurement also tells if you are at a healthy weight and may help predict your risk of certain diseases, such as type 2 diabetes and high blood pressure. Heart rate and blood pressure. Body temperature. Skin for abnormal spots. What immunizations do I need?  Vaccines are usually given at various ages, according to a schedule. Your health care provider will recommend vaccines for you based on your age, medical history, and lifestyle or other factors, such as travel or where you work. What tests do I need? Screening Your health care provider may recommend screening tests for certain conditions. This may include: Lipid and cholesterol levels. Diabetes screening. This is done by checking your blood sugar (glucose) after you have not  eaten for a while (fasting). Pelvic exam and Pap test. Hepatitis B test. Hepatitis C test. HIV (human immunodeficiency virus) test. STI (sexually transmitted infection) testing, if you are at risk. Lung cancer screening. Colorectal cancer screening. Mammogram. Talk with your health care provider about when you should start having regular mammograms. This may depend on whether you have a family history of breast cancer. BRCA-related  cancer screening. This may be done if you have a family history of breast, ovarian, tubal, or peritoneal cancers. Bone density scan. This is done to screen for osteoporosis. Talk with your health care provider about your test results, treatment options, and if necessary, the need for more tests. Follow these instructions at home: Eating and drinking  Eat a diet that includes fresh fruits and vegetables, whole grains, lean protein, and low-fat dairy products. Take vitamin and mineral supplements as recommended by your health care provider. Do not drink alcohol if: Your health care provider tells you not to drink. You are pregnant, may be pregnant, or are planning to become pregnant. If you drink alcohol: Limit how much you have to 0-1 drink a day. Know how much alcohol is in your drink. In the [place] one drink equals one 12 oz bottle of beer (355 mL), one 5 oz glass of wine (148 mL), or one 1 oz glass of hard liquor (44 mL). Lifestyle Brush your teeth every morning and night with fluoride toothpaste. Floss one time each day. Exercise for at least 30 minutes 5 or more days each week. Do not use any products that contain nicotine or tobacco. These products include cigarettes, chewing tobacco, and vaping devices, such as e-cigarettes. If you need help quitting, ask your health care provider. Do not use drugs. If you are sexually active, practice safe sex. Use a condom or other form of protection to prevent STIs. If you do not wish to become pregnant, use a form of birth control. If you plan to become pregnant, see your health care provider for a prepregnancy visit. Take aspirin only as told by your health care provider. Make sure that you understand how much to take and what form to take. Work with your health care provider to find out whether it is safe and beneficial for you to take aspirin daily. Find healthy ways to manage stress, such as: Meditation, yoga, or listening to  music. Journaling. Talking to a trusted person. Spending time with friends and family. Minimize exposure to UV radiation to reduce your risk of skin cancer. Safety Always wear your seat belt while driving or riding in a vehicle. Do not drive: If you have been drinking alcohol. Do not ride with someone who has been drinking. When you are tired or distracted. While texting. If you have been using any mind-altering substances or drugs. Wear a helmet and other protective equipment during sports activities. If you have firearms in your house, make sure you follow all gun safety procedures. Seek help if you have been physically or sexually abused. What's next? Visit your health care provider once a year for an annual wellness visit. Ask your health care provider how often you should have your eyes and teeth checked. Stay up to date on all vaccines. This information is not intended to replace advice given to you by your health care provider. Make sure you discuss any questions you have with your health care provider. Document Revised: 03/02/2021 Document Reviewed: 03/02/2021 Elsevier Patient Education  [place].

## 2022-09-07 NOTE — Assessment & Plan Note (Signed)
Worsened by her anxiety as above.  She is taking trazodone nightly which does give her some modest benefit.  Will place referral for her to see therapist as above.

## 2022-09-07 NOTE — Assessment & Plan Note (Signed)
On Mounjaro.  This is prescribed by a physician in [place].  BMI is 28 today.

## 2022-09-07 NOTE — Assessment & Plan Note (Signed)
Symptoms are not fully controlled.  Overall seem to be mostly manageable though she is interested in seeking further treatment options at this point.  We discussed medications versus therapy.  She would like to avoid any new medications at this point.  She is taking trazodone 25 to 50 mg daily at sleep.  She is agreeable to see a therapist.  Will place referral today.

## 2022-09-12 NOTE — Progress Notes (Signed)
Please inform patient of the following:  Her potassium is elevated.  This is probably a lab error but recommend we recheck.  Please place future order for be met.  She can have this done at our office or we can send her to the [facility].  Her vitamin D is little low.  Recommend starting 1000 IUs daily.  We should recheck this in 3 to 6 months.  Her B12 is also on the lower range of normal.  Recommend starting oral supplementation 1000 mcg daily.  We can recheck in 3 to 6 months.  Everything else is normal and we can recheck in year.

## 2022-09-13 ENCOUNTER — Other Ambulatory Visit: Payer: Self-pay | Admitting: *Deleted

## 2022-09-13 DIAGNOSIS — E875 Hyperkalemia: Secondary | ICD-10-CM

## 2022-09-18 HISTORY — PX: OTHER SURGICAL HISTORY: SHX169

## 2022-09-25 NOTE — Progress Notes (Unsigned)
    Kelsey [name] D.[place] Phone: [phone]   Assessment and Plan:     There are no diagnoses linked to this encounter.  ***   Pertinent previous records reviewed include ***   Follow Up: ***     Subjective:   I, Kelsey [name], am serving as a Education administrator for Doctor Kelsey [name]   Chief Complaint: neck and shoulder pain   HPI:   09/26/2022 Patient is a 47 year old female complaining of neck and shoulder pain. Patient states  Relevant Historical Information: ***  Additional pertinent review of systems negative.   Current Outpatient Medications:    cyclobenzaprine (FLEXERIL) 10 MG tablet, Take 1 tablet (10 mg total) by mouth 3 (three) times daily as needed for muscle spasms., Disp: 30 tablet, Rfl: 0   diclofenac (VOLTAREN) 75 MG EC tablet, Take 1 tablet (75 mg total) by mouth 2 (two) times daily., Disp: 30 tablet, Rfl: 0   Mag Oxide-Vit D3-Turmeric (MAGNESIUM-VITAMIN D3-TURMERIC) [phone] MG-UNIT-MG TABS, Take by mouth., Disp: , Rfl:    Multiple Vitamin (MULTIVITAMIN) capsule, Take 1 capsule by mouth daily., Disp: , Rfl:    Tirzepatide (MOUNJARO [place]), Inject into the skin., Disp: , Rfl:    traZODone (DESYREL) 50 MG tablet, TAKE 0.5-1 TABLETS BY MOUTH AT BEDTIME AS NEEDED FOR SLEEP., Disp: 90 tablet, Rfl: 2   Objective:     There were no vitals filed for this visit.    There is no height or weight on file to calculate BMI.    Physical Exam:    ***   Electronically signed by:  Kelsey [name] D.[facility] 12:07 PM 09/25/22

## 2022-09-26 ENCOUNTER — Ambulatory Visit: Payer: BC Managed Care – PPO | Admitting: Sports Medicine

## 2022-09-26 ENCOUNTER — Ambulatory Visit (INDEPENDENT_AMBULATORY_CARE_PROVIDER_SITE_OTHER): Payer: BC Managed Care – PPO

## 2022-09-26 VITALS — BP 132/82 | HR 68 | Ht 62.0 in | Wt 160.0 lb

## 2022-09-26 DIAGNOSIS — M5030 Other cervical disc degeneration, unspecified cervical region: Secondary | ICD-10-CM | POA: Diagnosis not present

## 2022-09-26 DIAGNOSIS — M542 Cervicalgia: Secondary | ICD-10-CM

## 2022-09-26 MED ORDER — MELOXICAM 15 MG PO TABS: 15.0000 mg | ORAL_TABLET | Freq: Every day | ORAL | 0 refills | Status: DC

## 2022-09-26 NOTE — Patient Instructions (Addendum)
Good to see you  - Start meloxicam 15 mg daily x2 weeks.  If still having pain after 2 weeks, complete 3rd-week of meloxicam. May use remaining meloxicam as needed once daily for pain control.  Do not to use additional NSAIDs while taking meloxicam.  May use Tylenol 500-1000 mg 2 to 3 times a day for breakthrough pain. Neck HEP 3-4 week follow up  

## 2022-10-16 NOTE — Progress Notes (Unsigned)
Kelsey [name] D.[place] Phone: [phone]   Assessment and Plan:     There are no diagnoses linked to this encounter.  ***   Pertinent previous records reviewed include ***   Follow Up: ***     Subjective:   I, Kelsey [name], am serving as a Education administrator for Doctor Glennon Mac     Chief Complaint: neck and shoulder pain    HPI:    09/26/2022 Patient is a 46 year old female complaining of neck and shoulder pain. Patient states has been going on for months , she has flares out of no where, neck and shoulders tighten and dont let go, thinks it might be stress related, sometimes she does have numbness and tingling down to her fingers, no meds for the pain , upper trap pain, at rest she has a little pain , decreased ROM due to pain notes she felt a crack yesterday that was painful and then it felt good   10/17/2022 Patient states    Relevant Historical Information: None pertinent  Additional pertinent review of systems negative.   Current Outpatient Medications:    cyclobenzaprine (FLEXERIL) 10 MG tablet, Take 1 tablet (10 mg total) by mouth 3 (three) times daily as needed for muscle spasms., Disp: 30 tablet, Rfl: 0   diclofenac (VOLTAREN) 75 MG EC tablet, Take 1 tablet (75 mg total) by mouth 2 (two) times daily., Disp: 30 tablet, Rfl: 0   Mag Oxide-Vit D3-Turmeric (MAGNESIUM-VITAMIN D3-TURMERIC) [phone] MG-UNIT-MG TABS, Take by mouth., Disp: , Rfl:    meloxicam (MOBIC) 15 MG tablet, Take 1 tablet (15 mg total) by mouth daily., Disp: 30 tablet, Rfl: 0   Multiple Vitamin (MULTIVITAMIN) capsule, Take 1 capsule by mouth daily., Disp: , Rfl:    Tirzepatide (MOUNJARO [place]), Inject into the skin., Disp: , Rfl:    traZODone (DESYREL) 50 MG tablet, TAKE 0.5-1 TABLETS BY MOUTH AT BEDTIME AS NEEDED FOR SLEEP., Disp: 90 tablet, Rfl: 2   Objective:     There were no vitals filed for this visit.    There  is no height or weight on file to calculate BMI.    Physical Exam:    ***   Electronically signed by:  Kelsey [name] D.[facility] 11:51 AM 10/16/22

## 2022-10-17 ENCOUNTER — Ambulatory Visit: Payer: BC Managed Care – PPO | Admitting: Sports Medicine

## 2022-10-17 VITALS — BP 110/80 | HR 79 | Ht 62.0 in | Wt 160.0 lb

## 2022-10-17 DIAGNOSIS — M542 Cervicalgia: Secondary | ICD-10-CM | POA: Diagnosis not present

## 2022-10-17 DIAGNOSIS — M9908 Segmental and somatic dysfunction of rib cage: Secondary | ICD-10-CM | POA: Diagnosis not present

## 2022-10-17 DIAGNOSIS — M9902 Segmental and somatic dysfunction of thoracic region: Secondary | ICD-10-CM | POA: Diagnosis not present

## 2022-10-17 DIAGNOSIS — M5030 Other cervical disc degeneration, unspecified cervical region: Secondary | ICD-10-CM | POA: Diagnosis not present

## 2022-10-17 DIAGNOSIS — M9901 Segmental and somatic dysfunction of cervical region: Secondary | ICD-10-CM | POA: Diagnosis not present

## 2022-10-17 NOTE — Patient Instructions (Addendum)
Good to see you  Continue and complete course of meloxicam PT referral  3 week follow up

## 2022-10-23 ENCOUNTER — Ambulatory Visit: Payer: BC Managed Care – PPO | Admitting: Physical Therapy

## 2022-10-23 ENCOUNTER — Encounter: Payer: Self-pay | Admitting: Physical Therapy

## 2022-10-23 DIAGNOSIS — M6281 Muscle weakness (generalized): Secondary | ICD-10-CM

## 2022-10-23 DIAGNOSIS — M542 Cervicalgia: Secondary | ICD-10-CM
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
## 2022-10-26 ENCOUNTER — Encounter: Payer: BC Managed Care – PPO | Admitting: Physical Therapy

## 2022-10-31 ENCOUNTER — Ambulatory Visit: Payer: BC Managed Care – PPO | Admitting: Physical Therapy

## 2022-10-31 ENCOUNTER — Encounter: Payer: Self-pay | Admitting: Physical Therapy

## 2022-10-31 DIAGNOSIS — M542 Cervicalgia: Secondary | ICD-10-CM | POA: Diagnosis not present

## 2022-10-31 DIAGNOSIS — M6281 Muscle weakness (generalized): Secondary | ICD-10-CM | POA: Diagnosis not present

## 2022-10-31 DIAGNOSIS — M5459 Other low back pain: Secondary | ICD-10-CM
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
## 2022-11-02 ENCOUNTER — Encounter: Payer: Self-pay | Admitting: Physical Therapy

## 2022-11-02 ENCOUNTER — Ambulatory Visit: Payer: BC Managed Care – PPO | Admitting: Physical Therapy

## 2022-11-02 DIAGNOSIS — M6281 Muscle weakness (generalized): Secondary | ICD-10-CM | POA: Diagnosis not present

## 2022-11-02 DIAGNOSIS — M5459 Other low back pain: Secondary | ICD-10-CM | POA: Diagnosis not present

## 2022-11-02 DIAGNOSIS — M542 Cervicalgia: Secondary | ICD-10-CM
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
## 2022-11-06 NOTE — Progress Notes (Unsigned)
    Kelsey [name] D.[place] Phone: [phone]   Assessment and Plan:     There are no diagnoses linked to this encounter.  ***   Pertinent previous records reviewed include ***   Follow Up: ***     Subjective:   I, Kelsey [name], am serving as a Education administrator for Doctor Glennon Mac     Chief Complaint: neck and shoulder pain    HPI:    09/26/2022 Patient is a 47 year old female complaining of neck and shoulder pain. Patient states has been going on for months , she has flares out of no where, neck and shoulders tighten and dont let go, thinks it might be stress related, sometimes she does have numbness and tingling down to her fingers, no meds for the pain , upper trap pain, at rest she has a little pain , decreased ROM due to pain notes she felt a crack yesterday that was painful and then it felt good    10/17/2022 Patient states that she is okay    11/07/2022 Patient states    Relevant Historical Information: None pertinent Additional pertinent review of systems negative.   Current Outpatient Medications:    cyclobenzaprine (FLEXERIL) 10 MG tablet, Take 1 tablet (10 mg total) by mouth 3 (three) times daily as needed for muscle spasms., Disp: 30 tablet, Rfl: 0   diclofenac (VOLTAREN) 75 MG EC tablet, Take 1 tablet (75 mg total) by mouth 2 (two) times daily., Disp: 30 tablet, Rfl: 0   Mag Oxide-Vit D3-Turmeric (MAGNESIUM-VITAMIN D3-TURMERIC) [phone] MG-UNIT-MG TABS, Take by mouth., Disp: , Rfl:    meloxicam (MOBIC) 15 MG tablet, Take 1 tablet (15 mg total) by mouth daily., Disp: 30 tablet, Rfl: 0   Multiple Vitamin (MULTIVITAMIN) capsule, Take 1 capsule by mouth daily., Disp: , Rfl:    Tirzepatide (MOUNJARO Luther), Inject into the skin., Disp: , Rfl:    traZODone (DESYREL) 50 MG tablet, TAKE 0.5-1 TABLETS BY MOUTH AT BEDTIME AS NEEDED FOR SLEEP., Disp: 90 tablet, Rfl: 2   Objective:     There  were no vitals filed for this visit.    There is no height or weight on file to calculate BMI.    Physical Exam:    ***   Electronically signed by:  Kelsey [name] D.[facility] 7:35 AM 11/06/22

## 2022-11-07 ENCOUNTER — Ambulatory Visit (INDEPENDENT_AMBULATORY_CARE_PROVIDER_SITE_OTHER): Payer: BC Managed Care – PPO

## 2022-11-07 ENCOUNTER — Ambulatory Visit: Payer: BC Managed Care – PPO | Admitting: Sports Medicine

## 2022-11-07 ENCOUNTER — Ambulatory Visit: Payer: BC Managed Care – PPO | Admitting: Physical Therapy

## 2022-11-07 ENCOUNTER — Encounter: Payer: Self-pay | Admitting: Physical Therapy

## 2022-11-07 VITALS — BP 120/80 | HR 60 | Ht 62.0 in | Wt 155.0 lb

## 2022-11-07 DIAGNOSIS — M6281 Muscle weakness (generalized): Secondary | ICD-10-CM | POA: Diagnosis not present

## 2022-11-07 DIAGNOSIS — M9902 Segmental and somatic dysfunction of thoracic region: Secondary | ICD-10-CM

## 2022-11-07 DIAGNOSIS — M9901 Segmental and somatic dysfunction of cervical region: Secondary | ICD-10-CM

## 2022-11-07 DIAGNOSIS — M9908 Segmental and somatic dysfunction of rib cage: Secondary | ICD-10-CM | POA: Diagnosis not present

## 2022-11-07 DIAGNOSIS — Z111 Encounter for screening for respiratory tuberculosis: Secondary | ICD-10-CM

## 2022-11-07 DIAGNOSIS — M5459 Other low back pain: Secondary | ICD-10-CM | POA: Diagnosis not present

## 2022-11-07 DIAGNOSIS — M5030 Other cervical disc degeneration, unspecified cervical region: Secondary | ICD-10-CM

## 2022-11-07 DIAGNOSIS — M542 Cervicalgia: Secondary | ICD-10-CM

## 2022-11-07 NOTE — Progress Notes (Signed)
Pt received PPD test on left arm, pt tolerated well and will return on Thursday to check.

## 2022-11-07 NOTE — Patient Instructions (Addendum)
Good to see you  Discontinue meloxicam and use remainder as needed Tylenol [phone] mg 2-3 times a day for pain relief  Continue PT and HEP  Call us if you would like to start a prednisone dos pak 4 week follow up
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
## 2022-11-09 ENCOUNTER — Encounter: Payer: Self-pay | Admitting: Physical Therapy

## 2022-11-09 ENCOUNTER — Ambulatory Visit: Payer: BC Managed Care – PPO | Admitting: Physical Therapy

## 2022-11-09 DIAGNOSIS — M6281 Muscle weakness (generalized): Secondary | ICD-10-CM

## 2022-11-09 DIAGNOSIS — M542 Cervicalgia: Secondary | ICD-10-CM | POA: Diagnosis not present

## 2022-11-09 DIAGNOSIS — M5459 Other low back pain: Secondary | ICD-10-CM
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
## 2022-11-13 DIAGNOSIS — Z1231 Encounter for screening mammogram for malignant neoplasm of breast: Secondary | ICD-10-CM | POA: Diagnosis not present

## 2022-11-13 DIAGNOSIS — Z6829 Body mass index (BMI) 29.0-29.9, adult: Secondary | ICD-10-CM | POA: Diagnosis not present

## 2022-11-13 DIAGNOSIS — N951 Menopausal and female climacteric states: Secondary | ICD-10-CM | POA: Diagnosis not present

## 2022-11-13 DIAGNOSIS — Z01419 Encounter for gynecological examination (general) (routine) without abnormal findings: Secondary | ICD-10-CM | POA: Diagnosis not present

## 2022-11-13 DIAGNOSIS — Z124 Encounter for screening for malignant neoplasm of cervix: Secondary | ICD-10-CM | POA: Diagnosis not present

## 2022-11-14 ENCOUNTER — Encounter: Payer: BC Managed Care – PPO | Admitting: Physical Therapy

## 2022-11-16 ENCOUNTER — Encounter: Payer: BC Managed Care – PPO | Admitting: Physical Therapy

## 2022-11-17 DIAGNOSIS — I2699 Other pulmonary embolism without acute cor pulmonale: Secondary | ICD-10-CM

## 2022-11-17 HISTORY — DX: Other pulmonary embolism without acute cor pulmonale: I26.99

## 2022-11-21 ENCOUNTER — Ambulatory Visit: Payer: BC Managed Care – PPO | Admitting: Physical Therapy

## 2022-11-21 ENCOUNTER — Encounter: Payer: Self-pay | Admitting: Physical Therapy

## 2022-11-21 DIAGNOSIS — M5459 Other low back pain: Secondary | ICD-10-CM | POA: Diagnosis not present

## 2022-11-21 DIAGNOSIS — M6281 Muscle weakness (generalized): Secondary | ICD-10-CM | POA: Diagnosis not present

## 2022-11-21 DIAGNOSIS — M542 Cervicalgia: Secondary | ICD-10-CM
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
## 2022-11-23 ENCOUNTER — Encounter: Payer: Self-pay | Admitting: Physical Therapy

## 2022-11-23 ENCOUNTER — Ambulatory Visit: Payer: BC Managed Care – PPO | Admitting: Physical Therapy

## 2022-11-23 DIAGNOSIS — M5459 Other low back pain: Secondary | ICD-10-CM

## 2022-11-23 DIAGNOSIS — M542 Cervicalgia: Secondary | ICD-10-CM

## 2022-11-23 DIAGNOSIS — M6281 Muscle weakness (generalized): Secondary | ICD-10-CM | POA: Diagnosis not present
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
## 2022-11-26 ENCOUNTER — Other Ambulatory Visit: Payer: Self-pay | Admitting: Sports Medicine

## 2022-11-28 ENCOUNTER — Ambulatory Visit (INDEPENDENT_AMBULATORY_CARE_PROVIDER_SITE_OTHER): Payer: BC Managed Care – PPO

## 2022-11-28 ENCOUNTER — Encounter: Payer: Self-pay | Admitting: Physical Therapy

## 2022-11-28 ENCOUNTER — Ambulatory Visit: Payer: BC Managed Care – PPO | Admitting: Physical Therapy

## 2022-11-28 DIAGNOSIS — M5459 Other low back pain: Secondary | ICD-10-CM | POA: Diagnosis not present

## 2022-11-28 DIAGNOSIS — M6281 Muscle weakness (generalized): Secondary | ICD-10-CM

## 2022-11-28 DIAGNOSIS — Z111 Encounter for screening for respiratory tuberculosis: Secondary | ICD-10-CM

## 2022-11-28 DIAGNOSIS — M542 Cervicalgia: Secondary | ICD-10-CM | POA: Diagnosis not present
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
## 2022-11-28 NOTE — Progress Notes (Unsigned)
Kelsey [name] 47 yr old female presents to office today for TB Skin test per employer. Administered PPD intradermal left forearm. Patient tolerated well. Aware to return on Thursday 11/30/22 for skin test reading.

## 2022-11-28 NOTE — Progress Notes (Signed)
Benito Mccreedy D.[place] Phone: [phone]   Assessment and Plan:     1. Neck pain 2. DDD (degenerative disc disease), cervical 3. Somatic dysfunction of cervical region 4. Somatic dysfunction of thoracic region 5. Somatic dysfunction of rib region 6. Somatic dysfunction of lumbar region 7. Somatic dysfunction of pelvic region  -Chronic with exacerbation, subsequent visit - Overall moderate improvement after completion of meloxicam course, physical therapy, OMT at previous office visits.  Patient still has intermittent flares of pain which will often migrate between neck, thoracic spine, lumbar spine.  No red flag symptoms at today's visit - Patient completed physical therapy and is transitioning to only HEP moving forwards - Patient has received significant relief with OMT in the past.  Elects for repeat OMT today.  Tolerated well per note below. - Decision today to treat with OMT was based on Physical Exam   After verbal consent patient was treated with HVLA (high velocity low amplitude), ME (muscle energy), FPR (flex positional release), ST (soft tissue), PC/PD (Pelvic Compression/ Pelvic Decompression) techniques in cervical, rib, thoracic, lumbar, and pelvic areas. Patient tolerated the procedure well with improvement in symptoms.  Patient educated on potential side effects of soreness and recommended to rest, hydrate, and use Tylenol as needed for pain control.   Pertinent previous records reviewed include physical therapy note 11/30/2022, physical therapy note 11/28/2022, physical therapy note 11/23/2022   Follow Up: 4 weeks for reevaluation.  Could consider repeat OMT   Subjective:   I, Moenique Parris, am serving as a Education administrator for Doctor Glennon Mac     Chief Complaint: neck and shoulder pain    HPI:    09/26/2022 Patient is a 47 year old female complaining of neck and shoulder pain. Patient states has been  going on for months , she has flares out of no where, neck and shoulders tighten and dont let go, thinks it might be stress related, sometimes she does have numbness and tingling down to her fingers, no meds for the pain , upper trap pain, at rest she has a little pain , decreased ROM due to pain notes she felt a crack yesterday that was painful and then it felt good    10/17/2022 Patient states that she is okay    11/07/2022 Patient states she is okay    12/05/2022 Patient states she is good ,better     Relevant Historical Information: None pertinent  Additional pertinent review of systems negative.  Current Outpatient Medications  Medication Sig Dispense Refill   cyclobenzaprine (FLEXERIL) 10 MG tablet Take 1 tablet (10 mg total) by mouth 3 (three) times daily as needed for muscle spasms. 30 tablet 0   diclofenac (VOLTAREN) 75 MG EC tablet Take 1 tablet (75 mg total) by mouth 2 (two) times daily. 30 tablet 0   Mag Oxide-Vit D3-Turmeric (MAGNESIUM-VITAMIN D3-TURMERIC) [id number] MG-UNIT-MG TABS Take by mouth.     meloxicam (MOBIC) 15 MG tablet Take 1 tablet (15 mg total) by mouth daily. 30 tablet 0   Multiple Vitamin (MULTIVITAMIN) capsule Take 1 capsule by mouth daily.     Tirzepatide [facility]) Inject into the skin.     traZODone (DESYREL) 50 MG tablet TAKE 0.5-1 TABLETS BY MOUTH AT BEDTIME AS NEEDED FOR SLEEP. 90 tablet 2   No current facility-administered medications for this visit.      Objective:     Vitals:   12/05/22 1125  Pulse: 72  SpO2: 98%  Weight: 152 lb (68.9 kg)  Height: 5\' 2"  (1.575 m)      Body mass index is 27.8 kg/m.    Physical Exam:     General: Well-appearing, cooperative, sitting comfortably in no acute distress.   OMT Physical Exam:  ASIS Compression Test: Positive Right Cervical: TTP paraspinal, C3 RRSL Rib: Right elevated first rib with TTP Thoracic: TTP paraspinal, T3-5 RRSL, T6-9 RLSR Lumbar: TTP paraspinal, L2 RRSR Pelvis: Right  anterior innominate  Electronically signed by:  Benito Mccreedy D.[facility] 11:45 AM 12/05/22

## 2022-11-30 ENCOUNTER — Ambulatory Visit: Payer: BC Managed Care – PPO | Admitting: Physical Therapy

## 2022-11-30 ENCOUNTER — Encounter: Payer: Self-pay | Admitting: Physical Therapy

## 2022-11-30 ENCOUNTER — Ambulatory Visit: Payer: BC Managed Care – PPO

## 2022-11-30 DIAGNOSIS — M5459 Other low back pain: Secondary | ICD-10-CM

## 2022-11-30 DIAGNOSIS — M6281 Muscle weakness (generalized): Secondary | ICD-10-CM | POA: Diagnosis not present

## 2022-11-30 DIAGNOSIS — M542 Cervicalgia: Secondary | ICD-10-CM | POA: Diagnosis not present

## 2022-11-30 LAB — TB SKIN TEST
Induration: 0 mm
TB Skin Test: NEGATIVE
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
## 2022-12-05 ENCOUNTER — Encounter: Payer: BC Managed Care – PPO | Admitting: Physical Therapy

## 2022-12-05 ENCOUNTER — Ambulatory Visit: Payer: BC Managed Care – PPO | Admitting: Sports Medicine

## 2022-12-05 VITALS — HR 72 | Ht 62.0 in | Wt 152.0 lb

## 2022-12-05 DIAGNOSIS — M5030 Other cervical disc degeneration, unspecified cervical region: Secondary | ICD-10-CM

## 2022-12-05 DIAGNOSIS — M9908 Segmental and somatic dysfunction of rib cage: Secondary | ICD-10-CM

## 2022-12-05 DIAGNOSIS — M542 Cervicalgia: Secondary | ICD-10-CM

## 2022-12-05 DIAGNOSIS — M9903 Segmental and somatic dysfunction of lumbar region: Secondary | ICD-10-CM | POA: Diagnosis not present

## 2022-12-05 DIAGNOSIS — M9902 Segmental and somatic dysfunction of thoracic region: Secondary | ICD-10-CM | POA: Diagnosis not present

## 2022-12-05 DIAGNOSIS — M9901 Segmental and somatic dysfunction of cervical region: Secondary | ICD-10-CM

## 2022-12-05 DIAGNOSIS — M9905 Segmental and somatic dysfunction of pelvic region: Secondary | ICD-10-CM

## 2022-12-05 NOTE — Patient Instructions (Signed)
Good to see you   

## 2022-12-07 ENCOUNTER — Encounter: Payer: BC Managed Care – PPO | Admitting: Physical Therapy

## 2022-12-12 ENCOUNTER — Encounter: Payer: BC Managed Care – PPO | Admitting: Physical Therapy

## 2022-12-14 ENCOUNTER — Encounter: Payer: BC Managed Care – PPO | Admitting: Physical Therapy

## 2022-12-19 ENCOUNTER — Other Ambulatory Visit: Payer: Self-pay | Admitting: Sports Medicine

## 2022-12-19 ENCOUNTER — Encounter: Payer: BC Managed Care – PPO | Admitting: Physical Therapy

## 2022-12-21 ENCOUNTER — Encounter: Payer: BC Managed Care – PPO | Admitting: Physical Therapy

## 2022-12-27 NOTE — Progress Notes (Unsigned)
   Aleen Sells D.[name] [address] [place] [address] Phone: [phone]   Assessment and Plan:     There are no diagnoses linked to this encounter.  *** - Patient has received significant relief with OMT in the past.  Elects for repeat OMT today.  Tolerated well per note below. - Decision today to treat with OMT was based on Physical Exam   After verbal consent patient was treated with HVLA (high velocity low amplitude), ME (muscle energy), FPR (flex positional release), ST (soft tissue), PC/PD (Pelvic Compression/ Pelvic Decompression) techniques in cervical, rib, thoracic, lumbar, and pelvic areas. Patient tolerated the procedure well with improvement in symptoms.  Patient educated on potential side effects of soreness and recommended to rest, hydrate, and use Tylenol as needed for pain control.   Pertinent previous records reviewed include ***   Follow Up: ***     Subjective:   I, Raelle Chambers, am serving as a Neurosurgeon for Doctor Richardean Sale   Chief Complaint: neck and shoulder pain    HPI:    09/26/2022 Patient is a 47 year old female complaining of neck and shoulder pain. Patient states has been going on for months , she has flares out of no where, neck and shoulders tighten and dont let go, thinks it might be stress related, sometimes she does have numbness and tingling down to her fingers, no meds for the pain , upper trap pain, at rest she has a little pain , decreased ROM due to pain notes she felt a crack yesterday that was painful and then it felt good    10/17/2022 Patient states that she is okay    11/07/2022 Patient states she is okay    12/05/2022 Patient states she is good ,better    12/28/2022 Patient states   Relevant Historical Information: None pertinent  Additional pertinent review of systems negative.  Current Outpatient Medications  Medication Sig Dispense Refill   cyclobenzaprine (FLEXERIL) 10 MG tablet Take 1  tablet (10 mg total) by mouth 3 (three) times daily as needed for muscle spasms. 30 tablet 0   diclofenac (VOLTAREN) 75 MG EC tablet Take 1 tablet (75 mg total) by mouth 2 (two) times daily. 30 tablet 0   Mag Oxide-Vit D3-Turmeric (MAGNESIUM-VITAMIN D3-TURMERIC) [id number] MG-UNIT-MG TABS Take by mouth.     meloxicam (MOBIC) 15 MG tablet Take 1 tablet (15 mg total) by mouth daily. 30 tablet 0   Multiple Vitamin (MULTIVITAMIN) capsule Take 1 capsule by mouth daily.     Tirzepatide [facility]) Inject into the skin.     traZODone (DESYREL) 50 MG tablet TAKE 0.5-1 TABLETS BY MOUTH AT BEDTIME AS NEEDED FOR SLEEP. 90 tablet 2   No current facility-administered medications for this visit.      Objective:     There were no vitals filed for this visit.    There is no height or weight on file to calculate BMI.    Physical Exam:     General: Well-appearing, cooperative, sitting comfortably in no acute distress.   OMT Physical Exam:  ASIS Compression Test: Positive Right Cervical: TTP paraspinal, *** Rib: Bilateral elevated first rib with TTP Thoracic: TTP paraspinal,*** Lumbar: TTP paraspinal,*** Pelvis: Right anterior innominate  Electronically signed by:  Aleen Sells D.[name] 7:33 AM 12/27/22

## 2022-12-28 ENCOUNTER — Ambulatory Visit: Payer: BC Managed Care – PPO | Admitting: Sports Medicine

## 2022-12-28 ENCOUNTER — Ambulatory Visit: Payer: BC Managed Care – PPO | Admitting: Family Medicine

## 2022-12-28 ENCOUNTER — Encounter: Payer: Self-pay | Admitting: Family Medicine

## 2022-12-28 ENCOUNTER — Encounter: Payer: Self-pay | Admitting: Sports Medicine

## 2022-12-28 VITALS — BP 113/70 | HR 80 | Temp 97.7°F | Ht 62.0 in | Wt 151.6 lb

## 2022-12-28 VITALS — BP 102/76 | HR 78 | Wt 153.4 lb

## 2022-12-28 DIAGNOSIS — M542 Cervicalgia: Secondary | ICD-10-CM

## 2022-12-28 DIAGNOSIS — M9905 Segmental and somatic dysfunction of pelvic region: Secondary | ICD-10-CM | POA: Diagnosis not present

## 2022-12-28 DIAGNOSIS — Z01818 Encounter for other preprocedural examination: Secondary | ICD-10-CM

## 2022-12-28 DIAGNOSIS — M545 Low back pain, unspecified: Secondary | ICD-10-CM | POA: Diagnosis not present

## 2022-12-28 DIAGNOSIS — M9901 Segmental and somatic dysfunction of cervical region: Secondary | ICD-10-CM | POA: Diagnosis not present

## 2022-12-28 DIAGNOSIS — N62 Hypertrophy of breast: Secondary | ICD-10-CM | POA: Diagnosis not present

## 2022-12-28 DIAGNOSIS — M9908 Segmental and somatic dysfunction of rib cage: Secondary | ICD-10-CM

## 2022-12-28 DIAGNOSIS — M9903 Segmental and somatic dysfunction of lumbar region: Secondary | ICD-10-CM | POA: Diagnosis not present

## 2022-12-28 DIAGNOSIS — M5030 Other cervical disc degeneration, unspecified cervical region: Secondary | ICD-10-CM

## 2022-12-28 DIAGNOSIS — M9902 Segmental and somatic dysfunction of thoracic region: Secondary | ICD-10-CM

## 2022-12-28 NOTE — Progress Notes (Signed)
   Kelsey [name] is a 47 y.o. female who presents today for an office visit.  Assessment/Plan:  New/Acute Problems: Encounter for surgical evaluation She is overall very low risk for cardiac complication from upcoming surgery.  She will be having breast reduction and abdominoplasty.  Her EKG today shows normal sinus rhythm without any ischemic changes.  Anticipate she will do well with surgery.  Do not need to do any further workup at this time.  Chronic Problems Addressed Today: Macromastia Has upcoming breast reduction surgery.  This could be contributing some to her neck and back pain as well and hopefully will have some pain relief after her surgery.   Neck pain Recently completed physical therapy and did well with this.  She has also been following with sports medicine.  She does have Flexeril and meloxicam to use as needed.  Hopefully she will have some improvement with pain with above breast reduction surgery.    Subjective:  HPI:  She is here for surgical clearance. She will be getting a breast lift and tummy tuck next week.  She had reached out to them about any sort of preoperative evaluation in the recommended that she come to her PCPs office to get an EKG performed.  This that this was not required however was recommended.  She did have labs a few months ago which were within normal ranges.  She does not have any chest pain or shortness of breath.  Has good functional status.  See A/p for status of chronic conditions.        Objective:  Physical Exam: BP 113/70   Pulse 80   Temp 97.7 F (36.5 C) (Temporal)   Ht 5\' 2"  (1.575 m)   Wt 151 lb 9.6 oz (68.8 kg)   SpO2 98%   BMI 27.73 kg/m   Gen: No acute distress, resting comfortably CV: Regular rate and rhythm with no murmurs appreciated Pulm: Normal work of breathing, clear to auscultation bilaterally with no crackles, wheezes, or rhonchi Neuro: Grossly normal, moves all extremities Psych: Normal affect and thought  content  EKG:       Baylor Cortez M. [name], MD 12/28/2022 2:58 PM

## 2022-12-28 NOTE — Assessment & Plan Note (Signed)
Has upcoming breast reduction surgery.  This could be contributing some to her neck and back pain as well and hopefully will have some pain relief after her surgery.

## 2022-12-28 NOTE — Patient Instructions (Addendum)
Thank you for coming in today.    

## 2022-12-28 NOTE — Patient Instructions (Signed)
It was very nice to see you today!  Your EKG today is NORMAL.   You will do great with your surgery.  Please let us know if we can be of any further assistance.  Take care, Dr [name]  PLEASE NOTE:  If you had any lab tests, please let us know if you have not heard back within a few days. You may see your results on mychart before we have a chance to review them but we will give you a call once they are reviewed by [place].   If we ordered any referrals today, please let us know if you have not heard from their office within the next week.   If you had any urgent prescriptions sent in today, please check with the pharmacy within an hour of our visit to make sure the prescription was transmitted appropriately.   Please try these tips to maintain a healthy lifestyle:  Eat at least 3 REAL meals and 1-2 snacks per day.  Aim for no more than 5 hours between eating.  If you eat breakfast, please do so within one hour of getting up.   Each meal should contain half fruits/vegetables, one quarter protein, and one quarter carbs (no bigger than a computer mouse)  Cut down on sweet beverages. This includes juice, soda, and sweet tea.   Drink at least 1 glass of water with each meal and aim for at least 8 glasses per day  Exercise at least 150 minutes every week.

## 2022-12-28 NOTE — Assessment & Plan Note (Signed)
Recently completed physical therapy and did well with this.  She has also been following with sports medicine.  She does have Flexeril and meloxicam to use as needed.  Hopefully she will have some improvement with pain with above breast reduction surgery.

## 2023-01-04 ENCOUNTER — Emergency Department (HOSPITAL_BASED_OUTPATIENT_CLINIC_OR_DEPARTMENT_OTHER): Payer: BC Managed Care – PPO

## 2023-01-04 ENCOUNTER — Other Ambulatory Visit: Payer: Self-pay

## 2023-01-04 ENCOUNTER — Encounter (HOSPITAL_BASED_OUTPATIENT_CLINIC_OR_DEPARTMENT_OTHER): Payer: Self-pay

## 2023-01-04 ENCOUNTER — Emergency Department (HOSPITAL_BASED_OUTPATIENT_CLINIC_OR_DEPARTMENT_OTHER)
Admission: EM | Admit: 2023-01-04 | Discharge: 2023-01-04 | Disposition: A | Discharge status: Home / Self Care | Payer: BC Managed Care – PPO | Attending: Emergency Medicine | Admitting: Emergency Medicine

## 2023-01-04 DIAGNOSIS — Z48817 Encounter for surgical aftercare following surgery on the skin and subcutaneous tissue: Secondary | ICD-10-CM | POA: Diagnosis not present

## 2023-01-04 DIAGNOSIS — J439 Emphysema, unspecified: Secondary | ICD-10-CM | POA: Diagnosis not present

## 2023-01-04 DIAGNOSIS — D509 Iron deficiency anemia, unspecified: Secondary | ICD-10-CM | POA: Diagnosis not present

## 2023-01-04 DIAGNOSIS — Z7901 Long term (current) use of anticoagulants: Secondary | ICD-10-CM | POA: Diagnosis not present

## 2023-01-04 DIAGNOSIS — R079 Chest pain, unspecified: Secondary | ICD-10-CM | POA: Diagnosis not present

## 2023-01-04 DIAGNOSIS — I2694 Multiple subsegmental pulmonary emboli without acute cor pulmonale: Secondary | ICD-10-CM | POA: Insufficient documentation

## 2023-01-04 DIAGNOSIS — I2699 Other pulmonary embolism without acute cor pulmonale: Secondary | ICD-10-CM | POA: Diagnosis not present

## 2023-01-04 DIAGNOSIS — R0789 Other chest pain: Secondary | ICD-10-CM | POA: Diagnosis not present

## 2023-01-04 DIAGNOSIS — D649 Anemia, unspecified: Secondary | ICD-10-CM

## 2023-01-04 DIAGNOSIS — J9811 Atelectasis: Secondary | ICD-10-CM | POA: Diagnosis not present

## 2023-01-04 DIAGNOSIS — Z9889 Other specified postprocedural states: Secondary | ICD-10-CM

## 2023-01-04 DIAGNOSIS — R600 Localized edema: Secondary | ICD-10-CM | POA: Diagnosis not present

## 2023-01-04 LAB — TROPONIN I (HIGH SENSITIVITY): Troponin I (High Sensitivity): 2 ng/L (ref <18)

## 2023-01-04 LAB — CBC WITH DIFFERENTIAL/PLATELET
Abs Immature Granulocytes: 0.02 10*3/uL (ref 0.00–0.07)
Basophils Absolute: 0 10*3/uL (ref 0.0–0.1)
Basophils Relative: 0 %
Eosinophils Absolute: 0.3 10*3/uL (ref 0.0–0.5)
Eosinophils Relative: 5 %
HCT: 23 % — ABNORMAL LOW (ref 36.0–46.0)
Hemoglobin: 7.6 g/dL — ABNORMAL LOW (ref 12.0–15.0)
Immature Granulocytes: 0 %
Lymphocytes Relative: 29 %
Lymphs Abs: 1.7 10*3/uL (ref 0.7–4.0)
MCH: 30.3 pg (ref 26.0–34.0)
MCHC: 33 g/dL (ref 30.0–36.0)
MCV: 91.6 fL (ref 80.0–100.0)
Monocytes Absolute: 0.3 10*3/uL (ref 0.1–1.0)
Monocytes Relative: 5 %
Neutro Abs: 3.7 10*3/uL (ref 1.7–7.7)
Neutrophils Relative %: 61 %
Platelets: 223 10*3/uL (ref 150–400)
RBC: 2.51 MIL/uL — ABNORMAL LOW (ref 3.87–5.11)
RDW: 14.7 % (ref 11.5–15.5)
WBC: 6 10*3/uL (ref 4.0–10.5)
nRBC: 0 % (ref 0.0–0.2)

## 2023-01-04 LAB — BASIC METABOLIC PANEL
Anion gap: 7 (ref 5–15)
BUN: 10 mg/dL (ref 6–20)
CO2: 25 mmol/L (ref 22–32)
Calcium: 8.9 mg/dL (ref 8.9–10.3)
Chloride: 105 mmol/L (ref 98–111)
Creatinine, Ser: 0.91 mg/dL (ref 0.44–1.00)
GFR, Estimated: >60 mL/min (ref >60)
Glucose, Bld: 91 mg/dL (ref 70–99)
Potassium: 4.3 mmol/L (ref 3.5–5.1)
Sodium: 137 mmol/L (ref 135–145)

## 2023-01-04 MED ORDER — IBUPROFEN 800 MG PO TABS
800.0000 mg | ORAL_TABLET | Freq: Once | ORAL | Status: AC
  Administered 2023-01-04: 800 mg via ORAL
  Filled 2023-01-04: qty 1

## 2023-01-04 MED ORDER — APIXABAN 2.5 MG PO TABS
10.0000 mg | ORAL_TABLET | Freq: Once | ORAL | Status: AC
  Administered 2023-01-04: 10 mg via ORAL
  Filled 2023-01-04: qty 4

## 2023-01-04 MED ORDER — FERROUS SULFATE 325 (65 FE) MG PO TABS: 325.0000 mg | ORAL_TABLET | Freq: Every day | ORAL | 0 refills | Status: DC

## 2023-01-04 MED ORDER — APIXABAN (ELIQUIS) VTE STARTER PACK (10MG AND 5MG): ORAL_TABLET | ORAL | 0 refills | Status: DC

## 2023-01-04 MED ORDER — IOHEXOL 350 MG/ML SOLN
100.0000 mL | Freq: Once | INTRAVENOUS | Status: AC | PRN
  Administered 2023-01-04: 75 mL via INTRAVENOUS

## 2023-01-04 NOTE — ED Provider Notes (Signed)
[facility] EMERGENCY DEPARTMENT AT [facility] Provider Note   CSN: [id number] Arrival date & time: 01/04/23  1644     History  Chief Complaint  Patient presents with   Leg Swelling    Kelsey [name] is a 47 y.o. female.  Pt is a 47 yo female with no significant pmhx.  Pt had breast reduction and abdominoplasty on 4/15 in [place].  She has had bilateral leg swelling for 2 days.  She noticed more left sided leg swelling and pain today.  She also had some cp and sob.  She denies f/c.  She does have significant bruising after surgery.  No active bleeding.       Home Medications Prior to Admission medications   Medication Sig Start Date End Date Taking? Authorizing Provider  APIXABAN Everlene Balls) VTE STARTER PACK (  AND ) Take as directed on package: start with two-5mg  tablets twice daily for 7 days. On day 8, switch to one-5mg  tablet twice daily. 01/04/23  Yes Jacalyn Lefevre, MD  ferrous sulfate 325 (65 FE) MG tablet Take 1 tablet (325 mg total) by mouth daily. 01/04/23  Yes Jacalyn Lefevre, MD  cyclobenzaprine (FLEXERIL) 10 MG tablet Take 1 tablet (10 mg total) by mouth 3 (three) times daily as needed for muscle spasms. 09/07/22   Ardith Dark, MD  diclofenac (VOLTAREN) 75 MG EC tablet Take 1 tablet (75 mg total) by mouth 2 (two) times daily. 09/07/22   Ardith Dark, MD  meloxicam (MOBIC) 15 MG tablet Take 1 tablet (15 mg total) by mouth daily. 09/26/22   Richardean Sale, DO  Tirzepatide [facility]) Inject into the skin.    [provider]  traZODone (DESYREL) 50 MG tablet TAKE 0.5-1 TABLETS BY MOUTH AT BEDTIME AS NEEDED FOR SLEEP. Patient not taking: Reported on 12/28/2022 12/14/21   Ardith Dark, MD      Allergies    Patient has no known allergies.    Review of Systems   Review of Systems  Respiratory:  Positive for shortness of breath.   Cardiovascular:  Positive for chest pain.  Musculoskeletal:        Leg pain and swelling  All other  systems reviewed and are negative.   Physical Exam Updated Vital Signs BP 105/71   Pulse (!) 102   Temp 98.1 F (36.7 C)   Resp 19   Ht  (1.575 m)   Wt 66.7 kg   SpO2 100%   BMI 26.89 kg/m  Physical Exam Vitals and nursing note reviewed.  Constitutional:      Appearance: Normal appearance.  HENT:     Head: Normocephalic and atraumatic.     Right Ear: External ear normal.     Left Ear: External ear normal.     Nose: Nose normal.     Mouth/Throat:     Mouth: Mucous membranes are moist.     Pharynx: Oropharynx is clear.  Eyes:     Extraocular Movements: Extraocular movements intact.     Conjunctiva/sclera: Conjunctivae normal.     Pupils: Pupils are equal, round, and reactive to light.  Cardiovascular:     Rate and Rhythm: Normal rate and regular rhythm.     Pulses: Normal pulses.     Heart sounds: Normal heart sounds.  Pulmonary:     Effort: Pulmonary effort is normal.     Breath sounds: Normal breath sounds.  Abdominal:     General: Abdomen is flat. Bowel sounds are normal.  Palpations: Abdomen is soft.  Musculoskeletal:        General: Swelling present.     Cervical back: Normal range of motion and neck supple.     Right lower leg: Edema present.     Left lower leg: Edema present.     Comments: LLE slightly more swollen than right  Skin:    General: Skin is warm.     Capillary Refill: Capillary refill takes less than 2 seconds.  Neurological:     General: No focal deficit present.     Mental Status: She is alert and oriented to person, place, and time.  Psychiatric:        Mood and Affect: Mood normal.        Behavior: Behavior normal.     ED Results / Procedures / Treatments   Labs (all labs ordered are listed, but only abnormal results are displayed) Labs Reviewed  CBC WITH DIFFERENTIAL/PLATELET - Abnormal; Notable for the following components:      Result Value   RBC 2.51 (*)    Hemoglobin 7.6 (*)    HCT 23.0 (*)    All other components  within normal limits  BASIC METABOLIC PANEL  TROPONIN I (HIGH SENSITIVITY)    EKG EKG Interpretation  Date/Time:  Thursday January 04 2023 18:54:02 EDT Ventricular Rate:  90 PR Interval:  141 QRS Duration: 82 QT Interval:  356 QTC Calculation: 436 R Axis:   68 Text Interpretation: Sinus rhythm Low voltage, precordial leads Borderline T abnormalities, anterior leads No old tracing to compare Confirmed by Jacalyn Lefevre [phone]) on 01/04/2023 8:17:18 PM  Radiology CT Angio Chest PE W and/or Wo Contrast  Result Date: 01/04/2023 CLINICAL DATA:  Tummy tuck Monday. Two days later had bilateral leg swelling that is increased with leg pain on the left starting today. Sent for evaluation of DVT. PE suspected. EXAM: CT ANGIOGRAPHY CHEST WITH CONTRAST TECHNIQUE: Multidetector CT imaging of the chest was performed using the standard protocol during bolus administration of intravenous contrast. Multiplanar CT image reconstructions and MIPs were obtained to evaluate the vascular anatomy. RADIATION DOSE REDUCTION: This exam was performed according to the departmental dose-optimization program which includes automated exposure control, adjustment of the mA and/or kV according to patient size and/or use of iterative reconstruction technique. CONTRAST:  75mL OMNIPAQUE IOHEXOL 350 MG/ML SOLN COMPARISON:  None Available. FINDINGS: Cardiovascular: Satisfactory opacification of the pulmonary arteries to the segmental level. There are filling defects in the segmental and subsegmental right upper lobe pulmonary arteries (5/91 and 5/99). No evidence of right heart strain. Normal heart size. No pericardial effusion. Mediastinum/Nodes: Unremarkable esophagus.  No thoracic adenopathy. Lungs/Pleura: Bibasilar atelectasis. No pleural effusion or pneumothorax. And/or airways are patent. Upper Abdomen: Cholelithiasis.  No acute abnormality. Musculoskeletal: Subcutaneous emphysema in the anterior and lateral chest wall with diffuse  subcutaneous edema throughout the lower chest and upper abdominal wall. Review of the MIP images confirms the above findings. IMPRESSION: 1. Acute pulmonary emboli in the segmental and subsegmental right upper lobe pulmonary arteries. No evidence of right heart strain. 2. Subcutaneous emphysema and edema throughout the lower chest and upper abdominal wall. Likely related to recent abdominoplasty however clinical correlation is recommended to exclude cellulitis. Critical Value/emergent results were called by telephone at the time of interpretation on 01/04/2023 at 8:18 pm to provider Daxx Tiggs , who verbally acknowledged these results. Electronically Signed   By: Minerva Fester M.D.   On: 01/04/2023 20:19   US Venous Img Lower Bilateral (DVT)  Result Date: 01/04/2023 CLINICAL DATA:  Lower extremity edema. EXAM: BILATERAL LOWER EXTREMITY VENOUS DOPPLER ULTRASOUND TECHNIQUE: Gray-scale sonography with compression, as well as color and duplex ultrasound, were performed to evaluate the deep venous system(s) from the level of the common femoral vein through the popliteal and proximal calf veins. COMPARISON:  None Available. FINDINGS: VENOUS Normal compressibility of the common femoral, superficial femoral, and popliteal veins, as well as the visualized calf veins. Visualized portions of profunda femoral vein and great saphenous vein unremarkable. No filling defects to suggest DVT on grayscale or color Doppler imaging. Doppler waveforms show normal direction of venous flow, normal respiratory plasticity and response to augmentation. OTHER None. Limitations: none IMPRESSION: No evidence of bilateral lower extremity DVT. Electronically Signed   By: Narda Rutherford M.D.   On: 01/04/2023 18:49    Procedures Procedures    Medications Ordered in ED Medications  iohexol (OMNIPAQUE) 350 MG/ML injection 100 mL (75 mLs Intravenous Contrast Given 01/04/23 2003)  ibuprofen (ADVIL) tablet 800 mg (800 mg Oral Given  01/04/23 2013)  apixaban (ELIQUIS) tablet 10 mg (10 mg Oral Given 01/04/23 2104)    ED Course/ Medical Decision Making/ A&P                             Medical Decision Making Amount and/or Complexity of Data Reviewed Labs: ordered. Radiology: ordered.  Risk OTC drugs. Prescription drug management.   This patient presents to the ED for concern of leg swelling, this involves an extensive number of treatment options, and is a complaint that carries with it a high risk of complications and morbidity.  The differential diagnosis includes DVT, PE   Co morbidities that complicate the patient evaluation  none   Additional history obtained:  Additional history obtained from epic chart review External records from outside source obtained and reviewed including family   Lab Tests:  I Ordered, and personally interpreted labs.  The pertinent results include:  cbc with hgb 7.6 (hgb 12.9 on 09/07/22), bmp nl, trop nl   Imaging Studies ordered:  I ordered imaging studies including ct chest, [place]  I independently visualized and interpreted imaging which showed  CT chest:  Acute pulmonary emboli in the segmental and subsegmental right  upper lobe pulmonary arteries. No evidence of right heart strain.  2. Subcutaneous emphysema and edema throughout the lower chest and  upper abdominal wall. Likely related to recent abdominoplasty  however clinical correlation is recommended to exclude cellulitis.  [place]: No evidence of bilateral lower extremity DVT.  I agree with the radiologist interpretation   Cardiac Monitoring:  The patient was maintained on a cardiac monitor.  I personally viewed and interpreted the cardiac monitored which showed an underlying rhythm of: st   Medicines ordered and prescription drug management:  I ordered medication including eliquis  for pe  Reevaluation of the patient after these medicines showed that the patient improved I have reviewed the patients home  medicines and have made adjustments as needed   Test Considered:  Ct/us   Critical Interventions:  ct  Problem List / ED Course:  Anemia:  likely due to recent surgery. Pt has significant bruising.  I did offer to transfer her to [facility] or [facility] for a blood transfusion.  Pt declines.  She said she'd try iron pills and high iron foods.  She knows to return if she changes her mind. PE:  pt is not hypoxic.  She has no heart  strain.  She has no active bleeding, so she's started on eliquis.  She knows to return if she has worsening sob.  She is to f/u with pcp.   Reevaluation:  After the interventions noted above, I reevaluated the patient and found that they have :improved   Social Determinants of Health:  Lives at home   Dispostion:  After consideration of the diagnostic results and the patients response to treatment, I feel that the patent would benefit from discharge with outpatient f/u.          Final Clinical Impression(s) / ED Diagnoses Final diagnoses:  Anemia, unspecified type  Multiple subsegmental pulmonary emboli without acute cor pulmonale  S/P abdominoplasty    Rx / DC Orders ED Discharge Orders          Ordered    ferrous sulfate 325 (65 FE) MG tablet  Daily        01/04/23 2150    APIXABAN (ELIQUIS) VTE STARTER PACK (10MG  AND 5MG )        01/04/23 2150              Jacalyn Lefevre, MD 01/04/23 2156

## 2023-01-04 NOTE — Progress Notes (Signed)
ANTICOAGULATION CONSULT NOTE - Initial Consult  Pharmacy Consult for Eliquis Indication: pulmonary embolus  No Known Allergies  Patient Measurements: Height:  (157.5 cm) Weight: 66.7 kg (147 lb) IBW/kg (Calculated) : 50.1   Vital Signs: Temp: 97.8 F (36.6 C) (04/18 1655) Temp Source: Temporal (04/18 1655) BP: 104/71 (04/18 2015) Pulse Rate: 103 (04/18 2015)  Labs: Recent Labs    01/04/23 1803  HGB 7.6*  HCT 23.0*  PLT 223  CREATININE 0.91    Estimated Creatinine Clearance: 69.9 mL/min (by C-G formula based on SCr of 0.91 mg/dL).   Medical History: Past Medical History:  Diagnosis Date   Hypothyroidism     Assessment: 47 YO female presenting with bilateral leg swelling and left leg pain. [place] of lower extremities is negative. CT chest is positive for PE. Patient is not on [facility] PTA. Pharmacy consulted to dose Eliquis.   Hgb 7.6--low but patient just had a procedure done on Monday. Plts 223--stable. No signs/symptoms of bleeding per MD.   Goal of Therapy:  Monitor platelets by anticoagulation protocol: Yes   Plan:  -Start Eliquis  BID x7 days, followed by Eliquis  BID thereafter -Monitor for s/sx of bleeding    Cherylin Mylar, PharmD PGY1 Pharmacy Resident 4/18/20248:55 PM

## 2023-01-04 NOTE — ED Triage Notes (Signed)
Patient here POV from Home.  Endorses having a "Tummy Tuck" Monday that went uncomplicated. 2 Days ago began to have Bilateral leg Swelling that has increased in size and then today the Patient began to have Left Leg Pain today. Sent for Evaluation of DVT.   No fevers.   NAD Noted during Triage. A&Ox4. GCS 15. BIB Wheelchair.

## 2023-01-04 NOTE — ED Notes (Signed)
US at bedside

## 2023-01-04 NOTE — ED Notes (Signed)
While ambulating HR 124 O2 100%.

## 2023-01-05 ENCOUNTER — Telehealth: Payer: Self-pay | Admitting: Family Medicine

## 2023-01-05 NOTE — Telephone Encounter (Signed)
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 She would like a call back as soon as possible. Please advise.

## 2023-01-05 NOTE — Telephone Encounter (Signed)
Please change patients primary number to [phone]. Patient was transferred to front desk to schedule an appt with someone on Monday if PCP has no availability

## 2023-01-05 NOTE — Telephone Encounter (Signed)
She is anemic because of her recent surgery. Her body will build her blood counts back up but it will take some time. As long as she is feeling ok and not having any ongoing bleeding then it is ok to wait.    We can recheck her blood counts when she comes in to the office.  She needs to go back to the ED ASAP if she has any worsening symptoms.  Kelsey [name]. [name], MD 01/05/2023 1:53 PM

## 2023-01-05 NOTE — Telephone Encounter (Signed)
I reviewed her ED visit. They have her on a blood thinner which is the appropriate treatment. If her symptoms are stable it is fine for her to wait until Monday but she needs to be seen if her symptoms are worsening.  Kelsey [name]. [name], MD 01/05/2023 11:45 AM

## 2023-01-05 NOTE — Telephone Encounter (Signed)
CMA routed conversation to front desk - Dr Lavone Neri message was transmitted to patient.  Patient states she was told she needed a blood transfusion and patient declined - patient was given Iron pills however with previous procedure its going to make things worse - patient was also advise to start infusions. Should she wait to be seen by a hematologist? Patient is ok waiting until Monday to see Dr [name].   Please call patient.

## 2023-01-09 ENCOUNTER — Encounter: Payer: Self-pay | Admitting: Family Medicine

## 2023-01-09 ENCOUNTER — Ambulatory Visit: Payer: BC Managed Care – PPO | Admitting: Family Medicine

## 2023-01-09 VITALS — BP 94/61 | HR 80 | Temp 98.0°F | Ht 62.0 in | Wt 169.0 lb

## 2023-01-09 DIAGNOSIS — I2699 Other pulmonary embolism without acute cor pulmonale: Secondary | ICD-10-CM | POA: Diagnosis not present

## 2023-01-09 DIAGNOSIS — R600 Localized edema: Secondary | ICD-10-CM | POA: Diagnosis not present

## 2023-01-09 DIAGNOSIS — K625 Hemorrhage of anus and rectum: Secondary | ICD-10-CM

## 2023-01-09 DIAGNOSIS — D509 Iron deficiency anemia, unspecified: Secondary | ICD-10-CM

## 2023-01-09 LAB — COMPREHENSIVE METABOLIC PANEL
ALT: 22 U/L (ref 0–35)
AST: 23 U/L (ref 0–37)
Albumin: 3.5 g/dL (ref 3.5–5.2)
Alkaline Phosphatase: 49 U/L (ref 39–117)
BUN: 11 mg/dL (ref 6–23)
CO2: 28 mEq/L (ref 19–32)
Calcium: 8.6 mg/dL (ref 8.4–10.5)
Chloride: 105 mEq/L (ref 96–112)
Creatinine, Ser: 0.68 mg/dL (ref 0.40–1.20)
GFR: 105.17 mL/min (ref >60.00)
Glucose, Bld: 85 mg/dL (ref 70–99)
Potassium: 4.3 mEq/L (ref 3.5–5.1)
Sodium: 138 mEq/L (ref 135–145)
Total Bilirubin: 0.8 mg/dL (ref 0.2–1.2)
Total Protein: 5.6 g/dL — ABNORMAL LOW (ref 6.0–8.3)

## 2023-01-09 LAB — CBC
HCT: 23.5 % — CL (ref 36.0–46.0)
Hemoglobin: 8 g/dL — CL (ref 12.0–15.0)
MCHC: 33.9 g/dL (ref 30.0–36.0)
MCV: 93.5 fl (ref 78.0–100.0)
Platelets: 404 10*3/uL — ABNORMAL HIGH (ref 150.0–400.0)
RBC: 2.51 Mil/uL — ABNORMAL LOW (ref 3.87–5.11)
RDW: 15.2 % (ref 11.5–15.5)
WBC: 5.8 10*3/uL (ref 4.0–10.5)

## 2023-01-09 MED ORDER — APIXABAN 5 MG PO TABS
5.0000 mg | ORAL_TABLET | Freq: Two times a day (BID) | ORAL | 1 refills | Status: DC
Start: 2023-01-09 — End: 2023-04-10

## 2023-01-09 NOTE — Assessment & Plan Note (Signed)
Patient with provoked PE secondary to her recent surgery.  Given that this is provoked we will need to complete 3 months of anticoagulation.  She is already on the starting pack of Eliquis and switching over to 5 mg twice daily dosing in a couple of days.  Will send in 2 extra month of prescription to complete her 89-month course.  She will follow-up with me in a few months for this.

## 2023-01-09 NOTE — Progress Notes (Signed)
Kelsey [name] is a 47 y.o. female who presents today for an office visit.  Assessment/Plan:  New/Acute Problems: Anemia Likely secondary to her recent surgery.  She does feel overall better than she has the last few days and has started iron supplementation though she does note that this has been pretty difficult on her stomach and she is potentially interested in iron infusion.  We will try to get this set up at the infusion center.  There is some concern for ongoing bleeding as below with her red blood per rectum.  Advised patient to stop all NSAID use as this could be contributing.  We will recheck her CBC today.  If downtrending would consider blood transfusion.   Blood in stool Recheck CBC today as above.  Likely has a lower GI bleed such as internal hemorrhoids or diverticulosis given her history of chronic constipation.  Due to her recently diagnosed PE we will need to continue with Eliquis for now however if her hgb is downtrending we will need to consider alternatives.  If Hgb is stable we will have her follow-up with GI ASAP as an outpatient.  Vital signs are normal today and clinically she appears well-do not need to have her go back to the emergency room at this point.  Will place urgent referral to GI today.  She is aware to stop all NSAIDs as above.  We discussed reasons to return to care and seek emergent care.  Leg Edema Likely multifactorial in setting of her anemia and recent surgery.  Recommended conservative measures which she has already been doing with compression stockings, leg elevation, and salt avoidance.  Hopefully this should improve over the next few weeks  Chronic Problems Addressed Today: Pulmonary embolism Patient with provoked PE secondary to her recent surgery.  Given that this is provoked we will need to complete 3 months of anticoagulation.  She is already on the starting pack of Eliquis and switching over to 5 mg twice daily dosing in a couple of days.   Will send in 2 extra month of prescription to complete her 75-month course.  She will follow-up with me in a few months for this.     Subjective:  HPI:  Patient here today for ED follow-up.  She went to the ED on 01/04/2023 with bilateral leg swelling and pain, chest pain, and shortness of breath.  This was 3 days status post abdominoplasty and breast reduction.  In the ED her workup was significant for acute PE and anemia with a hemoglobin of 7.6.  Hgb was previously 12.9 a few months ago.  They did discuss transfer for blood transfusion however she declined and was discharged home.  She was started on oral iron.  Over the last 2 days her symptoms have been slowly improving.  Shortness of breath is improving.  Chest pain seems to be improving.  She has been compliant with her Eliquis.  She is having some lower extremity swelling and has been discussing this with her surgeon.  She was told this was part of a normal postoperative experience.  She has been trying to avoid her narcotic and has been using ibuprofen for pain control.  She is also using gabapentin 300 mg 1-2 times daily which works well.  She is having some ongoing issues with constipation.  She has had a few episodes of blood mixed in her stool.  Red in color.  No rectal pain.  No abdominal pain.  She has been eating more  beet juice and thinks that this may be the cause of the coloration however she stopped this a few days ago and had a bowel movement again yesterday with blood mixed in.       Objective:  Physical Exam: BP 94/61   Pulse 80   Temp 98 F (36.7 C) (Temporal)   Ht  (1.575 m)   Wt 169 lb (76.7 kg)   SpO2 100%   BMI 30.91 kg/m   Wt Readings from Last 3 Encounters:  01/09/23 169 lb (76.7 kg)  01/04/23 147 lb (66.7 kg)  12/28/22 151 lb 9.6 oz (68.8 kg)    Gen: No acute distress, resting comfortably CV: Regular rate and rhythm with no murmurs appreciated Pulm: Normal work of breathing, clear to auscultation  bilaterally with no crackles, wheezes, or rhonchi GI: Surgical sites appear to be healing normally.  No signs of infection.  Extensive ecchymosis throughout abdominal wall.  Neuro: Grossly normal, moves all extremities Psych: Normal affect and thought content  Time Spent: 45 minutes of total time was spent on the date of the encounter performing the following actions: chart review prior to seeing the patient including recent ED visit, obtaining history, performing a medically necessary exam, counseling on the treatment plan, placing orders, and documenting in our EHR.        Katina Degree. [name], MD 01/09/2023 11:48 AM

## 2023-01-09 NOTE — Patient Instructions (Addendum)
It was very nice to see you today!  We will need to have you on blood thinner for 3 months.  I will refill your Eliquis today.  We will check your blood counts today.  We will try to get you set up for an iron infusion to help bring your blood counts back up.  We need to have you see the gastroenterologist for your GI bleeding. You may have an internal hemorrhoid but this needs to be checked. Please avoid taking any NSAIDs such as ibuprofen if possible.   Return in about 3 months (around 04/10/2023).   Take care, Dr [name]  PLEASE NOTE:  If you had any lab tests, please let us know if you have not heard back within a few days. You may see your results on mychart before we have a chance to review them but we will give you a call once they are reviewed by [place].   If we ordered any referrals today, please let us know if you have not heard from their office within the next week.   If you had any urgent prescriptions sent in today, please check with the pharmacy within an hour of our visit to make sure the prescription was transmitted appropriately.   Please try these tips to maintain a healthy lifestyle:  Eat at least 3 REAL meals and 1-2 snacks per day.  Aim for no more than 5 hours between eating.  If you eat breakfast, please do so within one hour of getting up.   Each meal should contain half fruits/vegetables, one quarter protein, and one quarter carbs (no bigger than a computer mouse)  Cut down on sweet beverages. This includes juice, soda, and sweet tea.   Drink at least 1 glass of water with each meal and aim for at least 8 glasses per day  Exercise at least 150 minutes every week.

## 2023-01-10 ENCOUNTER — Emergency Department (HOSPITAL_COMMUNITY): Payer: BC Managed Care – PPO

## 2023-01-10 ENCOUNTER — Other Ambulatory Visit: Payer: Self-pay

## 2023-01-10 ENCOUNTER — Encounter: Payer: Self-pay | Admitting: Family Medicine

## 2023-01-10 ENCOUNTER — Emergency Department (HOSPITAL_COMMUNITY)
Admission: EM | Admit: 2023-01-10 | Discharge: 2023-01-11 | Disposition: A | Discharge status: Home / Self Care | Payer: BC Managed Care – PPO | Attending: Student | Admitting: Student

## 2023-01-10 ENCOUNTER — Telehealth: Payer: Self-pay | Admitting: Pharmacy Technician

## 2023-01-10 ENCOUNTER — Encounter (HOSPITAL_COMMUNITY): Payer: Self-pay

## 2023-01-10 DIAGNOSIS — Z7901 Long term (current) use of anticoagulants: Secondary | ICD-10-CM | POA: Diagnosis not present

## 2023-01-10 DIAGNOSIS — D649 Anemia, unspecified: Secondary | ICD-10-CM | POA: Insufficient documentation

## 2023-01-10 DIAGNOSIS — D500 Iron deficiency anemia secondary to blood loss (chronic): Secondary | ICD-10-CM

## 2023-01-10 DIAGNOSIS — R55 Syncope and collapse: Secondary | ICD-10-CM | POA: Diagnosis not present

## 2023-01-10 DIAGNOSIS — I499 Cardiac arrhythmia, unspecified: Secondary | ICD-10-CM | POA: Diagnosis not present

## 2023-01-10 DIAGNOSIS — R0602 Shortness of breath: Secondary | ICD-10-CM | POA: Insufficient documentation

## 2023-01-10 DIAGNOSIS — I2699 Other pulmonary embolism without acute cor pulmonale: Secondary | ICD-10-CM | POA: Diagnosis not present

## 2023-01-10 DIAGNOSIS — R000 Tachycardia, unspecified: Secondary | ICD-10-CM | POA: Diagnosis not present

## 2023-01-10 DIAGNOSIS — J439 Emphysema, unspecified: Secondary | ICD-10-CM | POA: Diagnosis not present

## 2023-01-10 DIAGNOSIS — R42 Dizziness and giddiness: Secondary | ICD-10-CM | POA: Diagnosis not present

## 2023-01-10 DIAGNOSIS — Z1152 Encounter for screening for COVID-19: Secondary | ICD-10-CM | POA: Insufficient documentation

## 2023-01-10 DIAGNOSIS — R0781 Pleurodynia: Secondary | ICD-10-CM | POA: Diagnosis not present

## 2023-01-10 DIAGNOSIS — R0789 Other chest pain: Secondary | ICD-10-CM | POA: Diagnosis not present

## 2023-01-10 DIAGNOSIS — D509 Iron deficiency anemia, unspecified: Secondary | ICD-10-CM | POA: Diagnosis not present

## 2023-01-10 DIAGNOSIS — J9811 Atelectasis: Secondary | ICD-10-CM | POA: Diagnosis not present

## 2023-01-10 LAB — CBC
HCT: 21.6 % — ABNORMAL LOW (ref 36.0–46.0)
Hemoglobin: 7.2 g/dL — ABNORMAL LOW (ref 12.0–15.0)
MCH: 32 pg (ref 26.0–34.0)
MCHC: 33.3 g/dL (ref 30.0–36.0)
MCV: 96 fL (ref 80.0–100.0)
Platelets: 396 10*3/uL (ref 150–400)
RBC: 2.25 MIL/uL — ABNORMAL LOW (ref 3.87–5.11)
RDW: 17 % — ABNORMAL HIGH (ref 11.5–15.5)
WBC: 5.6 10*3/uL (ref 4.0–10.5)
nRBC: 0 % (ref 0.0–0.2)

## 2023-01-10 LAB — URINALYSIS, ROUTINE W REFLEX MICROSCOPIC
Bilirubin Urine: NEGATIVE
Glucose, UA: NEGATIVE mg/dL
Hgb urine dipstick: NEGATIVE
Ketones, ur: NEGATIVE mg/dL
Leukocytes,Ua: NEGATIVE
Nitrite: NEGATIVE
Protein, ur: NEGATIVE mg/dL
Specific Gravity, Urine: 1.003 — ABNORMAL LOW (ref 1.005–1.030)
pH: 9 — ABNORMAL HIGH (ref 5.0–8.0)

## 2023-01-10 LAB — COMPREHENSIVE METABOLIC PANEL
ALT: 34 U/L (ref 0–44)
AST: 38 U/L (ref 15–41)
Albumin: 2.9 g/dL — ABNORMAL LOW (ref 3.5–5.0)
Alkaline Phosphatase: 56 U/L (ref 38–126)
Anion gap: 7 (ref 5–15)
BUN: 12 mg/dL (ref 6–20)
CO2: 22 mmol/L (ref 22–32)
Calcium: 7.9 mg/dL — ABNORMAL LOW (ref 8.9–10.3)
Chloride: 111 mmol/L (ref 98–111)
Creatinine, Ser: 0.66 mg/dL (ref 0.44–1.00)
GFR, Estimated: >60 mL/min (ref >60)
Glucose, Bld: 102 mg/dL — ABNORMAL HIGH (ref 70–99)
Potassium: 3.3 mmol/L — ABNORMAL LOW (ref 3.5–5.1)
Sodium: 140 mmol/L (ref 135–145)
Total Bilirubin: 0.7 mg/dL (ref 0.3–1.2)
Total Protein: 5.6 g/dL — ABNORMAL LOW (ref 6.5–8.1)

## 2023-01-10 LAB — POC OCCULT BLOOD, ED: Fecal Occult Bld: NEGATIVE

## 2023-01-10 MED ORDER — SODIUM CHLORIDE 0.9% IV SOLUTION: Freq: Once | INTRAVENOUS | Status: DC

## 2023-01-10 MED ORDER — IOHEXOL 350 MG/ML SOLN
80.0000 mL | Freq: Once | INTRAVENOUS | Status: AC | PRN
  Administered 2023-01-10: 80 mL via INTRAVENOUS

## 2023-01-10 NOTE — Telephone Encounter (Signed)
Hasna,  Feraheme is non preferred with BCBS and will be denied if patient has not tried and or failed preferred medication. Perferred medication is ferrlecit. Would you like to try ferrlecit?  Auth Submission: NO AUTH NEEDED Site of care: Site of care: [facility] Payer: [employer] Medication & CPT/J Code(s) submitted: Ferrlecit (Ferric Gluconate) [id number] Route of submission (phone, fax, portal):  Phone # Fax # Auth type: Buy/Bill Units/visits requested: 2 Reference number:  Approval from: 01/10/23 to 05/12/23

## 2023-01-10 NOTE — Progress Notes (Signed)
Blood counts are still low but are slightly increasing.  She can continue her current treatment plan.  Please make sure that we have her set up for the iron infusion as we discussed at her office visit.  She should let us know if she has any signs of ongoing bleeding.

## 2023-01-10 NOTE — Addendum Note (Signed)
Addended byZoe Lan on: 01/10/2023 12:04 PM   Modules accepted: Orders

## 2023-01-10 NOTE — ED Triage Notes (Addendum)
Arrived via EMS from home chest press, SOB, dizziness and light headed. Pt had procedure on 04/15 breast reduction, lipo, tummy tuck, swelling primarily on the left, DVT in left leg and PE. Currently on eliquis.

## 2023-01-11 LAB — RESP PANEL BY RT-PCR (RSV, FLU A&B, COVID)  RVPGX2
Influenza A by PCR: NEGATIVE
Influenza B by PCR: NEGATIVE
Resp Syncytial Virus by PCR: NEGATIVE
SARS Coronavirus 2 by RT PCR: NEGATIVE

## 2023-01-11 LAB — TROPONIN I (HIGH SENSITIVITY): Troponin I (High Sensitivity): <2 ng/L (ref <18)

## 2023-01-11 LAB — PREGNANCY, URINE: Preg Test, Ur: NEGATIVE

## 2023-01-11 LAB — PREPARE RBC (CROSSMATCH)

## 2023-01-11 LAB — TYPE AND SCREEN: Unit division: 0

## 2023-01-11 MED ORDER — PROCHLORPERAZINE EDISYLATE 10 MG/2ML IJ SOLN
10.0000 mg | Freq: Once | INTRAMUSCULAR | Status: AC
  Administered 2023-01-11: 10 mg via INTRAVENOUS
  Filled 2023-01-11: qty 2

## 2023-01-11 NOTE — ED Provider Notes (Signed)
[place] EMERGENCY DEPARTMENT AT [facility] Provider Note   CSN: [id number] Arrival date & time: 01/10/23  2053     History  Chief Complaint  Patient presents with   Shortness of Breath    Kelsey [name] is a 47 y.o. female.  HPI   Patient with medical history including hypothyroidism, presenting with complaints of chest pain, shortness of breath and dizziness.  Started suddenly earlier today, states that she was sitting down  and felt chest pressure felt lightheaded and felt short of breath.  States that she was diagnosed with a PE and has been on Eliquis  has not missed any dosages,  states that this episode lasted about 30 minutes and now she started to feel better, but she states that overall her symptoms have not improved since being diagnosed with a PE.  She states that she has slight sputum production denies any fevers or chills, no associated stomach pains nausea vomiting diarrhea, she states that she has noticed her stool may have some blood in it, she states he has history of hemorrhoids, no history of GI bleeds.  She also notes that she still feels some left leg tightness as if she has a charley horse.  States that her DVT study was negative  Reviewed patient's chart had breast reduction, tummy tuck in [place] on the 15th, was seen in the ED on the 18th was diagnosed with a right sided PE, without heart strain started on Eliquis, was seen by primary care doctor yesterday lab work was obtained CBC is slightly trending upwards.  Patient was going to follow-up with iron infusion clinic.    Home Medications Prior to Admission medications   Medication Sig Start Date End Date Taking? Authorizing Provider  APIXABAN Everlene Balls) VTE STARTER PACK (  AND ) Take as directed on package: start with two-5mg  tablets twice daily for 7 days. On day 8, switch to one-5mg  tablet twice daily. 01/04/23  Yes Jacalyn Lefevre, MD  escitalopram (LEXAPRO) 10 MG tablet Take 10 mg by  mouth daily.   Yes [provider]  gabapentin (NEURONTIN) 300 MG capsule Take 300 mg by mouth 3 (three) times daily as needed (pain). 01/08/23  Yes [provider]  oxyCODONE (OXY IR/ROXICODONE) 5 MG immediate release tablet Take 5 mg by mouth every 6 (six) hours as needed for moderate pain or severe pain. 01/08/23  Yes [provider]  apixaban (ELIQUIS) 5 MG TABS tablet Take 1 tablet (5 mg total) by mouth 2 (two) times daily. 01/09/23   Ardith Dark, MD  diclofenac (VOLTAREN) 75 MG EC tablet Take 1 tablet (75 mg total) by mouth 2 (two) times daily. Patient not taking: Reported on 01/09/2023 09/07/22   Ardith Dark, MD  ferrous sulfate 325 (65 FE) MG tablet Take 1 tablet (325 mg total) by mouth daily. 01/04/23   Jacalyn Lefevre, MD  meloxicam (MOBIC) 15 MG tablet Take 1 tablet (15 mg total) by mouth daily. Patient not taking: Reported on 01/11/2023 09/26/22   Richardean Sale, DO  traZODone (DESYREL) 50 MG tablet TAKE 0.5-1 TABLETS BY MOUTH AT BEDTIME AS NEEDED FOR SLEEP. Patient not taking: Reported on 01/09/2023 12/14/21   Ardith Dark, MD      Allergies    Patient has no known allergies.    Review of Systems   Review of Systems  Constitutional:  Negative for chills and fever.  Respiratory:  Positive for chest tightness and shortness of breath.   Cardiovascular:  Negative  for chest pain.  Gastrointestinal:  Negative for abdominal pain.  Neurological:  Positive for light-headedness. Negative for headaches.    Physical Exam Updated Vital Signs BP 90/60   Pulse 79   Temp 98.4 F (36.9 C) (Oral)   Resp 17   Ht  (1.575 m)   Wt 76.7 kg   SpO2 100%   BMI 30.91 kg/m  Physical Exam Vitals and nursing note reviewed. Exam conducted with a chaperone present.  Constitutional:      General: She is not in acute distress.    Appearance: She is not ill-appearing.  HENT:     Head: Normocephalic and atraumatic.     Nose: No congestion.  Eyes:      Conjunctiva/sclera: Conjunctivae normal.  Cardiovascular:     Rate and Rhythm: Regular rhythm. Tachycardia present.     Pulses: Normal pulses.     Heart sounds: No murmur heard.    No friction rub. No gallop.  Pulmonary:     Effort: No respiratory distress.     Breath sounds: No wheezing, rhonchi or rales.  Abdominal:     Palpations: Abdomen is soft.     Tenderness: There is no abdominal tenderness. There is no right CVA tenderness or left CVA tenderness.     Comments: Patient has surgical scars present, intact, no evidence of infection present, abdomen soft nontender.  Genitourinary:    Comments: With chaperone present rectal exam was performed, no noted external hemorrhoids, no rectal discharge or bleeding present, digital rectal exam was performed, no large stool burden present, no palpable masses, no frank melena or hematochezia, Hemoccult was negative. Musculoskeletal:     Comments: No unilateral leg swelling no calf tenderness no palpable cords.  Skin:    General: Skin is warm and dry.     Comments: Ecchymosis noted on the left posterior upper thigh, slight ecchymosis noted around the abdomen.  Neurological:     Mental Status: She is alert.  Psychiatric:        Mood and Affect: Mood normal.     ED Results / Procedures / Treatments   Labs (all labs ordered are listed, but only abnormal results are displayed) Labs Reviewed  COMPREHENSIVE METABOLIC PANEL - Abnormal; Notable for the following components:      Result Value   Potassium 3.3 (*)    Glucose, Bld 102 (*)    Calcium 7.9 (*)    Total Protein 5.6 (*)    Albumin 2.9 (*)    All other components within normal limits  CBC - Abnormal; Notable for the following components:   RBC 2.25 (*)    Hemoglobin 7.2 (*)    HCT 21.6 (*)    RDW 17.0 (*)    All other components within normal limits  URINALYSIS, ROUTINE W REFLEX MICROSCOPIC - Abnormal; Notable for the following components:   Color, Urine COLORLESS (*)    Specific  Gravity, Urine 1.003 (*)    pH 9.0 (*)    All other components within normal limits  RESP PANEL BY RT-PCR (RSV, FLU A&B, COVID)  RVPGX2  PREGNANCY, URINE  I-STAT BETA HCG BLOOD, ED ([facility], [facility], [facility] ONLY)  POC OCCULT BLOOD, ED  TYPE AND SCREEN  PREPARE RBC (CROSSMATCH)  TROPONIN I (HIGH SENSITIVITY)  TROPONIN I (HIGH SENSITIVITY)    EKG EKG Interpretation  Date/Time:  Wednesday January 10 2023 21:11:43 EDT Ventricular Rate:  84 PR Interval:  144 QRS Duration: 94 QT Interval:  380 QTC Calculation: 450 R  Axis:   79 Text Interpretation: Sinus rhythm Low voltage, precordial leads Baseline wander in lead(s) II III aVF V4 Confirmed by Kommor, Madison ([id number]) on 01/10/2023 11:07:54 PM  Radiology CT Angio Chest PE W and/or Wo Contrast  Result Date: 01/11/2023 CLINICAL DATA:  Pulmonary embolism (PE) suspected, high prob. Chest pressure, shortness of breath EXAM: CT ANGIOGRAPHY CHEST WITH CONTRAST TECHNIQUE: Multidetector CT imaging of the chest was performed using the standard protocol during bolus administration of intravenous contrast. Multiplanar CT image reconstructions and MIPs were obtained to evaluate the vascular anatomy. RADIATION DOSE REDUCTION: This exam was performed according to the departmental dose-optimization program which includes automated exposure control, adjustment of the mA and/or kV according to patient size and/or use of iterative reconstruction technique. CONTRAST:  80mL OMNIPAQUE IOHEXOL 350 MG/ML SOLN COMPARISON:  01/04/2023 FINDINGS: Cardiovascular: Right upper lobe pulmonary emboli again noted. Overall clot burden is decreased since prior study. No new pulmonary emboli. Heart is normal size. Aorta is normal caliber. Mediastinum/Nodes: No mediastinal, hilar, or axillary adenopathy. Trachea and esophagus are unremarkable. Thyroid unremarkable. Lungs/Pleura: Bilateral lower lobe areas of atelectasis, right greater than left. No effusions. Upper Abdomen: No acute findings.   Gallstones noted. Musculoskeletal: Right chest wall subcutaneous emphysema is decreased since prior study. No acute bony abnormality. Review of the MIP images confirms the above findings. IMPRESSION: Right upper lobe pulmonary emboli again noted, but clot burden has decreased since prior study. No new pulmonary emboli. Lower lobe atelectasis bilaterally, right greater than left. Cholelithiasis. Decreasing right chest wall subcutaneous emphysema. Electronically Signed   By: Charlett Nose M.D.   On: 01/11/2023 00:11   DG Chest Port 1 View  Result Date: 01/10/2023 CLINICAL DATA:  Shortness of breath. EXAM: PORTABLE CHEST 1 VIEW COMPARISON:  None Available. FINDINGS: The heart size and mediastinal contours are within normal limits. Low lung volumes are noted. Mild atelectasis is seen within the left lung base. There is no evidence of focal consolidation, pleural effusion or pneumothorax. The visualized skeletal structures are unremarkable. IMPRESSION: Low lung volumes with mild left basilar atelectasis. Electronically Signed   By: Aram Candela M.D.   On: 01/10/2023 21:26    Procedures .Critical Care  Performed by: Carroll Sage, PA-C Authorized by: Carroll Sage, PA-C   Critical care provider statement:    Critical care time (minutes):  30   Critical care time was exclusive of:  Separately billable procedures and treating other patients   Critical care was necessary to treat or prevent imminent or life-threatening deterioration of the following conditions:  Circulatory failure   Critical care was time spent personally by me on the following activities:  Development of treatment plan with patient or surrogate, discussions with consultants, evaluation of patient's response to treatment, examination of patient, ordering and review of laboratory studies, ordering and review of radiographic studies, ordering and performing treatments and interventions, pulse oximetry, re-evaluation of patient's  condition and review of old charts   I assumed direction of critical care for this patient from another provider in my specialty: no       Medications Ordered in ED Medications  0.9 %  sodium chloride infusion (Manually program via Guardrails IV Fluids) (has no administration in time range)  iohexol (OMNIPAQUE) 350 MG/ML injection 80 mL (80 mLs Intravenous Contrast Given 01/10/23 2335)  prochlorperazine (COMPAZINE) injection 10 mg (10 mg Intravenous Given 01/11/23 0434)    ED Course/ Medical Decision Making/ A&P  Medical Decision Making Amount and/or Complexity of Data Reviewed Labs: ordered. Radiology: ordered.  Risk Prescription drug management.   This patient presents to the ED for concern of chest pain, this involves an extensive number of treatment options, and is a complaint that carries with it a high risk of complications and morbidity.  The differential diagnosis includes worsening PE, dissection, ACS, pneumonia symptomatic anemia    Additional history obtained:  Additional history obtained from husband at bedside External records from outside source obtained and reviewed including recent ER note   Co morbidities that complicate the patient evaluation  PE currently on Eliquis  Social Determinants of Health:  N/A    Lab Tests:  I Ordered, and personally interpreted labs.  The pertinent results include: CBC shows normocytic anemia hemoglobin 7.2 decreased from 7.82 days ago, CMP reveals potassium 3.3, glucose 102, calcium 7.8, UA unremarkable, urine pregnancy negative   Imaging Studies ordered:  I ordered imaging studies including chest x-ray, CT of chest I independently visualized and interpreted imaging which showed chest x-ray negative, CTA negative for worsening PE I agree with the radiologist interpretation   Cardiac Monitoring:  The patient was maintained on a cardiac monitor.  I personally viewed and interpreted the  cardiac monitored which showed an underlying rhythm of: EKG without signs of ischemia   Medicines ordered and prescription drug management:  I ordered medication including N/A I have reviewed the patients home medicines and have made adjustments as needed  Critical Interventions:  Symptomatic anemia will provide with a unit of blood   Reevaluation:  Presenting with complaints of chest pain, states that she feels better from before, she was obtain basic lab workup and imaging which I personally viewed, shows worsening hemoglobin, will perform rectal exam to examine for rectal bleed, this was negative.  Patient states that she has not improved since being diagnosed with a PE, concern for possible worsening clot burden, will send for CTA of chest, will also provide with a unit of blood.  CT of chest shows decreased clot burden, suspect some of her symptoms are secondary to symptomatic anemia will provide with a blood transfusion continue to monitor  Reassessed the patient states she has a slight headache, no focal deficits, BP was slightly low, reviewed patient's chart she is typically in the low 100s to mid 90s, she is not endorsing any tongue throat lip swelling difficulty breathing nursing any pruritus.  Likely this is her baseline.  On my evaluation BP was 91/56.  Results the patient patient is resting comfortably, agreement discharge at this time.    Consultations Obtained:  N/A    Test Considered:  N/A    Rule out I have low suspicion for ACS as history is atypical, patient has no cardiac history, EKG  without signs of ischemia, patient had a negative troponin**.  Suspicion for worsening PE, dissection, AAA is low and CT imaging all negative for these findings.  Suspicion for postop infection is also low and she is afebrile, no leukocytosis, surgical scars do not reveal evidence of infection during my examination.  Suspicion for pneumonia is also low lung sounds are clear  bilaterally, chest x-ray as well as CT of chest were both negative for these findings.  I doubt GI bleed as her Hemoccult was negative, she is hemodynamically stable.  She does have a slight decrease in her hemoglobin, suspect this is from the bruising from her previous procedures.     Dispostion and problem list  After consideration  of the diagnostic results and the patients response to treatment, I feel that the patent would benefit from discharge.  Precordial chest pain-unclear etiology, by suspect multifactorial, possible anxiety versus symptomatic anemia versus postop pain we will have her follow-up with her PCP for further assessment Microcytic anemia-acute blood loss from surgery, patient given a unit of blood here in the emergency department, will have her follow-up with her primary doctor for further assessment.  Currently on iron supplements we will have her continue with this.            Final Clinical Impression(s) / ED Diagnoses Final diagnoses:  Normocytic anemia due to blood loss    Rx / DC Orders ED Discharge Orders     None         Carroll Sage, PA-C 01/11/23 1610    Glendora Score, MD 01/12/23 [phone]

## 2023-01-11 NOTE — ED Notes (Signed)
Patient verbalizes understanding of discharge instructions. Opportunity for questioning and answers were provided. Armband removed by staff, pt discharged from ED. Wheeled out to lobby, husband to drive her home

## 2023-01-11 NOTE — Discharge Instructions (Addendum)
Lab workup and imaging were all reassuring, given you a unit of blood, this should help with your anemia, please continue with the iron pills that you are taking.  Would like you follow-up with your primary doctor for reassessment.  Come back to the emergency department if you develop chest pain, shortness of breath, severe abdominal pain, uncontrolled nausea, vomiting, diarrhea.

## 2023-01-11 NOTE — Telephone Encounter (Signed)
Ok to use preferred medication.  Kelsey [name]. [name], MD 01/11/2023 12:45 PM

## 2023-01-12 ENCOUNTER — Encounter: Payer: Self-pay | Admitting: Family Medicine

## 2023-01-12 LAB — TYPE AND SCREEN
ABO/RH(D): B POS
Antibody Screen: NEGATIVE

## 2023-01-12 LAB — BPAM RBC
Blood Product Expiration Date: 202405082359
ISSUE DATE / TIME: 202404250319
Unit Type and Rh: 7300

## 2023-01-12 NOTE — Addendum Note (Signed)
Addended byZoe Lan on: 01/12/2023 07:49 AM   Modules accepted: Orders

## 2023-01-15 ENCOUNTER — Ambulatory Visit: Payer: BC Managed Care – PPO | Admitting: Family Medicine

## 2023-01-15 ENCOUNTER — Encounter: Payer: Self-pay | Admitting: Family Medicine

## 2023-01-15 VITALS — BP 101/68 | HR 77 | Temp 97.8°F | Resp 16 | Ht 62.0 in | Wt 163.0 lb

## 2023-01-15 DIAGNOSIS — D649 Anemia, unspecified: Secondary | ICD-10-CM

## 2023-01-15 DIAGNOSIS — I2699 Other pulmonary embolism without acute cor pulmonale: Secondary | ICD-10-CM

## 2023-01-15 LAB — CBC
HCT: 28.9 % — ABNORMAL LOW (ref 36.0–46.0)
Hemoglobin: 9.9 g/dL — ABNORMAL LOW (ref 12.0–15.0)
MCHC: 34.3 g/dL (ref 30.0–36.0)
MCV: 94.5 fl (ref 78.0–100.0)
Platelets: 506 10*3/uL — ABNORMAL HIGH (ref 150.0–400.0)
RBC: 3.06 Mil/uL — ABNORMAL LOW (ref 3.87–5.11)
RDW: 16.2 % — ABNORMAL HIGH (ref 11.5–15.5)
WBC: 4.5 10*3/uL (ref 4.0–10.5)

## 2023-01-15 MED ORDER — TRAMADOL HCL 50 MG PO TABS: 50.0000 mg | ORAL_TABLET | Freq: Three times a day (TID) | ORAL | 0 refills | Status: AC | PRN

## 2023-01-15 MED ORDER — DOXYCYCLINE HYCLATE 100 MG PO TABS
100.0000 mg | ORAL_TABLET | Freq: Two times a day (BID) | ORAL | 0 refills | Status: DC
Start: 2023-01-15 — End: 2023-04-10

## 2023-01-15 NOTE — Patient Instructions (Addendum)
It was very nice to see you today!  We will check blood work today.  You can use the tramadol as needed for pain instead of the oxycodone.   Please start the doxycycline for your skin infection.  Let us know if not improving.  Return if symptoms worsen or fail to improve.   Take care, Dr [name]  PLEASE NOTE:  If you had any lab tests, please let us know if you have not heard back within a few days. You may see your results on mychart before we have a chance to review them but we will give you a call once they are reviewed by [place].   If we ordered any referrals today, please let us know if you have not heard from their office within the next week.   If you had any urgent prescriptions sent in today, please check with the pharmacy within an hour of our visit to make sure the prescription was transmitted appropriately.   Please try these tips to maintain a healthy lifestyle:  Eat at least 3 REAL meals and 1-2 snacks per day.  Aim for no more than 5 hours between eating.  If you eat breakfast, please do so within one hour of getting up.   Each meal should contain half fruits/vegetables, one quarter protein, and one quarter carbs (no bigger than a computer mouse)  Cut down on sweet beverages. This includes juice, soda, and sweet tea.   Drink at least 1 glass of water with each meal and aim for at least 8 glasses per day  Exercise at least 150 minutes every week.

## 2023-01-15 NOTE — Assessment & Plan Note (Signed)
Secondary to recent surgery.  Her FOBT was negative in the ED and she has not had any other signs of blood in her stool.  Will be rechecking CBC today as above and setting her up for iron infusions as above.

## 2023-01-15 NOTE — Progress Notes (Signed)
Good news!  Her blood counts are much better.  She should be hearing about iron infusion soon.  She should let us know if she has not heard anything by later this week.  She should let us know if her symptoms do not continue to improve or if she has any new symptoms.

## 2023-01-15 NOTE — Assessment & Plan Note (Signed)
Doing well on Eliquis.  Recent CT scan showed decreased overall clot burden.  She will complete 26-month course.  Need to avoid NSAIDs while on this.

## 2023-01-15 NOTE — Progress Notes (Signed)
   Kelsey [name] is a 47 y.o. female who presents today for an office visit.  Assessment/Plan:  New/Acute Problems: Anemia Doing well status post blood transfusion in the ED.  We are setting her up for iron infusions and hopefully we can get that set up later this week.  We will recheck CBC today.  She is trying to eat iron rich foods.  Post Operative Pain Improving day by day is still having some issues with ongoing pain especially later in the day.  Oxycodone is too sedating for her.  Will try tramadol.  Hopefully she will not need this much longer going forward.  Cellulitis  No red flags.  No signs of systemic infection.  Will start doxycycline.  She will let us know if not improving.  We discussed reasons to return to care.  Chronic Problems Addressed Today: Pulmonary embolism (HCC) Doing well on Eliquis.  Recent CT scan showed decreased overall clot burden.  She will complete 45-month course.  Need to avoid NSAIDs while on this.  Anemia Secondary to recent surgery.  Her FOBT was negative in the ED and she has not had any other signs of blood in her stool.  Will be rechecking CBC today as above and setting her up for iron infusions as above.     Subjective:  HPI:  See A/p for status of chronic conditions.  Patient is here today for follow-up.  We saw her a week agoFor ED follow-up for her recently diagnosed PE and anemia secondary to recent surgery.  CBC at that time was stable.  She did end up going to the ED today after our visit due to chest pressure and lightheadedness. Hgb dropped to 7.2 in the ED. She got 1 unit pRBC.   Over the last few days her shortness has improved significantly but she is still having a lot of fatigue. Pain is better controlled but still has significant pain later in the day. Oxycodone does help with this but she feels like it makes her too somnolent.   She has also noticed a rash near her incision.  This has been going on for the last week or so.   Started as a small red area but seems to be increasing in size.  More tender.  No fevers or chills.       Objective:  Physical Exam: BP 101/68   Pulse 77   Temp 97.8 F (36.6 C) (Temporal)   Resp 16   Ht 5\' 2"  (1.575 m)   Wt 163 lb (73.9 kg)   SpO2 100%   BMI 29.81 kg/m   Wt Readings from Last 3 Encounters:  01/15/23 163 lb (73.9 kg)  01/10/23 169 lb (76.7 kg)  01/09/23 169 lb (76.7 kg)  Gen: No acute distress, resting comfortably CV: Regular rate and rhythm with no murmurs appreciated Pulm: Normal work of breathing, clear to auscultation bilaterally with no crackles, wheezes, or rhonchi Skin: Surgical scars ability to be healing normally.  3 x 5 cm erythematous area on the left aspect of central wound.  Tender to palpation. Neuro: Grossly normal, moves all extremities Psych: Normal affect and thought content      Kelsey [name] M. [name], MD 01/15/2023 12:11 PM

## 2023-01-22 ENCOUNTER — Ambulatory Visit (INDEPENDENT_AMBULATORY_CARE_PROVIDER_SITE_OTHER): Payer: BC Managed Care – PPO

## 2023-01-22 VITALS — BP 101/66 | HR 75 | Temp 98.8°F | Resp 16 | Ht 62.0 in | Wt 159.0 lb

## 2023-01-22 DIAGNOSIS — D509 Iron deficiency anemia, unspecified: Secondary | ICD-10-CM

## 2023-01-22 MED ORDER — ACETAMINOPHEN 325 MG PO TABS
650.0000 mg | ORAL_TABLET | Freq: Once | ORAL | Status: AC
  Administered 2023-01-22: 650 mg via ORAL
  Filled 2023-01-22: qty 2

## 2023-01-22 MED ORDER — FAMOTIDINE IN NACL 20-0.9 MG/50ML-% IV SOLN: 20.0000 mg | Freq: Once | INTRAVENOUS | Status: DC | PRN

## 2023-01-22 MED ORDER — DIPHENHYDRAMINE HCL 50 MG/ML IJ SOLN: 50.0000 mg | Freq: Once | INTRAMUSCULAR | Status: DC | PRN

## 2023-01-22 MED ORDER — DIPHENHYDRAMINE HCL 25 MG PO CAPS: 25.0000 mg | ORAL_CAPSULE | Freq: Once | ORAL | Status: DC

## 2023-01-22 MED ORDER — SODIUM CHLORIDE 0.9 % IV SOLN
510.0000 mg | Freq: Once | INTRAVENOUS | Status: DC
  Filled 2023-01-22: qty 17

## 2023-01-22 MED ORDER — ALBUTEROL SULFATE HFA 108 (90 BASE) MCG/ACT IN AERS: 2.0000 | INHALATION_SPRAY | Freq: Once | RESPIRATORY_TRACT | Status: DC | PRN

## 2023-01-22 MED ORDER — DIPHENHYDRAMINE HCL 25 MG PO CAPS
25.0000 mg | ORAL_CAPSULE | Freq: Once | ORAL | Status: AC
  Administered 2023-01-22: 25 mg via ORAL
  Filled 2023-01-22: qty 1

## 2023-01-22 MED ORDER — ACETAMINOPHEN 325 MG PO TABS: 650.0000 mg | ORAL_TABLET | Freq: Once | ORAL | Status: DC

## 2023-01-22 MED ORDER — METHYLPREDNISOLONE SODIUM SUCC 125 MG IJ SOLR: 125.0000 mg | Freq: Once | INTRAMUSCULAR | Status: DC | PRN

## 2023-01-22 MED ORDER — SODIUM CHLORIDE 0.9 % IV SOLN
250.0000 mg | Freq: Once | INTRAVENOUS | Status: AC
  Administered 2023-01-22: 250 mg via INTRAVENOUS
  Filled 2023-01-22: qty 20

## 2023-01-22 MED ORDER — SODIUM CHLORIDE 0.9 % IV SOLN: Freq: Once | INTRAVENOUS | Status: DC | PRN

## 2023-01-22 MED ORDER — EPINEPHRINE 0.3 MG/0.3ML IJ SOAJ: 0.3000 mg | Freq: Once | INTRAMUSCULAR | Status: DC | PRN

## 2023-01-22 NOTE — Progress Notes (Signed)
Diagnosis: Iron Deficiency Anemia  Provider:  Chilton Greathouse MD  Procedure: IV Infusion  IV Type: Peripheral, IV Location: R Hand  Ferrlecit (ferric gluconate), Dose: 250 mg  Infusion Start Time: 1114  Infusion Stop Time: 1320  Post Infusion IV Care: Observation period completed and Peripheral IV Discontinued  Discharge: Condition: Good, Destination: Home . AVS Provided  Performed by:  Garnette Czech, RN

## 2023-01-24 ENCOUNTER — Encounter: Payer: Self-pay | Admitting: Family Medicine

## 2023-01-26 ENCOUNTER — Encounter: Payer: Self-pay | Admitting: Family Medicine

## 2023-01-26 NOTE — Telephone Encounter (Signed)
Please advise 

## 2023-01-26 NOTE — Telephone Encounter (Signed)
It should be ok for her to travel now.  It is ok for her to hold off on any further infusions.  Katina Degree. [name], MD 01/26/2023 1:04 PM

## 2023-01-30 ENCOUNTER — Emergency Department (HOSPITAL_COMMUNITY)
Admission: EM | Admit: 2023-01-30 | Discharge: 2023-01-30 | Disposition: A | Discharge status: Home / Self Care | Payer: BC Managed Care – PPO | Attending: Emergency Medicine | Admitting: Emergency Medicine

## 2023-01-30 ENCOUNTER — Encounter (HOSPITAL_COMMUNITY): Payer: Self-pay | Admitting: Emergency Medicine

## 2023-01-30 ENCOUNTER — Other Ambulatory Visit: Payer: Self-pay

## 2023-01-30 ENCOUNTER — Telehealth: Payer: Self-pay | Admitting: Pulmonary Disease

## 2023-01-30 ENCOUNTER — Ambulatory Visit (INDEPENDENT_AMBULATORY_CARE_PROVIDER_SITE_OTHER): Payer: BC Managed Care – PPO

## 2023-01-30 VITALS — BP 128/80 | HR 97 | Temp 98.1°F | Resp 18 | Ht 62.0 in | Wt 153.6 lb

## 2023-01-30 DIAGNOSIS — R0902 Hypoxemia: Secondary | ICD-10-CM | POA: Diagnosis not present

## 2023-01-30 DIAGNOSIS — T454X5A Adverse effect of iron and its compounds, initial encounter: Secondary | ICD-10-CM

## 2023-01-30 DIAGNOSIS — D509 Iron deficiency anemia, unspecified: Secondary | ICD-10-CM | POA: Diagnosis not present

## 2023-01-30 DIAGNOSIS — T7840XA Allergy, unspecified, initial encounter: Secondary | ICD-10-CM | POA: Insufficient documentation

## 2023-01-30 DIAGNOSIS — E039 Hypothyroidism, unspecified: Secondary | ICD-10-CM | POA: Insufficient documentation

## 2023-01-30 DIAGNOSIS — R202 Paresthesia of skin: Secondary | ICD-10-CM | POA: Diagnosis not present

## 2023-01-30 DIAGNOSIS — R609 Edema, unspecified: Secondary | ICD-10-CM | POA: Diagnosis not present

## 2023-01-30 DIAGNOSIS — Z7901 Long term (current) use of anticoagulants: Secondary | ICD-10-CM | POA: Insufficient documentation

## 2023-01-30 MED ORDER — EPINEPHRINE 0.3 MG/0.3ML IJ SOAJ: 0.3000 mg | INTRAMUSCULAR | 0 refills | Status: AC | PRN

## 2023-01-30 MED ORDER — FAMOTIDINE IN NACL 20-0.9 MG/50ML-% IV SOLN
20.0000 mg | Freq: Once | INTRAVENOUS | Status: AC | PRN
  Administered 2023-01-30: 20 mg via INTRAVENOUS

## 2023-01-30 MED ORDER — DIPHENHYDRAMINE HCL 25 MG PO TABS: 25.0000 mg | ORAL_TABLET | Freq: Four times a day (QID) | ORAL | 0 refills | Status: DC

## 2023-01-30 MED ORDER — ALBUTEROL SULFATE HFA 108 (90 BASE) MCG/ACT IN AERS: 2.0000 | INHALATION_SPRAY | Freq: Once | RESPIRATORY_TRACT | Status: DC | PRN

## 2023-01-30 MED ORDER — ACETAMINOPHEN 325 MG PO TABS
650.0000 mg | ORAL_TABLET | Freq: Four times a day (QID) | ORAL | Status: DC | PRN
  Administered 2023-01-30: 650 mg via ORAL
  Filled 2023-01-30: qty 2

## 2023-01-30 MED ORDER — METHYLPREDNISOLONE SODIUM SUCC 125 MG IJ SOLR
125.0000 mg | Freq: Once | INTRAMUSCULAR | Status: AC | PRN
  Administered 2023-01-30: 125 mg via INTRAVENOUS

## 2023-01-30 MED ORDER — EPINEPHRINE 0.3 MG/0.3ML IJ SOAJ
0.3000 mg | Freq: Once | INTRAMUSCULAR | Status: DC | PRN
  Administered 2023-01-30: 0.3 mg via INTRAMUSCULAR

## 2023-01-30 MED ORDER — PREDNISONE 10 MG (21) PO TBPK: ORAL_TABLET | Freq: Every day | ORAL | 0 refills | Status: DC

## 2023-01-30 MED ORDER — SODIUM CHLORIDE 0.9 % IV SOLN: Freq: Once | INTRAVENOUS | Status: DC | PRN

## 2023-01-30 MED ORDER — SODIUM CHLORIDE 0.9 % IV SOLN
250.0000 mg | Freq: Once | INTRAVENOUS | Status: AC
  Administered 2023-01-30: 250 mg via INTRAVENOUS
  Filled 2023-01-30: qty 20

## 2023-01-30 MED ORDER — DIPHENHYDRAMINE HCL 50 MG/ML IJ SOLN
50.0000 mg | Freq: Once | INTRAMUSCULAR | Status: AC | PRN
  Administered 2023-01-30: 50 mg via INTRAVENOUS

## 2023-01-30 NOTE — Discharge Instructions (Addendum)
Please take your medications as prescribed. Take tylenol/ibuprofen for pain. I recommend close follow-up with PCP for reevaluation.  Please do not hesitate to return to emergency department if worrisome signs symptoms we discussed become apparent.  

## 2023-01-30 NOTE — Progress Notes (Signed)
Diagnosis: Iron Deficiency Anemia  Provider:  Chilton Greathouse MD  Procedure: IV Infusion  IV Type: Peripheral, IV Location: R Hand  Ferrlecit (ferric gluconate), Dose: 250 mg  Infusion Start Time: 1003  Infusion Stop Time: 1213  At 1214, Immediately after infusion stopped , Pt started mentioning some tightness and some pinching sensation  in  feet and hands.Vitals stable.  Later began with rashes .Emergency medicine -50 mg of Benadryl IVP, Pepcid 20 mg IVPB and 125 mg of Solumedrol IVP , NS  bolus given per protocol. DR. Wynona Neat came to assess the pt .Pt began having back pain and tightness in throat , EPI pen IM given.EMS called for transport. EMS arrived , Bedside report given and pt transported to hospital.   Post Infusion IV Care:  Left in place, Bolus NS Infusing  Discharge: Condition: Stable, Destination: ED  . AVS Declined  Performed by:  Adriana Mccallum, RN

## 2023-01-30 NOTE — ED Triage Notes (Signed)
PT arrives via EMS from [facility] where patient was receiving second iron transfusion. Pt reports she began having burning pain in both feet once infusion was complete. No rash, SOB at present. PT continues to c/o feet feeling hot. Pt received 0.3mg  IM epi, 125mg  solumedrol, pepcid, 4mg  zofran and 50mg  benedryl. Pt awake, alert, appropriate. VSS.

## 2023-01-30 NOTE — Telephone Encounter (Signed)
  Post for iron infusion-Ferrlecit  Has had a previous infusion that she tolerated well, this is her second infusion   Started having some chest discomfort, developed a rash, Back pain, leg swelling  110/81  110-115 heart rate  Respiratory rate about 20    Benadryl 50 mg Pepcid EpiPen 125 of Solu-Medrol  Recent pulmonary embolism, on Eliquis  Patient cannot take a deep breath    Good air entry bilaterally S1-S2 appreciated Some ankle swelling   Will recommend patient be sent to the emergency department for evaluation and close monitoring  Further management of allergic reaction to iron infusion Concern with recent pulmonary embolism with ongoing chest discomfort, discomfort with trying to have a conversation-has been compliant with Eliquis    Virl Diamond, MD [facility] Pager: See Loretha Stapler

## 2023-01-30 NOTE — ED Provider Notes (Signed)
[place] EMERGENCY DEPARTMENT AT [facility] Provider Note   CSN: [id number] Arrival date & time: 01/30/23  1337     History  Chief Complaint  Patient presents with   Allergic Reaction    Kelsey [name] is a 47 y.o. female history of hypothyroidism presents today for evaluation of allergic reaction.  Patient states that she developed reaction at the end of her iron infusion this morning.  She stated she felt burning pain her feet.  States is difficult for her to close both hands.  She reports swelling on her hands and urticaria on her forearm.  She denies chest pain, shortness of breath, fever, nausea, vomiting, bowel change, urinary symptoms.   Allergic Reaction     Past Medical History:  Diagnosis Date   Hypothyroidism    Past Surgical History:  Procedure Laterality Date   APPENDECTOMY     BREAST REDUCTION SURGERY     DILATION AND CURETTAGE OF UTERUS     LAPAROSCOPIC OVARIAN CYSTECTOMY     LIPOSUCTION       Home Medications Prior to Admission medications   Medication Sig Start Date End Date Taking? Authorizing Provider  apixaban (ELIQUIS) 5 MG TABS tablet Take 1 tablet (5 mg total) by mouth 2 (two) times daily. 01/09/23   Ardith Dark, MD  APIXABAN Everlene Balls) VTE STARTER PACK (10MG  AND 5MG ) Take as directed on package: start with two-5mg  tablets twice daily for 7 days. On day 8, switch to one-5mg  tablet twice daily. 01/04/23   Jacalyn Lefevre, MD  doxycycline (VIBRA-TABS) 100 MG tablet Take 1 tablet (100 mg total) by mouth 2 (two) times daily. 01/15/23   Ardith Dark, MD  escitalopram (LEXAPRO) 10 MG tablet Take 10 mg by mouth daily.    [provider]  ferrous sulfate 325 (65 FE) MG tablet Take 1 tablet (325 mg total) by mouth daily. 01/04/23   Jacalyn Lefevre, MD  gabapentin (NEURONTIN) 300 MG capsule Take 300 mg by mouth 3 (three) times daily as needed (pain). 01/08/23   [provider]  oxyCODONE (OXY IR/ROXICODONE) 5 MG immediate  release tablet Take 5 mg by mouth every 6 (six) hours as needed for moderate pain or severe pain. 01/08/23   [provider]  traZODone (DESYREL) 50 MG tablet TAKE 0.5-1 TABLETS BY MOUTH AT BEDTIME AS NEEDED FOR SLEEP. 12/14/21   Ardith Dark, MD      Allergies    Ferrlecit [na ferric gluc cplx in sucrose]    Review of Systems   Review of Systems Negative except as per HPI.  Physical Exam Updated Vital Signs BP (!) 120/100   Pulse 92   Temp 98.3 F (36.8 C) (Oral)   Resp 18   Ht 5\' 2"  (1.575 m)   Wt 69 kg   SpO2 100%   BMI 27.82 kg/m  Physical Exam Vitals and nursing note reviewed.  Constitutional:      Appearance: Normal appearance.  HENT:     Head: Normocephalic and atraumatic.     Mouth/Throat:     Mouth: Mucous membranes are moist.  Eyes:     General: No scleral icterus. Cardiovascular:     Rate and Rhythm: Normal rate and regular rhythm.     Pulses: Normal pulses.     Heart sounds: Normal heart sounds.  Pulmonary:     Effort: Pulmonary effort is normal.     Breath sounds: Normal breath sounds.  Abdominal:     General: Abdomen is  flat.     Palpations: Abdomen is soft.     Tenderness: There is no abdominal tenderness.  Musculoskeletal:        General: No deformity.     Comments: Tenderness to palpation to bottom of feet.  Swelling to hands bilaterally with uric area on the forearms.  Skin:    General: Skin is warm.     Findings: No rash.  Neurological:     General: No focal deficit present.     Mental Status: She is alert.  Psychiatric:        Mood and Affect: Mood normal.     ED Results / Procedures / Treatments   Labs (all labs ordered are listed, but only abnormal results are displayed) Labs Reviewed - No data to display  EKG None  Radiology No results found.  Procedures Procedures    Medications Ordered in ED Medications - No data to display  ED Course/ Medical Decision Making/ A&P Clinical Course as of 01/30/23 1551  Tue  Jan 30, 2023  1517 Anaphylaxis S/p outpatient treatment improving Obsivng 1 hour Px Epi Bendaryll 50 Q6 x1 day [CC]    Clinical Course User Index [CC] Glyn Ade, MD                             Medical Decision Making Risk OTC drugs. Prescription drug management.   This patient presents to the ED for allergic reaction, this involves an extensive number of treatment options, and is a complaint that carries with a high risk of complications and morbidity.  The differential diagnosis includes allergic reaction, anaphylaxis.  This is not an exhaustive list.  Problem list/ ED course/ Critical interventions/ Medical management: HPI: See above Vital signs within normal range and stable throughout visit. Laboratory/imaging studies significant for: See above. On physical examination, patient is afebrile and appears in no acute distress.  Patient is here after having allergic reaction after having an iron infusion this morning.  She reports burning pain in both of her feet, hand becomes tightened and develop urticaria in her forearms.  She was given IM epi, 125 mg Solu-Medrol, Pepcid, Zofran and Benadryl.  Patient is awake, alert, nontoxic-appearing in the ER.  She denies any shortness of breath.  Does not feel her throat is closing or trouble breathing.  Patient appears stable after 2 hours of observation.  Vital signs are within normal limits.  Given Tylenol for pain.  Will send patient home with EpiPen and Benadryl and steroid.  Advised patient to follow-up with her primary care physician for reevaluation.  Strict return precaution discussed. I have reviewed the patient home medicines and have made adjustments as needed.  Cardiac monitoring/EKG: The patient was maintained on a cardiac monitor.  I personally reviewed and interpreted the cardiac monitor which showed an underlying rhythm of: sinus rhythm.  Additional history obtained: External records from outside source obtained and  reviewed including: Chart review including previous notes, labs, imaging.  Consultations obtained:  Disposition Continued outpatient therapy. Follow-up with PCP recommended for reevaluation of symptoms. Treatment plan discussed with patient.  Pt acknowledged understanding was agreeable to the plan. Worrisome signs and symptoms were discussed with patient, and patient acknowledged understanding to return to the ED if they noticed these signs and symptoms. Patient was stable upon discharge.   This chart was dictated using voice recognition software.  Despite best efforts to proofread,  errors can occur which can change the documentation  meaning.          Final Clinical Impression(s) / ED Diagnoses Final diagnoses:  Allergic reaction, initial encounter    Rx / DC Orders ED Discharge Orders          Ordered    diphenhydrAMINE (BENADRYL) 25 MG tablet  Every 6 hours        01/30/23 1622    predniSONE (STERAPRED UNI-PAK 21 TAB) 10 MG (21) TBPK tablet  Daily        01/30/23 1622    EPINEPHrine 0.3 mg/0.3 mL IJ SOAJ injection  As needed        01/30/23 1622              Jeanelle Malling, [place] 01/30/23 1629    Glyn Ade, MD 02/01/23 1443

## 2023-02-06 ENCOUNTER — Ambulatory Visit: Payer: BC Managed Care – PPO

## 2023-02-07 NOTE — Progress Notes (Deleted)
Kelsey [name] D.[name] [address] [place] [address] Phone: [phone]   Assessment and Plan:     There are no diagnoses linked to this encounter.  *** - Patient has received significant relief with OMT in the past.  Elects for repeat OMT today.  Tolerated well per note below. - Decision today to treat with OMT was based on Physical Exam   After verbal consent patient was treated with HVLA (high velocity low amplitude), ME (muscle energy), FPR (flex positional release), ST (soft tissue), PC/PD (Pelvic Compression/ Pelvic Decompression) techniques in cervical, rib, thoracic, lumbar, and pelvic areas. Patient tolerated the procedure well with improvement in symptoms.  Patient educated on potential side effects of soreness and recommended to rest, hydrate, and use Tylenol as needed for pain control.   Pertinent previous records reviewed include ***   Follow Up: ***     Subjective:   I, Kelsey [name], am serving as a Neurosurgeon for Doctor Richardean Sale  Chief Complaint: neck and shoulder pain    HPI:    09/26/2022 Patient is a 47 year old female complaining of neck and shoulder pain. Patient states has been going on for months , she has flares out of no where, neck and shoulders tighten and dont let go, thinks it might be stress related, sometimes she does have numbness and tingling down to her fingers, no meds for the pain , upper trap pain, at rest she has a little pain , decreased ROM due to pain notes she felt a crack yesterday that was painful and then it felt good    10/17/2022 Patient states that she is okay    11/07/2022 Patient states she is okay    12/05/2022 Patient states she is good ,better    12/28/2022 Patient states moderate management of neck and upper back sx, continues to have periscapular pain. Notes flare-up of lower back pain, bilateral. Pain at posterior aspect of the hips/SI joint. Denies sharp shooting pain, n/t, weakness in  B LE. Sx x 4 days. Has tried HEP and TheraGun. Scheduled for surgery on Monday.   02/08/2023 Patient states    Relevant Historical Information: None pertinent   Additional pertinent review of systems negative.  Current Outpatient Medications  Medication Sig Dispense Refill   apixaban (ELIQUIS) 5 MG TABS tablet Take 1 tablet (5 mg total) by mouth 2 (two) times daily. 60 tablet 1   APIXABAN (ELIQUIS) VTE STARTER PACK (10MG  AND 5MG ) Take as directed on package: start with two-5mg  tablets twice daily for 7 days. On day 8, switch to one-5mg  tablet twice daily. 1 each 0   diphenhydrAMINE (BENADRYL) 25 MG tablet Take 1 tablet (25 mg total) by mouth every 6 (six) hours. 20 tablet 0   doxycycline (VIBRA-TABS) 100 MG tablet Take 1 tablet (100 mg total) by mouth 2 (two) times daily. 14 tablet 0   EPINEPHrine 0.3 mg/0.3 mL IJ SOAJ injection Inject 0.3 mg into the muscle as needed for anaphylaxis. 1 each 0   escitalopram (LEXAPRO) 10 MG tablet Take 10 mg by mouth daily.     ferrous sulfate 325 (65 FE) MG tablet Take 1 tablet (325 mg total) by mouth daily. 30 tablet 0   gabapentin (NEURONTIN) 300 MG capsule Take 300 mg by mouth 3 (three) times daily as needed (pain).     oxyCODONE (OXY IR/ROXICODONE) 5 MG immediate release tablet Take 5 mg by mouth every 6 (six) hours as needed for moderate pain  or severe pain.     predniSONE (STERAPRED UNI-PAK 21 TAB) 10 MG (21) TBPK tablet Take by mouth daily. Take 6 tabs by mouth daily  for 2 days, then 5 tabs for 2 days, then 4 tabs for 2 days, then 3 tabs for 2 days, 2 tabs for 2 days, then 1 tab by mouth daily for 2 days 42 tablet 0   traZODone (DESYREL) 50 MG tablet TAKE 0.5-1 TABLETS BY MOUTH AT BEDTIME AS NEEDED FOR SLEEP. 90 tablet 2   No current facility-administered medications for this visit.      Objective:     There were no vitals filed for this visit.    There is no height or weight on file to calculate BMI.    Physical Exam:     General:  Well-appearing, cooperative, sitting comfortably in no acute distress.   OMT Physical Exam:  ASIS Compression Test: Positive Right Cervical: TTP paraspinal, *** Rib: Bilateral elevated first rib with TTP Thoracic: TTP paraspinal,*** Lumbar: TTP paraspinal,*** Pelvis: Right anterior innominate  Electronically signed by:  Kelsey [name] D.[name] 7:19 AM 02/07/23

## 2023-02-08 ENCOUNTER — Ambulatory Visit: Payer: BC Managed Care – PPO | Admitting: Sports Medicine

## 2023-02-14 ENCOUNTER — Ambulatory Visit: Payer: BC Managed Care – PPO

## 2023-03-13 ENCOUNTER — Other Ambulatory Visit: Payer: Self-pay | Admitting: Sports Medicine

## 2023-03-30 ENCOUNTER — Emergency Department (HOSPITAL_BASED_OUTPATIENT_CLINIC_OR_DEPARTMENT_OTHER): Payer: BC Managed Care – PPO | Admitting: Radiology

## 2023-03-30 ENCOUNTER — Other Ambulatory Visit: Payer: Self-pay

## 2023-03-30 ENCOUNTER — Encounter (HOSPITAL_BASED_OUTPATIENT_CLINIC_OR_DEPARTMENT_OTHER): Payer: Self-pay | Admitting: Emergency Medicine

## 2023-03-30 ENCOUNTER — Emergency Department (HOSPITAL_BASED_OUTPATIENT_CLINIC_OR_DEPARTMENT_OTHER): Payer: BC Managed Care – PPO

## 2023-03-30 ENCOUNTER — Telehealth: Payer: Self-pay | Admitting: Family Medicine

## 2023-03-30 ENCOUNTER — Emergency Department (HOSPITAL_BASED_OUTPATIENT_CLINIC_OR_DEPARTMENT_OTHER)
Admission: EM | Admit: 2023-03-30 | Discharge: 2023-03-30 | Disposition: A | Discharge status: Home / Self Care | Payer: BC Managed Care – PPO | Attending: Emergency Medicine | Admitting: Emergency Medicine

## 2023-03-30 ENCOUNTER — Encounter: Payer: Self-pay | Admitting: Family Medicine

## 2023-03-30 DIAGNOSIS — M79605 Pain in left leg: Secondary | ICD-10-CM | POA: Diagnosis not present

## 2023-03-30 DIAGNOSIS — Z7901 Long term (current) use of anticoagulants: Secondary | ICD-10-CM | POA: Diagnosis not present

## 2023-03-30 DIAGNOSIS — K8020 Calculus of gallbladder without cholecystitis without obstruction: Secondary | ICD-10-CM | POA: Diagnosis not present

## 2023-03-30 DIAGNOSIS — R0789 Other chest pain: Secondary | ICD-10-CM | POA: Insufficient documentation

## 2023-03-30 DIAGNOSIS — M7981 Nontraumatic hematoma of soft tissue: Secondary | ICD-10-CM | POA: Insufficient documentation

## 2023-03-30 DIAGNOSIS — M7989 Other specified soft tissue disorders: Secondary | ICD-10-CM | POA: Diagnosis not present

## 2023-03-30 DIAGNOSIS — R079 Chest pain, unspecified: Secondary | ICD-10-CM

## 2023-03-30 DIAGNOSIS — R5383 Other fatigue: Secondary | ICD-10-CM | POA: Insufficient documentation

## 2023-03-30 DIAGNOSIS — R0602 Shortness of breath: Secondary | ICD-10-CM | POA: Diagnosis not present

## 2023-03-30 LAB — BASIC METABOLIC PANEL
Anion gap: 7 (ref 5–15)
BUN: 11 mg/dL (ref 6–20)
CO2: 29 mmol/L (ref 22–32)
Calcium: 9.8 mg/dL (ref 8.9–10.3)
Chloride: 102 mmol/L (ref 98–111)
Creatinine, Ser: 0.73 mg/dL (ref 0.44–1.00)
GFR, Estimated: >60 mL/min (ref >60)
Glucose, Bld: 96 mg/dL (ref 70–99)
Potassium: 3.7 mmol/L (ref 3.5–5.1)
Sodium: 138 mmol/L (ref 135–145)

## 2023-03-30 LAB — CBC
HCT: 40.4 % (ref 36.0–46.0)
Hemoglobin: 13.4 g/dL (ref 12.0–15.0)
MCH: 30.1 pg (ref 26.0–34.0)
MCHC: 33.2 g/dL (ref 30.0–36.0)
MCV: 90.8 fL (ref 80.0–100.0)
Platelets: 281 10*3/uL (ref 150–400)
RBC: 4.45 MIL/uL (ref 3.87–5.11)
RDW: 14.7 % (ref 11.5–15.5)
WBC: 4.7 10*3/uL (ref 4.0–10.5)
nRBC: 0 % (ref 0.0–0.2)

## 2023-03-30 LAB — TROPONIN I (HIGH SENSITIVITY)
Troponin I (High Sensitivity): <2 ng/L (ref <18)
Troponin I (High Sensitivity): <2 ng/L (ref <18)

## 2023-03-30 LAB — PREGNANCY, URINE: Preg Test, Ur: NEGATIVE

## 2023-03-30 MED ORDER — IOHEXOL 350 MG/ML SOLN
100.0000 mL | Freq: Once | INTRAVENOUS | Status: AC | PRN
  Administered 2023-03-30: 75 mL via INTRAVENOUS

## 2023-03-30 NOTE — Discharge Instructions (Addendum)
It was a pleasure taking care of you here in the emergency department  Your lab work and imaging today was reassuring.  I would recommend following up with your primary care provider to see if they would like to order an echocardiogram or have you follow-up with cardiology in the outpatient setting  Return for new or worsening symptoms

## 2023-03-30 NOTE — ED Provider Notes (Signed)
[place] EMERGENCY DEPARTMENT AT [facility] Provider Note   CSN: [id number] Arrival date & time: 03/30/23  1358    History  Chief Complaint  Patient presents with   Chest Pain    JENNYFER NICKOLSON is a 47 y.o. 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  She has had chest pain over the last 2 months since she was diagnosed with her PE.  Initially was thought due to the PE.  Her pain has now migrated into her left chest.  Not worse with exertion.  Not pleuritic.  He was thought due to abdominoplasty.  Missed her dose of Eliquis this morning however no missed doses prior to this.  Pain dull, achy, constant however has periods of worsening intensity and sharpness.  Does have history of anxiety however does not feel similar.  No recent surgeries intra-abdominal plasty in April.  She did recently travel from [place].  She did note while she was in [place] that she initially had a "cramp" to her posterior left calf as well as some bruising.  No recent injury or trauma.  She does note she did recently start working out 1 month ago is able to do the treadmill/cardio without any exertional chest pain or shortness of breath.  She has lifted some weights or these have been light weights.  Pain in chest is worse with palpation, specifically when she went to her massage therapist.  No swelling to lower extremities.  No unilateral weakness.  No nausea or vomiting.  Does have some fatigue.  No cough, GERD symptoms  She called her PCP today for an appointment however they referred her to the emergency department for further evaluation   HPI     Home Medications Prior to Admission medications   Medication Sig Start Date End Date Taking? Authorizing Provider  apixaban (ELIQUIS) 5 MG TABS tablet Take 1 tablet (5 mg total) by mouth 2 (two) times daily. 01/09/23   Ardith Dark, MD  APIXABAN Everlene Balls) VTE STARTER PACK (10MG  AND 5MG ) Take as directed on package: start with  two-5mg  tablets twice daily for 7 days. On day 8, switch to one-5mg  tablet twice daily. 01/04/23   Jacalyn Lefevre, MD  diphenhydrAMINE (BENADRYL) 25 MG tablet Take 1 tablet (25 mg total) by mouth every 6 (six) hours. 01/30/23   Jeanelle Malling, PA  doxycycline (VIBRA-TABS) 100 MG tablet Take 1 tablet (100 mg total) by mouth 2 (two) times daily. 01/15/23   Ardith Dark, MD  EPINEPHrine 0.3 mg/0.3 mL IJ SOAJ injection Inject 0.3 mg into the muscle as needed for anaphylaxis. 01/30/23   Jeanelle Malling, PA  escitalopram (LEXAPRO) 10 MG tablet Take 10 mg by mouth daily.    [provider]  ferrous sulfate 325 (65 FE) MG tablet Take 1 tablet (325 mg total) by mouth daily. 01/04/23   Jacalyn Lefevre, MD  gabapentin (NEURONTIN) 300 MG capsule Take 300 mg by mouth 3 (three) times daily as needed (pain). 01/08/23   [provider]  oxyCODONE (OXY IR/ROXICODONE) 5 MG immediate release tablet Take 5 mg by mouth every 6 (six) hours as needed for moderate pain or severe pain. 01/08/23   [provider]  predniSONE (STERAPRED UNI-PAK 21 TAB) 10 MG (21) TBPK tablet Take by mouth daily. Take 6 tabs by mouth daily  for 2 days, then 5 tabs for 2 days, then 4 tabs for 2 days, then 3 tabs for 2 days, 2 tabs for  2 days, then 1 tab by mouth daily for 2 days 01/30/23   Jeanelle Malling, PA  traZODone (DESYREL) 50 MG tablet TAKE 0.5-1 TABLETS BY MOUTH AT BEDTIME AS NEEDED FOR SLEEP. 12/14/21   Ardith Dark, MD      Allergies    Ferrlecit [na ferric gluc cplx in sucrose]    Review of Systems   Review of Systems  Constitutional: Negative.   HENT: Negative.    Respiratory:  Positive for shortness of breath.   Cardiovascular:  Positive for chest pain.  Gastrointestinal: Negative.   Genitourinary: Negative.   Musculoskeletal: Negative.   Skin: Negative.   Neurological: Negative.   All other systems reviewed and are negative.   Physical Exam Updated Vital Signs BP 94/67   Pulse (!) 57   Temp 98.9 F (37.2 C)    Resp 14   Ht 5\' 2"  (1.575 m)   Wt 70.3 kg   SpO2 100%   BMI 28.35 kg/m  Physical Exam Vitals and nursing note reviewed.  Constitutional:      General: She is not in acute distress.    Appearance: She is well-developed. She is not ill-appearing.  HENT:     Head: Normocephalic and atraumatic.  Eyes:     Pupils: Pupils are equal, round, and reactive to light.  Cardiovascular:     Rate and Rhythm: Normal rate.     Pulses:          Radial pulses are 2+ on the right side and 2+ on the left side.       Dorsalis pedis pulses are 2+ on the right side and 2+ on the left side.     Heart sounds: Normal heart sounds.  Pulmonary:     Effort: Pulmonary effort is normal. No respiratory distress.     Breath sounds: Normal breath sounds.     Comments: Clear bilaterally, speaks in full sentences without difficulty Chest:     Chest wall: Tenderness present.     Comments: Tenderness left chest wall, left greater than right no crepitus, overlying skin changes Abdominal:     General: Bowel sounds are normal. There is no distension.     Palpations: Abdomen is soft.  Musculoskeletal:        General: Normal range of motion.     Cervical back: Normal range of motion.     Right lower leg: No tenderness. No edema.     Left lower leg: No tenderness. No edema.     Comments: Compartments soft.  No bony tenderness.  2 cm area of ecchymosis to left distal posterior calf, nontender, no erythema or warmth.  No obvious disproportionate calf circumference  Skin:    General: Skin is warm and dry.     Capillary Refill: Capillary refill takes less than 2 seconds.  Neurological:     General: No focal deficit present.     Mental Status: She is alert.  Psychiatric:        Mood and Affect: Mood normal.     ED Results / Procedures / Treatments   Labs (all labs ordered are listed, but only abnormal results are displayed) Labs Reviewed  BASIC METABOLIC PANEL  CBC  PREGNANCY, URINE  TROPONIN I (HIGH  SENSITIVITY)  TROPONIN I (HIGH SENSITIVITY)    EKG None  Radiology US Venous Img Lower Unilateral Left  Result Date: 03/30/2023 CLINICAL DATA:  Left leg pain and swelling. EXAM: LEFT LOWER EXTREMITY VENOUS DOPPLER ULTRASOUND TECHNIQUE: Gray-scale sonography with compression,  as well as color and duplex ultrasound, were performed to evaluate the deep venous system(s) from the level of the common femoral vein through the popliteal and proximal calf veins. COMPARISON:  January 04, 2023 FINDINGS: VENOUS Normal compressibility of the common femoral, superficial femoral, and popliteal veins, as well as the visualized calf veins. Visualized portions of profunda femoral vein and great saphenous vein unremarkable. No filling defects to suggest DVT on grayscale or color Doppler imaging. Doppler waveforms show normal direction of venous flow, normal respiratory plasticity and response to augmentation. Limited views of the contralateral common femoral vein are unremarkable. OTHER None. Limitations: none IMPRESSION: Negative. Electronically Signed   By: Aram Candela M.D.   On: 03/30/2023 19:38   CT Angio Chest PE W and/or Wo Contrast  Result Date: 03/30/2023 CLINICAL DATA:  Chest pain and shortness of breath. Previous pulmonary embolism. EXAM: CT ANGIOGRAPHY CHEST WITH CONTRAST TECHNIQUE: Multidetector CT imaging of the chest was performed using the standard protocol during bolus administration of intravenous contrast. Multiplanar CT image reconstructions and MIPs were obtained to evaluate the vascular anatomy. RADIATION DOSE REDUCTION: This exam was performed according to the departmental dose-optimization program which includes automated exposure control, adjustment of the mA and/or kV according to patient size and/or use of iterative reconstruction technique. CONTRAST:  75mL OMNIPAQUE IOHEXOL 350 MG/ML SOLN COMPARISON:  01/10/2023 FINDINGS: Cardiovascular: Satisfactory opacification of pulmonary arteries  noted, and no pulmonary emboli identified. No evidence of thoracic aortic dissection or aneurysm. Mediastinum/Nodes: No masses or pathologically enlarged lymph nodes identified. Lungs/Pleura: No pulmonary mass, infiltrate, or effusion. Previously seen bilateral atelectasis has nearly completely resolved. Upper abdomen: Cholelithiasis again demonstrated. Musculoskeletal: No suspicious bone lesions identified. Review of the MIP images confirms the above findings. IMPRESSION: No evidence of pulmonary embolism or other acute findings. Cholelithiasis. Electronically Signed   By: Danae Orleans M.D.   On: 03/30/2023 17:54   DG Chest 2 View  Result Date: 03/30/2023 CLINICAL DATA:  Chest pain. EXAM: CHEST - 2 VIEW COMPARISON:  January 10, 2023. FINDINGS: The heart size and mediastinal contours are within normal limits. Both lungs are clear. The visualized skeletal structures are unremarkable. IMPRESSION: No active cardiopulmonary disease. Electronically Signed   By: Lupita Raider M.D.   On: 03/30/2023 14:49    Procedures Procedures    Medications Ordered in ED Medications  iohexol (OMNIPAQUE) 350 MG/ML injection 100 mL (75 mLs Intravenous Contrast Given 03/30/23 1721)    ED Course/ Medical Decision Making/ A&P   Cassidy is a 47 year old pleasant female here for evaluation of 2 months of chest pain.  Unfortunately was diagnosed with pulmonary embolism s/p abdominoplasty in April.  Has been compliant with her anticoagulation has had her missed dose this morning.  She has had a few months of chest pain, primarily to right however is now migrated to her left chest wall.  No recent injury or trauma.  Not necessarily exertional or pleuritic in nature.  Pain constant, dull however does have periods of increased intensity with sharp aspect.  Does not go into back.  No cough, palpitations.  She did recently returned from [place] however her pain has not changed in intensity or worsened since her trip.  She did note while  she was nervous she had a "cramp" in her left calf as well as some bruising.  She appears overall well.  Heart and lungs clear but abdomen soft, tender.  Her compartments are soft.  She does have 2 cm area of bruising to her  left posterior calf however no obvious distal portion of calf circumference.  Will plan on labs, imaging and reassess  Labs and imaging personally viewed and interpreted:  CBC without leukocytosis 9 metabolic panel without significant abnormality Pregnancy test negative Troponin delta neg Chest x-ray without cardiomegaly, pulm edema, pneumothorax, infiltrates EKG without ischemic changes Ultrasound left lower extremity CTA chest neg for PE, infection  Patient reassessed.  We discussed her labs and imaging.  Heart score 1, age.  Symptoms do not seem consistent with ACS, unstable angina, dissection, pneumothorax, PE, AAA.   She does have some chest wall tenderness, question costochondritis versus chest wall strain.  Will have her follow-up with PCP.  Do not feel she needs inpatient management at this time.  The patient has been appropriately medically screened and/or stabilized in the ED. I have low suspicion for any other emergent medical condition which would require further screening, evaluation or treatment in the ED or require inpatient management.  Patient is hemodynamically stable and in no acute distress.  Patient able to ambulate in department prior to ED.  Evaluation does not show acute pathology that would require ongoing or additional emergent interventions while in the emergency department or further inpatient treatment.  I have discussed the diagnosis with the patient and answered all questions.  Pain is been managed while in the emergency department and patient has no further complaints prior to discharge.  Patient is comfortable with plan discussed in room and is stable for discharge at this time.  I have discussed strict return precautions for returning to the  emergency department.  Patient was encouraged to follow-up with PCP/specialist refer to at discharge.                            Medical Decision Making Amount and/or Complexity of Data Reviewed External Data Reviewed: labs, radiology, ECG and notes. Labs: ordered. Decision-making details documented in ED Course. Radiology: ordered and independent interpretation performed. Decision-making details documented in ED Course. ECG/medicine tests: ordered and independent interpretation performed. Decision-making details documented in ED Course.  Risk OTC drugs. Prescription drug management. Decision regarding hospitalization. Diagnosis or treatment significantly limited by social determinants of health.          Final Clinical Impression(s) / ED Diagnoses Final diagnoses:  Chest pain, unspecified type    Rx / DC Orders ED Discharge Orders     None         Dezaray Shibuya A, PA-C 03/30/23 2231    Melene Plan, DO 04/02/23 [phone]

## 2023-03-30 NOTE — Telephone Encounter (Signed)
Please advise 

## 2023-03-30 NOTE — Telephone Encounter (Signed)
Final outcome: Go to ED Now. Patient currently at ED.   Patient Name First: Kelsey Last: [name] Gender: Female DOB: 1975/10/31 Age: 47 Y 7 M 28 D Return Phone Number: [phone] (Primary) Address: City/ State/ Zip: [place]  [address] Client [facility] - [facility], [facility] Provider Jacquiline Doe- MD Contact Type Call Who Is Calling Patient / Member / Family / Caregiver Call Type Triage / Clinical Relationship To Patient Self Return Phone Number [phone] (Primary) Chief Complaint CHEST PAIN - pain, pressure, heaviness or tightness Reason for Call Symptomatic / Request for Health Information Initial Comment Caller states she has been getting dull stabbing pain in her chest. Caller states it started out in the right side and has moved to the left and it is becoming more noticeable. Translation No Nurse Assessment Nurse: Izora Ribas, RN, Melanie Date/Time (Eastern Time): 03/30/2023 11:37:37 AM Confirm and document reason for call. If symptomatic, describe symptoms. ---Caller states has been getting dull stabbing in chest for the last 3 weeks. Mom makeover in April - breast lift, complication PE, still on blood thinners. 04/10/23 next follow up. Started out in the right side, has moved to the left, it is becoming more noticeable. Does the patient have any new or worsening symptoms? ---Yes Will a triage be completed? ---Yes Related visit to physician within the last 2 weeks? ---Yes Does the PT have any chronic conditions? (i.e. diabetes, asthma, this includes High risk factors for pregnancy, etc.) ---Yes List chronic conditions. ---Eliquis BID, Is the patient pregnant or possibly pregnant? (Ask all females between the ages of 72-55) ---No Is this a behavioral health or substance abuse call? ---No Guidelines Guideline Title Affirmed Question Affirmed Notes Nurse Date/Time  (Eastern Time) Chest Pain [1] Chest pain (or "angina") comes and goes AND [2] is happening more often (increasing in frequency) or getting worse (increasing in severity) (Exception: Chest pains that last only a few seconds.) Izora Ribas, RN, Melanie 03/30/2023 11:41:16 AM Disp. Time Lamount Cohen Time) Disposition Final User 03/30/2023 11:35:35 AM Send to Urgent Leron Croak, Denice 03/30/2023 11:42:55 AM Go to ED Now Yes Izora Ribas, RN, Melanie Final Disposition 03/30/2023 11:42:55 AM Go to ED Now Yes Izora Ribas, RN, Mittie Bodo Disagree/Comply Comply Caller Understands Yes PreDisposition Call Doctor Care Advice Given Per Guideline GO TO ED NOW: * You need to be seen in the Emergency Department. * Go to the ED at ___________ Hospital. * Leave now. Drive carefully. ANOTHER ADULT SHOULD DRIVE: * It is better and safer if another adult drives instead of you. CARE ADVICE given per Chest Pain (Adult) guideline. CALL EMS IF: BRING MEDICINES: NOTHING BY MOUTH Comments User: [name], RN Date/Time (Eastern Time): 03/30/2023 11:44:04 AM Will wait for husband to get home to watch the kids. Referrals GO TO FACILITY UNDECIDED

## 2023-03-30 NOTE — ED Triage Notes (Signed)
Pt arrives pov, steady gait, endorses dx with  PE RT side. PT c/o LT side CP radiating to LT side neck and LT arm x 3 weeks

## 2023-03-30 NOTE — Telephone Encounter (Signed)
FYI: This call has been transferred to triage nurse: Access Nurse. Once the result note has been entered staff can address the message at that time.  Patient called in with the following symptoms:  Red Word:chest pain   Please advise at Mobile [phone] (mobile)  Message is routed to Provider Pool.

## 2023-04-02 ENCOUNTER — Other Ambulatory Visit: Payer: Self-pay | Admitting: *Deleted

## 2023-04-02 DIAGNOSIS — R079 Chest pain, unspecified: Secondary | ICD-10-CM

## 2023-04-02 NOTE — Telephone Encounter (Signed)
Ok to place referral.  Katina Degree. [name], MD 04/02/2023 7:26 AM

## 2023-04-03 NOTE — Telephone Encounter (Signed)
Noted.  Katina Degree. [name], MD 04/03/2023 1:03 PM

## 2023-04-03 NOTE — Telephone Encounter (Signed)
Patient was at ED on 04/01/2023

## 2023-04-10 ENCOUNTER — Ambulatory Visit: Payer: BC Managed Care – PPO | Admitting: Family Medicine

## 2023-04-10 ENCOUNTER — Ambulatory Visit (INDEPENDENT_AMBULATORY_CARE_PROVIDER_SITE_OTHER)
Admission: RE | Admit: 2023-04-10 | Discharge: 2023-04-10 | Disposition: A | Discharge status: Home / Self Care | Payer: BC Managed Care – PPO | Source: Ambulatory Visit | Attending: Family Medicine | Admitting: Family Medicine

## 2023-04-10 ENCOUNTER — Encounter: Payer: Self-pay | Admitting: Family Medicine

## 2023-04-10 VITALS — BP 98/63 | HR 61 | Temp 97.5°F | Ht 62.0 in | Wt 156.2 lb

## 2023-04-10 DIAGNOSIS — I8390 Asymptomatic varicose veins of unspecified lower extremity: Secondary | ICD-10-CM | POA: Diagnosis not present

## 2023-04-10 DIAGNOSIS — K8020 Calculus of gallbladder without cholecystitis without obstruction: Secondary | ICD-10-CM | POA: Insufficient documentation

## 2023-04-10 DIAGNOSIS — F419 Anxiety disorder, unspecified: Secondary | ICD-10-CM

## 2023-04-10 DIAGNOSIS — I2699 Other pulmonary embolism without acute cor pulmonale: Secondary | ICD-10-CM

## 2023-04-10 DIAGNOSIS — M79671 Pain in right foot: Secondary | ICD-10-CM | POA: Diagnosis not present

## 2023-04-10 DIAGNOSIS — K8081 Other cholelithiasis with obstruction: Secondary | ICD-10-CM | POA: Diagnosis not present

## 2023-04-10 NOTE — Assessment & Plan Note (Signed)
Recent CT scan did not show any residual clots.  She can stop Eliquis.  She has been on this for over 3 months at this point.

## 2023-04-10 NOTE — Assessment & Plan Note (Signed)
Symptoms are much better controlled.  Back on Lexapro 10 mg daily per OB/GYN.

## 2023-04-10 NOTE — Progress Notes (Signed)
Kelsey [name] is a 47 y.o. female who presents today for an office visit.  Assessment/Plan:  New/Acute Problems: Chest Wall Pain  Improving. ED workup was negative.  Likely chest wall strain or costochondritis.  She will let us know if symptoms do not continue to improve.  Right Foot Pain Concern for fracture given traumatic event.  Will check x-ray to further evaluate.  Will place in post-op shoe today.  She can use over-the-counter meds as needed.  Discussed ice and elevation as well.  If x-ray confirms fracture will need to be referred to orthopedics.  Chronic Problems Addressed Today: Pulmonary embolism (HCC) Recent CT scan did not show any residual clots.  She can stop Eliquis.  She has been on this for over 3 months at this point.  Cholelithiasis Incidentally found the ED on CT scan.  Currently asymptomatic.  We discussed symptoms and reasons to return to care.  Will continue with watchful waiting for now.  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  We discussed conservative measures however she may be interested in removal.  Will refer to vascular surgery.  Anxiety Symptoms are much better controlled.  Back on Lexapro 10 mg daily per OB/GYN.     Subjective:  HPI:  See A/P for status of chronic conditions.  Patient is here today for follow-up.  Last seen about 3 months ago.  At our last visit she had recently been diagnosed with pulmonary embolism in setting of recent "mommy makeover" surgery and was started on Eliquis.  She was also found to be anemic and had just received a blood transfusion in the ED.  We have set her up for iron infusions however she did have an allergic reaction at this a couple of months ago and ended up going to the ED. that ED visit she was given Solu-Medrol, Pepcid, Zofran, Benadryl, and epinephrine.  She was observed for 2 hours in the ED and symptoms were stable.  She was discharged  home.  She went to the ED again 2 weeks ago with chest pain.  This has been going on since being diagnosed with a PE a couple of months ago.  In the ED, her pain was noted to be reproducible on exam.  She had labs including CBC and troponin which were negative.  Chest x-ray was reassuring.  EKG was normal.  She also had VTE workup with lower extremity Dopplers and CTA which was negative.  She was diagnosed with costochondritis versus chest wall strain and discharged home.  She did restart her Lexapro about a week ago after being off for several weeks. Since then she has noticed that chest pain has started to subsided.  She has been tolerating this well. This was prescribed by her OBGYN.   She did injure her right foot. She was helping her father move some equipement and it fell out of her hand and landed on her right foot. She did have a lot of swelling and brusing. This is improving over the last few days. She still having some swelling.  Pain with walking.  Pain with pushing on the area.       Objective:  Physical Exam: BP 98/63   Pulse 61   Temp (!) 97.5 F (36.4 C) (Temporal)   Ht 5\' 2"  (1.575 m)   Wt 156 lb 3.2 oz (70.9 kg)   SpO2 99%   BMI 28.57 kg/m  Gen: No acute distress, resting comfortably CV: Regular rate and rhythm with no murmurs appreciated Pulm: Normal work of breathing, clear to auscultation bilaterally with no crackles, wheezes, or rhonchi MUSCULOSKELETAL: - Right Foot: Ecchymosis and edema noted to dorsal aspect of right foot.  Tender to palpation along midfoot. Neuro: Grossly normal, moves all extremities Psych: Normal affect and thought content  Time Spent: 45 minutes of total time was spent on the date of the encounter performing the following actions: chart review prior to seeing the patient including recent Emergency Department visit, obtaining history, performing a medically necessary exam, counseling on the treatment plan, placing orders, and documenting in  our EHR.        Katina Degree. [name], MD 04/10/2023 11:16 AM

## 2023-04-10 NOTE — Assessment & Plan Note (Signed)
Incidentally found the ED on CT scan.  Currently asymptomatic.  We discussed symptoms and reasons to return to care.  Will continue with watchful waiting for now.

## 2023-04-10 NOTE — Patient Instructions (Signed)
It was very nice to see you today!  We we will get an x-ray of your foot to make sure that it is not broken.  Please wear the rigid soled shoe is much as you can tolerate.  I am glad that your chest wall pain is improving.  Please continue Lexapro.  Your CT scan did not show any blood clots.  It is okay for you to stop your blood thinner.  Return in about 6 months (around 10/11/2023) for Annual Physical.   Take care, Dr [name]  PLEASE NOTE:  If you had any lab tests, please let us know if you have not heard back within a few days. You may see your results on mychart before we have a chance to review them but we will give you a call once they are reviewed by [place].   If we ordered any referrals today, please let us know if you have not heard from their office within the next week.   If you had any urgent prescriptions sent in today, please check with the pharmacy within an hour of our visit to make sure the prescription was transmitted appropriately.   Please try these tips to maintain a healthy lifestyle:  Eat at least 3 REAL meals and 1-2 snacks per day.  Aim for no more than 5 hours between eating.  If you eat breakfast, please do so within one hour of getting up.   Each meal should contain half fruits/vegetables, one quarter protein, and one quarter carbs (no bigger than a computer mouse)  Cut down on sweet beverages. This includes juice, soda, and sweet tea.   Drink at least 1 glass of water with each meal and aim for at least 8 glasses per day  Exercise at least 150 minutes every week.

## 2023-04-10 NOTE — Assessment & Plan Note (Signed)
Patient with painful varicosity on the left lower leg for the last 7 to 8 months.  We discussed conservative measures however she may be interested in removal.  Will refer to vascular surgery.

## 2023-04-17 NOTE — Progress Notes (Signed)
Recommend referral to sports medicine.  Kelsey [name]. [name], MD 04/17/2023 1:49 PM

## 2023-04-17 NOTE — Progress Notes (Signed)
Great news!  X-rays did not show any fracture.  She should let us know if pain is not improving.  Kelsey [name]. [name], MD 04/17/2023 12:10 PM

## 2023-04-18 ENCOUNTER — Other Ambulatory Visit: Payer: Self-pay | Admitting: *Deleted

## 2023-04-18 ENCOUNTER — Encounter: Payer: Self-pay | Admitting: Family Medicine

## 2023-04-18 DIAGNOSIS — I8393 Asymptomatic varicose veins of bilateral lower extremities: Secondary | ICD-10-CM

## 2023-04-18 DIAGNOSIS — M79671 Pain in right foot: Secondary | ICD-10-CM

## 2023-04-20 ENCOUNTER — Ambulatory Visit (HOSPITAL_COMMUNITY)
Admission: RE | Admit: 2023-04-20 | Discharge: 2023-04-20 | Disposition: A | Discharge status: Home / Self Care | Payer: BC Managed Care – PPO | Source: Ambulatory Visit | Attending: Vascular Surgery | Admitting: Vascular Surgery

## 2023-04-20 DIAGNOSIS — I8393 Asymptomatic varicose veins of bilateral lower extremities: Secondary | ICD-10-CM | POA: Insufficient documentation

## 2023-05-16 ENCOUNTER — Encounter: Payer: BC Managed Care – PPO | Admitting: Vascular Surgery

## 2023-05-17 ENCOUNTER — Telehealth: Payer: Self-pay | Admitting: Vascular Surgery

## 2023-05-25 ENCOUNTER — Encounter: Payer: Self-pay | Admitting: Physician Assistant

## 2023-06-04 NOTE — Progress Notes (Unsigned)
Cardiology Office Note:  .   Date:  06/04/2023  ID:  Kelsey [name], DOB Aug 21, 1976, MRN [id number] PCP: Ardith Dark, MD  [facility] Providers Cardiologist:  None { Click to update primary MD,subspecialty MD or APP then REFRESH:1}   History of Present Illness: .   Kelsey [name] is a 47 y.o. female hx of PE off AC, referral from Dr. [name] for CP in the ED with a negative w/u. c/f chostochondritis.  ROS:  per HPI otherwise negative   Studies Reviewed: .        *** Risk Assessment/Calculations:   {Does this patient have ATRIAL FIBRILLATION?[phone]} No BP recorded.  {Refresh Note OR Click here to enter BP  :1}***       Physical Exam:   VS:  There were no vitals taken for this visit.   Wt Readings from Last 3 Encounters:  04/10/23 156 lb 3.2 oz (70.9 kg)  03/30/23 155 lb (70.3 kg)  01/30/23 152 lb 1.9 oz (69 kg)    GEN: Well nourished, well developed in no acute distress NECK: No JVD; No carotid bruits CARDIAC: ***RRR, no murmurs, rubs, gallops RESPIRATORY:  Clear to auscultation without rales, wheezing or rhonchi  ABDOMEN: Soft, non-tender, non-distended EXTREMITIES:  No edema; No deformity   ASSESSMENT AND PLAN: .   ***    {Are you ordering a CV Procedure (e.g. stress test, cath, DCCV, TEE, etc)?   Press F2        :[id number]}  Dispo: ***  Signed, Maisie Fus, MD

## 2023-06-06 ENCOUNTER — Ambulatory Visit: Payer: BC Managed Care – PPO | Attending: Internal Medicine | Admitting: Internal Medicine

## 2023-06-06 ENCOUNTER — Encounter: Payer: Self-pay | Admitting: Internal Medicine

## 2023-06-06 VITALS — BP 104/70 | HR 70 | Ht 62.0 in | Wt 157.8 lb

## 2023-06-06 DIAGNOSIS — R0782 Intercostal pain: Secondary | ICD-10-CM

## 2023-06-06 NOTE — Patient Instructions (Signed)
Medication Instructions:  Your physician recommends that you continue on your current medications as directed. Please refer to the Current Medication list given to you today.   *If you need a refill on your cardiac medications before your next appointment, please call your pharmacy*     Follow-Up: At [facility], you and your health needs are our priority.  As part of our continuing mission to provide you with exceptional heart care, we have created designated Provider Care Teams.  These Care Teams include your primary Cardiologist (physician) and Advanced Practice Providers (APPs -  Physician Assistants and Nurse Practitioners) who all work together to provide you with the care you need, when you need it.  We recommend signing up for the patient portal called "MyChart".  Sign up information is provided on this After Visit Summary.  MyChart is used to connect with patients for Virtual Visits (Telemedicine).  Patients are able to view lab/test results, encounter notes, upcoming appointments, etc.  Non-urgent messages can be sent to your provider as well.   To learn more about what you can do with MyChart, go to [email or web address].    Your next appointment:   Follow up as needed      Provider:   Maisie Fus, MD

## 2023-06-18 ENCOUNTER — Encounter: Payer: Self-pay | Admitting: Physician Assistant

## 2023-06-18 ENCOUNTER — Ambulatory Visit: Payer: BC Managed Care – PPO | Admitting: Physician Assistant

## 2023-06-18 VITALS — BP 99/68 | HR 64 | Temp 97.5°F | Resp 16 | Ht 62.0 in | Wt 157.3 lb

## 2023-06-18 DIAGNOSIS — I872 Venous insufficiency (chronic) (peripheral): Secondary | ICD-10-CM

## 2023-06-18 DIAGNOSIS — I8393 Asymptomatic varicose veins of bilateral lower extremities: Secondary | ICD-10-CM

## 2023-06-18 NOTE — Progress Notes (Signed)
Requested by:  Kelsey Dark, MD [address] [place],  [place] [address]  Reason for consultation: LLE varicose veins    History of Present Illness   Kelsey [name] is a 47 y.o. ([birth year]) female who presents for evaluation of painful varicose veins in left leg. She explains that this has been bothering her for over 1 year. She has prominent vein in her left calf which is location of most of her pain. The intense aching pain comes and goes but she can feel dull aching constantly. She has been taking OTC pain medication for this. At times her pain becomes throbbing and very itchy around vein. She also has noticed heaviness and tiredness in her legs. She does get mild swelling from time to time. 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 She exercises regularly. She does elevate her legs at night but has not felt much improvement. She also has been wearing knee high compression stockings for several months. She underwent some plastic surgery in April and shortly after developed a PE. She was told it likely came from her leg because at the time she did have some warmth, swelling and redness in her left leg. She had it evaluated in ER and no DVT was found. She has been wearing the compression stockings she received at hospital since then. She has no history otherwise of DVT. She does explain extensive history of venous disease in her mothers side of family.   Venous symptoms include: aching, heavy, tired, throbbing, itching, swelling Onset/duration:  > 1 year  Occupation:  self employed, Psychologist, sport and exercise Aggravating factors: sitting, standing Alleviating factors: elevation Compression:  yes Helps:  somewhat Pain medications:  yes, OTC  Previous vein procedures:  no History of DVT:  hx of PE, suspected LLE DVT  Past Medical History:  Diagnosis Date   Hypothyroidism     Past Surgical History:  Procedure Laterality Date   APPENDECTOMY      BREAST REDUCTION SURGERY     DILATION AND CURETTAGE OF UTERUS     LAPAROSCOPIC OVARIAN CYSTECTOMY     LIPOSUCTION      Social History   Socioeconomic History   Marital status: Married    Spouse name: Not on file   Number of children: Not on file   Years of education: Not on file   Highest education level: Not on file  Occupational History   Not on file  Tobacco Use   Smoking status: Never   Smokeless tobacco: Never  Substance and Sexual Activity   Alcohol use: Yes    Comment: Occ   Drug use: No   Sexual activity: Not on file  Other Topics Concern   Not on file  Social History Narrative   Not on file   Social Determinants of Health   Financial Resource Strain: Not on file  Food Insecurity: Not on file  Transportation Needs: Not on file  Physical Activity: Not on file  Stress: Not on file  Social Connections: Not on file  Intimate Partner Violence: Not on file    Family History  Problem Relation Age of Onset   Diabetes Mother    Hyperlipidemia Mother    Hypertension Mother    Heart disease Father    Hyperlipidemia Father    Hypertension Father    Diabetes Sister     Current Outpatient Medications  Medication Sig Dispense Refill   EPINEPHrine 0.3 mg/0.3 mL IJ  SOAJ injection Inject 0.3 mg into the muscle as needed for anaphylaxis. 1 each 0   escitalopram (LEXAPRO) 10 MG tablet Take 10 mg by mouth daily.     minoxidil (LONITEN) 2.5 MG tablet Take 2.5 mg by mouth daily. PATIENT TAKES 1/2 TABLET DAILY     tirzepatide (MOUNJARO) 5 MG/0.5ML Pen Inject 5 mg into the skin once a week.     No current facility-administered medications for this visit.    Allergies  Allergen Reactions   Ferrlecit [Na Ferric Gluc Cplx In Sucrose] Hives, Swelling and Other (See Comments)    Back pain, difficulty swallowing.    REVIEW OF SYSTEMS (negative unless checked):   Cardiac:  []  Chest pain or chest pressure? []  Shortness of breath upon activity? []  Shortness of breath  when lying flat? []  Irregular heart rhythm?  Vascular:  []  Pain in calf, thigh, or hip brought on by walking? []  Pain in feet at night that wakes you up from your sleep? []  Blood clot in your veins? [x]  Leg swelling?  Pulmonary:  []  Oxygen at home? []  Productive cough? []  Wheezing?  Neurologic:  []  Sudden weakness in arms or legs? []  Sudden numbness in arms or legs? []  Sudden onset of difficult speaking or slurred speech? []  Temporary loss of vision in one eye? []  Problems with dizziness?  Gastrointestinal:  []  Blood in stool? []  Vomited blood?  Genitourinary:  []  Burning when urinating? []  Blood in urine?  Psychiatric:  []  Major depression  Hematologic:  []  Bleeding problems? []  Problems with blood clotting?  Dermatologic:  []  Rashes or ulcers?  Constitutional:  []  Fever or chills?  Ear/Nose/Throat:  []  Change in hearing? []  Nose bleeds? []  Sore throat?  Musculoskeletal:  []  Back pain? []  Joint pain? []  Muscle pain?   Physical Examination     Vitals:   06/18/23 0957  BP: 99/68  Pulse: 64  Resp: 16  Temp: (!) 97.5 F (36.4 C)  TempSrc: Temporal  SpO2: 98%  Weight: 157 lb 4.8 oz (71.4 kg)  Height: 5\' 2"  (1.575 m)   Body mass index is 28.77 kg/m.  General:  WDWN in NAD; vital signs documented above Gait: Normal HENT: WNL, normocephalic Pulmonary: normal non-labored breathing , without wheezing Cardiac: regular HR Abdomen: soft Vascular Exam/Pulses:  Extremities: with varicose veins, without reticular veins, without edema, without stasis pigmentation, without lipodermatosclerosis, without ulcers Musculoskeletal: no muscle wasting or atrophy  Neurologic: A&O X 3;  No focal weakness or paresthesias are detected Psychiatric:  The pt has Normal affect.  Non-invasive Vascular Imaging   BLE Venous Insufficiency Duplex (04/20/23):  LLE: No DVT and SVT GSV reflux SFJ to proximal calf GSV diameter 0.46-0.80 cm SSV reflux Proximal calf CFV,  FV deep venous reflux   Medical Decision Making   Kelsey [name] is a 47 y.o. female who presents with: LLE chronic venous insufficiency with painful varicose veins of left leg. Duplex today shows no DVT or Svt. She does have deep reflux in CFV and FV. Her GSV is incompetent throughout. Her SSV is also incompetent in proximal calf. Her GSV is of adequate size to be considered for venous ablation.  Based on the patient's history and examination, I recommend: daily elevation of 20-30 minutes above level of heart, daily compression stocking use, exercise, weight reduction, refraining from prolonged sitting or standing. I discussed with the patient the use of her 20-30 mm thigh high compression stockings and need for 3 month trial of such. She was measured  and purchased a pair at today's visit The patient will follow up in 3 months with Dr. Randie Heinz for further evaluation for possible venous ablation.  Graceann Congress, PA-C [facility] Office: [phone]  06/18/2023, 10:44 AM  Clinic MD: Eudelia Bunch

## 2023-08-07 NOTE — Progress Notes (Unsigned)
08/08/2023 NECO VISCUSI [id number] 10/20/1975  Referring provider: Ardith Dark, MD Primary GI doctor: Dr. Tomasa Rand  ASSESSMENT AND PLAN:  Rectal Bleeding with abdominal pain and bloating  Abdominal pain improves after defecation sounds more consistent with IBS possible mixed. -Perform rectal exam today, noted possible internal hemorrhoid and soft stool. -Schedule colonoscopy to further evaluate at [facility] with Dr. Tomasa Rand. We have discussed the risks of bleeding, infection, perforation, medication reactions, and remote risk of death associated with colonoscopy. All questions were answered and the patient acknowledges these risk and wishes to proceed.  - Increase fiber/ water intake, decrease caffeine, increase activity level, FODMAP -Will add on Benefiber -Consider trial of Linzess post-colonoscopy if symptoms persist. -With patient's rectal tone on exam, history of 4 vaginal deliveries, incomplete bowel movements with alternating constipation and diarrhea will refer for pelvic floor physical therapy.  Episode of melena with family History of Stomach Cancer Discussed potential risk of H. pylori infection. -Will plan for endoscopic evaluation with colonoscopy at the [facility] to evaluate for peptic ulcer disease, H. pylori, gastritis.  Hemorrhoids No associated rectal pain, burning, or itching. Likely due to internal hemorrhoids.  Follow-up -Prescribe hydrocortisone for hemorrhoid treatment. Schedule follow-up appointment post-colonoscopy and endoscopy to discuss results and further management.    Patient Care Team: Ardith Dark, MD as PCP - General (Family Medicine) Wyline Mood Alben Spittle, MD as PCP - Cardiology (Cardiology)  HISTORY OF PRESENT ILLNESS: 47 y.o. female with a past medical history of provoked PE after surgery, off Eliquis, cholelithiasis incidentally found no symptoms, varicose veins and others listed below presents for evaluation of rectal bleeding.   Recent  Labs    09/07/22 0948 01/04/23 1803 01/09/23 1142 01/10/23 2107 01/15/23 1209 03/30/23 1413  HGB 12.9 7.6* 8.0 Repeated and verified X2.* 7.2* 9.9* 13.4   Discussed the use of AI scribe software for clinical note transcription with the patient, who gave verbal consent to proceed.  The patient, with a history of gastrointestinal issues dating back to their early twenties, presents with a chief complaint of alternating constipation and diarrhea. They report a long-standing history of bowel movement irregularities, characterized by either difficulty in passing stool or loose, frequent bowel movements. Over the past year and a half, these symptoms have become more consistent.  Recently, the patient has experienced episodes of fecal incontinence and rectal bleeding, described as bright red blood in the toilet following bowel movements. They deny any associated rectal pain, burning, or itching. The patient also reports lower abdominal pain, which is relieved upon defecation. They describe a sensation of incomplete bowel evacuation, which has been a persistent issue throughout their life. The patient also reports episodes of bloating, with visible abdominal distension that subsides following bowel movements. They have been using over-the-counter Miralax to manage their symptoms. They also report recent lower back pain, which they associate with their bowel issues, as it seems to improve with bowel movements and increased activity throughout the day. They deny any urinary issues or symptoms suggestive of pelvic organ prolapse.  The patient was previously on [facility], which was discontinued four weeks prior to the consultation due to adverse effects, including a single episode of tarry stool and persistent nausea. They deny any history of taking NSAIDs or pain medications and report only occasional alcohol consumption. The patient has a family history of stomach cancer and ulcers on their father's side,  but no known history of colon cancer.  They have been using a squatty potty and  have tried adding natural fibers to their diet to manage their symptoms.      She  reports that she has never smoked. She has never used smokeless tobacco. She reports current alcohol use. She reports that she does not use drugs.  RELEVANT LABS AND IMAGING:  Results   DIAGNOSTIC Rectal exam: No fissures, no external hemorrhoids, possible internal hemorrhoid, soft stool, negative for blood, reduced rectal tone (08/08/2023)      CBC    Component Value Date/Time   WBC 4.7 03/30/2023 1413   RBC 4.45 03/30/2023 1413   HGB 13.4 03/30/2023 1413   HCT 40.4 03/30/2023 1413   PLT 281 03/30/2023 1413   MCV 90.8 03/30/2023 1413   MCH 30.1 03/30/2023 1413   MCHC 33.2 03/30/2023 1413   RDW 14.7 03/30/2023 1413   LYMPHSABS 1.7 01/04/2023 1803   MONOABS 0.3 01/04/2023 1803   EOSABS 0.3 01/04/2023 1803   BASOSABS 0.0 01/04/2023 1803   Recent Labs    09/07/22 0948 01/04/23 1803 01/09/23 1142 01/10/23 2107 01/15/23 1209 03/30/23 1413  HGB 12.9 7.6* 8.0 Repeated and verified X2.* 7.2* 9.9* 13.4    CMP     Component Value Date/Time   NA 138 03/30/2023 1413   K 3.7 03/30/2023 1413   CL 102 03/30/2023 1413   CO2 29 03/30/2023 1413   GLUCOSE 96 03/30/2023 1413   BUN 11 03/30/2023 1413   CREATININE 0.73 03/30/2023 1413   CALCIUM 9.8 03/30/2023 1413   PROT 5.6 (L) 01/10/2023 2107   ALBUMIN 2.9 (L) 01/10/2023 2107   AST 38 01/10/2023 2107   ALT 34 01/10/2023 2107   ALKPHOS 56 01/10/2023 2107   BILITOT 0.7 01/10/2023 2107   GFRNONAA >60 03/30/2023 1413      Latest Ref Rng & Units 01/10/2023    9:07 PM 01/09/2023   11:42 AM 09/07/2022    9:48 AM  Hepatic Function  Total Protein 6.5 - 8.1 g/dL 5.6  5.6  7.0   Albumin 3.5 - 5.0 g/dL 2.9  3.5  4.5   AST 15 - 41 U/L 38  23  13   ALT 0 - 44 U/L 34  22  11   Alk Phosphatase 38 - 126 U/L 56  49  42   Total Bilirubin 0.3 - 1.2 mg/dL 0.7  0.8  1.1        Current Medications:   Current Outpatient Medications (Endocrine & Metabolic):    tirzepatide (MOUNJARO) 5 MG/0.5ML Pen, Inject 5 mg into the skin once a week. (Patient not taking: Reported on 08/08/2023)  Current Outpatient Medications (Cardiovascular):    EPINEPHrine 0.3 mg/0.3 mL IJ SOAJ injection, Inject 0.3 mg into the muscle as needed for anaphylaxis.   minoxidil (LONITEN) 2.5 MG tablet, Take 2.5 mg by mouth daily. PATIENT TAKES 1/2 TABLET DAILY     Current Outpatient Medications (Other):    escitalopram (LEXAPRO) 10 MG tablet, Take 10 mg by mouth daily.   hydrocortisone (ANUSOL-HC) 2.5 % rectal cream, Place 1 Application rectally 2 (two) times daily.   hydrocortisone (ANUSOL-HC) 25 MG suppository, Place 1 suppository (25 mg total) rectally 2 (two) times daily.   Na Sulfate-K Sulfate-Mg Sulf 17.5-3.13-1.6 GM/177ML SOLN, Use as directed; may use generic; goodrx card if insurance will not cover generic  Medical History:  Past Medical History:  Diagnosis Date   Hypothyroidism    Allergies:  Allergies  Allergen Reactions   Ferrlecit [Na Ferric Gluc Cplx In Sucrose] Hives, Swelling and Other (See Comments)    "  Iron infusion"   Back pain, difficulty swallowing.     Surgical History:  She  has a past surgical history that includes Appendectomy; Dilation and curettage of uterus; Laparoscopic ovarian cystectomy; Breast reduction surgery; and Liposuction. Family History:  Her family history includes Diabetes in her mother and sister; Heart disease in her father; Hyperlipidemia in her father and mother; Hypertension in her father and mother; Stomach cancer in her paternal grandfather.  REVIEW OF SYSTEMS  : All other systems reviewed and negative except where noted in the History of Present Illness.  PHYSICAL EXAM: BP 96/70   Pulse 68   Ht 5\' 2"  (1.575 m)   Wt 160 lb (72.6 kg)   SpO2 97%   BMI 29.26 kg/m  General Appearance: Well nourished, in no apparent distress. Head:    Normocephalic and atraumatic. Eyes:  sclerae anicteric,conjunctive pink  Respiratory: Respiratory effort normal, BS equal bilaterally without rales, rhonchi, wheezing. Cardio: RRR with no MRGs. Peripheral pulses intact.  Abdomen: Soft,  Non-distended ,active bowel sounds. mild tenderness in the RLQ. Without guarding and Without rebound. No masses. RECTAL: No external hemorrhoids or fissures observed. Internal hemorrhoid noted. Presence of soft stool, decreased rectal tone, and rectal examination negative for blood. No masses.  Musculoskeletal: Full ROM, Normal gait. Without edema. Skin:  Dry and intact without significant lesions or rashes Neuro: Alert and  oriented x4;  No focal deficits. Psych:  Cooperative. Normal mood and affect.    Doree Albee, PA-C 2:22 PM

## 2023-08-08 ENCOUNTER — Encounter: Payer: Self-pay | Admitting: Physician Assistant

## 2023-08-08 ENCOUNTER — Ambulatory Visit: Payer: BC Managed Care – PPO | Admitting: Physician Assistant

## 2023-08-08 VITALS — BP 96/70 | HR 68 | Ht 62.0 in | Wt 160.0 lb

## 2023-08-08 DIAGNOSIS — K649 Unspecified hemorrhoids: Secondary | ICD-10-CM | POA: Diagnosis not present

## 2023-08-08 DIAGNOSIS — K625 Hemorrhage of anus and rectum: Secondary | ICD-10-CM | POA: Diagnosis not present

## 2023-08-08 DIAGNOSIS — K921 Melena: Secondary | ICD-10-CM

## 2023-08-08 DIAGNOSIS — R159 Full incontinence of feces: Secondary | ICD-10-CM | POA: Diagnosis not present

## 2023-08-08 DIAGNOSIS — R198 Other specified symptoms and signs involving the digestive system and abdomen: Secondary | ICD-10-CM | POA: Diagnosis not present

## 2023-08-08 MED ORDER — HYDROCORTISONE ACETATE 25 MG RE SUPP: 25.0000 mg | Freq: Two times a day (BID) | RECTAL | 0 refills | Status: DC

## 2023-08-08 MED ORDER — HYDROCORTISONE (PERIANAL) 2.5 % EX CREA: 1.0000 | TOPICAL_CREAM | Freq: Two times a day (BID) | CUTANEOUS | 2 refills | Status: DC

## 2023-08-08 MED ORDER — NA SULFATE-K SULFATE-MG SULF 17.5-3.13-1.6 GM/177ML PO SOLN: ORAL | 0 refills | Status: DC

## 2023-08-08 NOTE — Patient Instructions (Addendum)
You have been scheduled for an endoscopy and colonoscopy. Please follow the written instructions given to you at your visit today.  Please pick up your prep supplies at the pharmacy within the next 1-3 days.  If you use inhalers (even only as needed), please bring them with you on the day of your procedure.  DO NOT TAKE 7 DAYS PRIOR TO TEST- Trulicity (dulaglutide) Ozempic, Wegovy (semaglutide) Mounjaro (tirzepatide) Bydureon Bcise (exanatide extended release)  DO NOT TAKE 1 DAY PRIOR TO YOUR TEST Rybelsus (semaglutide) Adlyxin (lixisenatide) Victoza (liraglutide) Byetta (exanatide) ___________________________________________________________________________  We have sent the following medications to your pharmacy for you to pick up at your convenience: Suprep   FIBER SUPPLEMENT You can do metamucil or fibercon once or twice a day but if this causes gas/bloating please switch to Benefiber or Citracel.  Fiber is good for constipation/diarrhea/irritable bowel syndrome.  It can also help with weight loss and can help lower your bad cholesterol (LDL).  Please do 1 TBSP in the morning in water, coffee, or tea.  It can take up to a month before you can see a difference with your bowel movements.  It is cheapest from [employer], [employer], [employer].   Linzess can consider samples after colonoscopy *IBS-C patients may begin to experience relief from belly pain and overall abdominal symptoms (pain, discomfort, and bloating) in about 1 week,  with symptoms typically improving over 12 weeks.  Take at least 30 minutes before the first meal of the day on an empty stomach You can have a loose stool if you eat a high-fat breakfast. Give it at least 7 days, may have more bowel movements during that time.   The diarrhea should go away and you should start having normal, complete, full bowel movements.  It may be helpful to start treatment when you can be near the comfort of your own bathroom, such as a  weekend.  After you are out we can send in a prescription if you did well, there is a prescription card  Toileting tips to help with your constipation - Drink at least 64-80 ounces of water/liquid per day. - Establish a time to try to move your bowels every day.  For many people, this is after a cup of coffee or after a meal such as breakfast. - Sit all of the way back on the toilet keeping your back fairly straight and while sitting up, try to rest the tops of your forearms on your upper thighs.   - Raising your feet with a step stool/squatty potty can be helpful to improve the angle that allows your stool to pass through the rectum. - Relax the rectum feeling it bulge toward the toilet water.  If you feel your rectum raising toward your body, you are contracting rather than relaxing. - Breathe in and slowly exhale. "Belly breath" by expanding your belly towards your belly button. Keep belly expanded as you gently direct pressure down and back to the anus.  A low pitched GRRR sound can assist with increasing intra-abdominal pressure.  (Can also trying to blow on a pinwheel and make it move, this helps with the same belly breathing) - Repeat 3-4 times. If unsuccessful, contract the pelvic floor to restore normal tone and get off the toilet.  Avoid excessive straining. - To reduce excessive wiping by teaching your anus to normally contract, place hands on outer aspect of knees and resist knee movement outward.  Hold 5-10 second then place hands just inside of knees and  resist inward movement of knees.  Hold 5 seconds.  Repeat a few times each way.  Go to the ER if unable to pass gas, severe AB pain, unable to hold down food, any shortness of breath of chest pain.  First do a trial off milk/lactose products if you use them.  Add fiber like benefiber or citracel once a day Increase activity  Please try to decrease stress. consider talking with PCP about anti anxiety medication or try head space app  for meditation. if any worsening symptoms like blood in stool, weight loss, please call the office     FODMAP stands for fermentable oligo-, di-, mono-saccharides and polyols (1). These are the scientific terms used to classify groups of carbs that are difficult for our body to digest and that are notorious for triggering digestive symptoms like bloating, gas, loose stools and stomach pain.   You can try low FODMAP diet  - start with eliminating just one column at a time that you feel may be a trigger for you. - the table at the very bottom contains foods that are low in FODMAPs   Sometimes trying to eliminate the FODMAP's from your diet is difficult or tricky, if you are stuggling with trying to do the elimination diet you can try an enzyme.  There is a food enzymes that you sprinkle in or on your food that helps break down the FODMAP. You can read more about the enzyme by going to this site: https://fodzyme.com/

## 2023-08-09 ENCOUNTER — Encounter: Payer: Self-pay | Admitting: Gastroenterology

## 2023-08-09 NOTE — Progress Notes (Signed)
Agree with the assessment and plan as outlined by Amanda Collier,  PA-C.  Scott E. Cunningham, MD  

## 2023-08-21 ENCOUNTER — Encounter: Payer: BC Managed Care – PPO | Admitting: Gastroenterology

## 2023-08-27 ENCOUNTER — Ambulatory Visit: Payer: BC Managed Care – PPO | Admitting: Gastroenterology

## 2023-08-27 ENCOUNTER — Encounter: Payer: Self-pay | Admitting: Gastroenterology

## 2023-08-27 VITALS — BP 102/62 | HR 63 | Temp 98.2°F | Resp 12 | Ht 62.0 in | Wt 160.0 lb

## 2023-08-27 DIAGNOSIS — K921 Melena: Secondary | ICD-10-CM

## 2023-08-27 DIAGNOSIS — R159 Full incontinence of feces: Secondary | ICD-10-CM

## 2023-08-27 DIAGNOSIS — K649 Unspecified hemorrhoids: Secondary | ICD-10-CM

## 2023-08-27 DIAGNOSIS — R198 Other specified symptoms and signs involving the digestive system and abdomen: Secondary | ICD-10-CM

## 2023-08-27 DIAGNOSIS — K625 Hemorrhage of anus and rectum: Secondary | ICD-10-CM

## 2023-08-27 MED ORDER — SODIUM CHLORIDE 0.9 % IV SOLN
500.0000 mL | Freq: Once | INTRAVENOUS | Status: DC
Start: 2023-08-27 — End: 2023-08-27

## 2023-08-27 NOTE — Op Note (Signed)
[facility] Patient Name: Kelsey [name] Procedure Date: 08/27/2023 3:57 PM MRN: [id number] Endoscopist: Lorin Picket E. Tomasa [name] , MD, [id number] Age: 47 Referring MD:  Date of Birth: September 05, 1976 Gender: Female Account #: [id number] Procedure:                Colonoscopy Indications:              Screening for colorectal malignant neoplasm,                            incidental rectal bleeding attributed to hemorrhoids Medicines:                Monitored Anesthesia Care Procedure:                Pre-Anesthesia Assessment:                           - Prior to the procedure, a History and Physical                            was performed, and patient medications and                            allergies were reviewed. The patient's tolerance of                            previous anesthesia was also reviewed. The risks                            and benefits of the procedure and the sedation                            options and risks were discussed with the patient.                            All questions were answered, and informed consent                            was obtained. Prior Anticoagulants: The patient has                            taken no anticoagulant or antiplatelet agents. ASA                            Grade Assessment: II - A patient with mild systemic                            disease. After reviewing the risks and benefits,                            the patient was deemed in satisfactory condition to                            undergo the procedure.  After obtaining informed consent, the colonoscope                            was passed under direct vision. Throughout the                            procedure, the patient's blood pressure, pulse, and                            oxygen saturations were monitored continuously. The                            CF [id number] #[id number] was introduced through the anus                             and advanced to the the terminal ileum, with                            identification of the appendiceal orifice and IC                            valve. The colonoscopy was performed without                            difficulty. The patient tolerated the procedure                            well. The quality of the bowel preparation was                            good. The terminal ileum, ileocecal valve,                            appendiceal orifice, and rectum were photographed.                            The bowel preparation used was SUPREP via split                            dose instruction. Scope In: 4:07:08 PM Scope Out: 4:19:01 PM Scope Withdrawal Time: 0 hours 8 minutes 4 seconds  Total Procedure Duration: 0 hours 11 minutes 53 seconds  Findings:                 The perianal and digital rectal examinations were                            normal. Pertinent negatives include normal                            sphincter tone and no palpable rectal lesions.                           Multiple medium-mouthed and small-mouthed  diverticula were found in the sigmoid colon,                            descending colon, transverse colon and ascending                            colon. There was no evidence of diverticular                            bleeding.                           The exam was otherwise normal throughout the                            examined colon.                           The terminal ileum appeared normal.                           The retroflexed view of the distal rectum and anal                            verge was normal and showed no anal or rectal                            abnormalities. Complications:            No immediate complications. Estimated Blood Loss:     Estimated blood loss: none. Impression:               - Moderate diverticulosis in the sigmoid colon, in                            the descending colon, in the  transverse colon and                            in the ascending colon. There was no evidence of                            diverticular bleeding.                           - The examined portion of the ileum was normal.                           - The distal rectum and anal verge are normal on                            retroflexion view.                           - No specimens collected. Recommendation:           - Patient has a contact number available for  emergencies. The signs and symptoms of potential                            delayed complications were discussed with the                            patient. Return to normal activities tomorrow.                            Written discharge instructions were provided to the                            patient.                           - Resume previous diet.                           - Continue present medications.                           - Repeat colonoscopy in 10 years for screening                            purposes.                           - Recommend daily fiber supplement (Metamucil or                            Benefiber) to improve stool bulk/consistency and                            reduce risk of diverticular complications. Kelsey Davlin E. Tomasa Rand, MD 08/27/2023 4:30:29 PM This report has been signed electronically.

## 2023-08-27 NOTE — Patient Instructions (Signed)
Educational handout provided to patient related to Diverticulosis  Resume previous diet-recommend daily fiber supplement( metamucil or benefiber) to improve stool bulk/consistency and reduce risk of diverticular complications.  Continue present medications  Repeat colonoscopy in 10 years for screening purposes  YOU HAD AN ENDOSCOPIC PROCEDURE TODAY AT THE [facility]:   Refer to the procedure report that was given to you for any specific questions about what was found during the examination.  If the procedure report does not answer your questions, please call your gastroenterologist to clarify.  If you requested that your care partner not be given the details of your procedure findings, then the procedure report has been included in a sealed envelope for you to review at your convenience later.  YOU SHOULD EXPECT: Some feelings of bloating in the abdomen. Passage of more gas than usual.  Walking can help get rid of the air that was put into your GI tract during the procedure and reduce the bloating. If you had a lower endoscopy (such as a colonoscopy or flexible sigmoidoscopy) you may notice spotting of blood in your stool or on the toilet paper. If you underwent a bowel prep for your procedure, you may not have a normal bowel movement for a few days.  Please Note:  You might notice some irritation and congestion in your nose or some drainage.  This is from the oxygen used during your procedure.  There is no need for concern and it should clear up in a day or so.  SYMPTOMS TO REPORT IMMEDIATELY:  Following lower endoscopy (colonoscopy or flexible sigmoidoscopy):  Excessive amounts of blood in the stool  Significant tenderness or worsening of abdominal pains  Swelling of the abdomen that is new, acute  Fever of 100F or higher  Following upper endoscopy (EGD)  Vomiting of blood or coffee ground material  New chest pain or pain under the shoulder blades  Painful or persistently  difficult swallowing  New shortness of breath  Fever of 100F or higher  Black, tarry-looking stools  For urgent or emergent issues, a gastroenterologist can be reached at any hour by calling [phone]. Do not use MyChart messaging for urgent concerns.    DIET:  We do recommend a small meal at first, but then you may proceed to your regular diet.  Drink plenty of fluids but you should avoid alcoholic beverages for 24 hours.  ACTIVITY:  You should plan to take it easy for the rest of today and you should NOT DRIVE or use heavy machinery until tomorrow (because of the sedation medicines used during the test).    FOLLOW UP: Our staff will call the number listed on your records the next business day following your procedure.  We will call around 7:15- 8:00 am to check on you and address any questions or concerns that you may have regarding the information given to you following your procedure. If we do not reach you, we will leave a message.     If any biopsies were taken you will be contacted by phone or by letter within the next 1-3 weeks.  Please call us at [phone] if you have not heard about the biopsies in 3 weeks.    SIGNATURES/CONFIDENTIALITY: You and/or your care partner have signed paperwork which will be entered into your electronic medical record.  These signatures attest to the fact that that the information above on your After Visit Summary has been reviewed and is understood.  Full responsibility of the  confidentiality of this discharge information lies with you and/or your care-partner.

## 2023-08-27 NOTE — Progress Notes (Unsigned)
Pt's states no medical or surgical changes since previsit or office visit. 

## 2023-08-27 NOTE — Op Note (Signed)
[facility] Patient Name: Kelsey [name] Procedure Date: 08/27/2023 3:58 PM MRN: [id number] Endoscopist: Lorin Picket E. Tomasa Rand , MD, [id number] Age: 47 Referring MD:  Date of Birth: Nov 14, 1975 Gender: Female Account #: [id number] Procedure:                Upper GI endoscopy Indications:              Melena Medicines:                Monitored Anesthesia Care Procedure:                Pre-Anesthesia Assessment:                           - Prior to the procedure, a History and Physical                            was performed, and patient medications and                            allergies were reviewed. The patient's tolerance of                            previous anesthesia was also reviewed. The risks                            and benefits of the procedure and the sedation                            options and risks were discussed with the patient.                            All questions were answered, and informed consent                            was obtained. Prior Anticoagulants: The patient has                            taken no anticoagulant or antiplatelet agents. ASA                            Grade Assessment: II - A patient with mild systemic                            disease. After reviewing the risks and benefits,                            the patient was deemed in satisfactory condition to                            undergo the procedure.                           After obtaining informed consent, the endoscope was  passed under direct vision. Throughout the                            procedure, the patient's blood pressure, pulse, and                            oxygen saturations were monitored continuously. The                            GIF [id number] #[id number] was introduced through the                            mouth, and advanced to the third part of duodenum.                            The upper GI endoscopy was accomplished  without                            difficulty. The patient tolerated the procedure                            well. Scope In: Scope Out: Findings:                 The examined esophagus was normal.                           The entire examined stomach was normal.                           The examined duodenum was normal. Complications:            No immediate complications. Estimated Blood Loss:     Estimated blood loss: none. Impression:               - Normal esophagus.                           - Normal stomach.                           - Normal examined duodenum.                           - No specimens collected.                           - Suspect patient's melenic stool was not secondary                            to bleeding from upper GI tract. Recommendation:           - Patient has a contact number available for                            emergencies. The signs and symptoms of potential  delayed complications were discussed with the                            patient. Return to normal activities tomorrow.                            Written discharge instructions were provided to the                            patient.                           - Resume previous diet.                           - Continue present medications.                           - Continue to monitor stool appearance. Leondro Coryell E. Tomasa Rand, MD 08/27/2023 4:24:25 PM This report has been signed electronically.

## 2023-08-27 NOTE — Progress Notes (Unsigned)
Sedate, gd SR, tolerated procedure well, VSS, report to RN 

## 2023-08-27 NOTE — Progress Notes (Unsigned)
History and Physical Interval Note:  08/27/2023 3:52 PM  Kelsey [name]  has presented today for endoscopic procedure(s), with the diagnosis of  Encounter Diagnoses  Name Primary?   Incontinence of feces, unspecified fecal incontinence type Yes   Alternating constipation and diarrhea    Rectal bleeding    Hemorrhoids, unspecified hemorrhoid type    Melena   .  The various methods of evaluation and treatment have been discussed with the patient and/or family. After consideration of risks, benefits and other options for treatment, the patient has consented to  the endoscopic procedure(s).   The patient's history has been reviewed, patient examined, no change in status, stable for endoscopic procedure(s).  I have reviewed the patient's chart and labs.  Questions were answered to the patient's satisfaction.     Naythan Douthit E. Tomasa Rand, MD [facility]

## 2023-08-28 ENCOUNTER — Telehealth: Payer: Self-pay

## 2023-08-28 NOTE — Telephone Encounter (Signed)
Left message on answering machine. 

## 2023-09-26 ENCOUNTER — Ambulatory Visit: Payer: BC Managed Care – PPO | Admitting: Vascular Surgery

## 2023-10-11 ENCOUNTER — Encounter: Payer: Self-pay | Admitting: Family Medicine

## 2023-10-11 ENCOUNTER — Ambulatory Visit (INDEPENDENT_AMBULATORY_CARE_PROVIDER_SITE_OTHER): Payer: BC Managed Care – PPO | Admitting: Family Medicine

## 2023-10-11 VITALS — BP 106/70 | HR 65 | Temp 98.2°F | Ht 62.0 in | Wt 164.0 lb

## 2023-10-11 DIAGNOSIS — Z131 Encounter for screening for diabetes mellitus: Secondary | ICD-10-CM | POA: Diagnosis not present

## 2023-10-11 DIAGNOSIS — Z13220 Encounter for screening for lipoid disorders: Secondary | ICD-10-CM

## 2023-10-11 DIAGNOSIS — E66811 Obesity, class 1: Secondary | ICD-10-CM | POA: Diagnosis not present

## 2023-10-11 DIAGNOSIS — F419 Anxiety disorder, unspecified: Secondary | ICD-10-CM

## 2023-10-11 DIAGNOSIS — Z0001 Encounter for general adult medical examination with abnormal findings: Secondary | ICD-10-CM | POA: Diagnosis not present

## 2023-10-11 DIAGNOSIS — Z1159 Encounter for screening for other viral diseases: Secondary | ICD-10-CM | POA: Diagnosis not present

## 2023-10-11 DIAGNOSIS — R197 Diarrhea, unspecified: Secondary | ICD-10-CM | POA: Insufficient documentation

## 2023-10-11 DIAGNOSIS — R002 Palpitations: Secondary | ICD-10-CM | POA: Insufficient documentation

## 2023-10-11 LAB — TSH: TSH: 1.19 u[IU]/mL (ref 0.35–5.50)

## 2023-10-11 LAB — COMPREHENSIVE METABOLIC PANEL
ALT: 12 U/L (ref 0–35)
AST: 14 U/L (ref 0–37)
Albumin: 4.7 g/dL (ref 3.5–5.2)
Alkaline Phosphatase: 44 U/L (ref 39–117)
BUN: 10 mg/dL (ref 6–23)
CO2: 28 meq/L (ref 19–32)
Calcium: 9.4 mg/dL (ref 8.4–10.5)
Chloride: 105 meq/L (ref 96–112)
Creatinine, Ser: 0.66 mg/dL (ref 0.40–1.20)
GFR: 105.37 mL/min (ref >60.00)
Glucose, Bld: 85 mg/dL (ref 70–99)
Potassium: 4.4 meq/L (ref 3.5–5.1)
Sodium: 139 meq/L (ref 135–145)
Total Bilirubin: 0.8 mg/dL (ref 0.2–1.2)
Total Protein: 7 g/dL (ref 6.0–8.3)

## 2023-10-11 LAB — CBC
HCT: 40.1 % (ref 36.0–46.0)
Hemoglobin: 13.2 g/dL (ref 12.0–15.0)
MCHC: 33 g/dL (ref 30.0–36.0)
MCV: 92.7 fL (ref 78.0–100.0)
Platelets: 292 10*3/uL (ref 150.0–400.0)
RBC: 4.33 Mil/uL (ref 3.87–5.11)
RDW: 14.1 % (ref 11.5–15.5)
WBC: 4.6 10*3/uL (ref 4.0–10.5)

## 2023-10-11 LAB — LIPID PANEL
Cholesterol: 177 mg/dL (ref 0–200)
HDL: 70.4 mg/dL (ref >39.00)
LDL Cholesterol: 91 mg/dL (ref 0–99)
NonHDL: 106.59
Total CHOL/HDL Ratio: 3
Triglycerides: 77 mg/dL (ref 0.0–149.0)
VLDL: 15.4 mg/dL (ref 0.0–40.0)

## 2023-10-11 LAB — VITAMIN D 25 HYDROXY (VIT D DEFICIENCY, FRACTURES): VITD: 21.8 ng/mL — ABNORMAL LOW (ref 30.00–100.00)

## 2023-10-11 LAB — VITAMIN B12: Vitamin B-12: 555 pg/mL (ref 211–911)

## 2023-10-11 LAB — HEMOGLOBIN A1C: Hgb A1c MFr Bld: 5.8 % (ref 4.6–6.5)

## 2023-10-11 NOTE — Assessment & Plan Note (Signed)
Symptoms stable on Lexapro 10 milligrams daily.  This is managed by her OB/GYN.

## 2023-10-11 NOTE — Assessment & Plan Note (Addendum)
No red flags.  She saw cardiology a few months ago .  Did have a recent exacerbation likely due to caffeine use.  We did discuss starting beta-blocker however she would like to hold off on this for now.  She will let us know if she develops any concerning signs or symptoms or if her symptoms become more bothersome.

## 2023-10-11 NOTE — Patient Instructions (Signed)
It was very nice to see you today!  We will check blood work today.  Please continue to work on diet and exercise.  You can try taking peppermint oil to see if this helps with your GI symptoms.  Please schedule appoint with gastroenterology soon if this is not improving.  Please let us know if you need referral back to see sports medicine.  Return in about 1 year (around 10/10/2024) for Annual Physical.   Take care, Dr [name]  PLEASE NOTE:  If you had any lab tests, please let us know if you have not heard back within a few days. You may see your results on mychart before we have a chance to review them but we will give you a call once they are reviewed by [place].   If we ordered any referrals today, please let us know if you have not heard from their office within the next week.   If you had any urgent prescriptions sent in today, please check with the pharmacy within an hour of our visit to make sure the prescription was transmitted appropriately.   Please try these tips to maintain a healthy lifestyle:  Eat at least 3 REAL meals and 1-2 snacks per day.  Aim for no more than 5 hours between eating.  If you eat breakfast, please do so within one hour of getting up.   Each meal should contain half fruits/vegetables, one quarter protein, and one quarter carbs (no bigger than a computer mouse)  Cut down on sweet beverages. This includes juice, soda, and sweet tea.   Drink at least 1 glass of water with each meal and aim for at least 8 glasses per day  Exercise at least 150 minutes every week.    Preventive Care 53-69 Years Old, Female Preventive care refers to lifestyle choices and visits with your health care provider that can promote health and wellness. Preventive care visits are also called wellness exams. What can I expect for my preventive care visit? Counseling Your health care provider may ask you questions about your: Medical history, including: Past medical  problems. Family medical history. Pregnancy history. Current health, including: Menstrual cycle. Method of birth control. Emotional well-being. Home life and relationship well-being. Sexual activity and sexual health. Lifestyle, including: Alcohol, nicotine or tobacco, and drug use. Access to firearms. Diet, exercise, and sleep habits. Work and work Astronomer. Sunscreen use. Safety issues such as seatbelt and bike helmet use. Physical exam Your health care provider will check your: Height and weight. These may be used to calculate your BMI (body mass index). BMI is a measurement that tells if you are at a healthy weight. Waist circumference. This measures the distance around your waistline. This measurement also tells if you are at a healthy weight and may help predict your risk of certain diseases, such as type 2 diabetes and high blood pressure. Heart rate and blood pressure. Body temperature. Skin for abnormal spots. What immunizations do I need?  Vaccines are usually given at various ages, according to a schedule. Your health care provider will recommend vaccines for you based on your age, medical history, and lifestyle or other factors, such as travel or where you work. What tests do I need? Screening Your health care provider may recommend screening tests for certain conditions. This may include: Lipid and cholesterol levels. Diabetes screening. This is done by checking your blood sugar (glucose) after you have not eaten for a while (fasting). Pelvic exam and Pap test. Hepatitis B  test. Hepatitis C test. HIV (human immunodeficiency virus) test. STI (sexually transmitted infection) testing, if you are at risk. Lung cancer screening. Colorectal cancer screening. Mammogram. Talk with your health care provider about when you should start having regular mammograms. This may depend on whether you have a family history of breast cancer. BRCA-related cancer screening. This may  be done if you have a family history of breast, ovarian, tubal, or peritoneal cancers. Bone density scan. This is done to screen for osteoporosis. Talk with your health care provider about your test results, treatment options, and if necessary, the need for more tests. Follow these instructions at home: Eating and drinking  Eat a diet that includes fresh fruits and vegetables, whole grains, lean protein, and low-fat dairy products. Take vitamin and mineral supplements as recommended by your health care provider. Do not drink alcohol if: Your health care provider tells you not to drink. You are pregnant, may be pregnant, or are planning to become pregnant. If you drink alcohol: Limit how much you have to 0-1 drink a day. Know how much alcohol is in your drink. In the [place] one drink equals one 12 oz bottle of beer (355 mL), one 5 oz glass of wine (148 mL), or one 1 oz glass of hard liquor (44 mL). Lifestyle Brush your teeth every morning and night with fluoride toothpaste. Floss one time each day. Exercise for at least 30 minutes 5 or more days each week. Do not use any products that contain nicotine or tobacco. These products include cigarettes, chewing tobacco, and vaping devices, such as e-cigarettes. If you need help quitting, ask your health care provider. Do not use drugs. If you are sexually active, practice safe sex. Use a condom or other form of protection to prevent STIs. If you do not wish to become pregnant, use a form of birth control. If you plan to become pregnant, see your health care provider for a prepregnancy visit. Take aspirin only as told by your health care provider. Make sure that you understand how much to take and what form to take. Work with your health care provider to find out whether it is safe and beneficial for you to take aspirin daily. Find healthy ways to manage stress, such as: Meditation, yoga, or listening to music. Journaling. Talking to a trusted  person. Spending time with friends and family. Minimize exposure to UV radiation to reduce your risk of skin cancer. Safety Always wear your seat belt while driving or riding in a vehicle. Do not drive: If you have been drinking alcohol. Do not ride with someone who has been drinking. When you are tired or distracted. While texting. If you have been using any mind-altering substances or drugs. Wear a helmet and other protective equipment during sports activities. If you have firearms in your house, make sure you follow all gun safety procedures. Seek help if you have been physically or sexually abused. What's next? Visit your health care provider once a year for an annual wellness visit. Ask your health care provider how often you should have your eyes and teeth checked. Stay up to date on all vaccines. This information is not intended to replace advice given to you by your health care provider. Make sure you discuss any questions you have with your health care provider. Document Revised: 03/02/2021 Document Reviewed: 03/02/2021 Elsevier Patient Education  2024 [employer].

## 2023-10-11 NOTE — Progress Notes (Signed)
Chief Complaint:  Kelsey [name] is a 48 y.o. female who presents today for her annual comprehensive physical exam.    Assessment/Plan:  New/Acute Problems: Back Pain  No red flags.She has followed with sports medicine for this in the past.  Has had a recent flareup the last several weeks.  She will be seeing physical therapy soon has below for pelvic floor dysfunction.  She will let us know if she would like to be referred back to sports medicine or physical therapy.  We discussed reasons to return to care.  Palpitation  Chronic Problems Addressed Today: Diarrhea Following with GI for this.  Symptoms are still persisting.  Sounds like she may have IBS.  We did recommend peppermint oil.  Will defer further management to gastroenterology.  Her recent colonoscopy and EGD were normal.  Obesity BMI 30 today on Mounjaro per physician in [place].  She is working on lifestyle interventions.  Anxiety Symptoms stable on Lexapro 10 milligrams daily.  This is managed by her OB/GYN.  Palpitation No red flags.  She saw cardiology a few months ago .  Did have a recent exacerbation likely due to caffeine use.  We did discuss starting beta-blocker however she would like to hold off on this for now.  She will let us know if she develops any concerning signs or symptoms or if her symptoms become more bothersome.  Preventative Healthcare: Check labs.  Flu vaccine declined.  Up-to-date on colon cancer screening.  Patient Counseling(The following topics were reviewed and/or handout was given):  -Nutrition: Stressed importance of moderation in sodium/caffeine intake, saturated fat and cholesterol, caloric balance, sufficient intake of fresh fruits, vegetables, and fiber.  -Stressed the importance of regular exercise.   -Substance Abuse: Discussed cessation/primary prevention of tobacco, alcohol, or other drug use; driving or other dangerous activities under the influence; availability of treatment for  abuse.   -Injury prevention: Discussed safety belts, safety helmets, smoke detector, smoking near bedding or upholstery.   -Sexuality: Discussed sexually transmitted diseases, partner selection, use of condoms, avoidance of unintended pregnancy and contraceptive alternatives.   -Dental health: Discussed importance of regular tooth brushing, flossing, and dental visits.  -Health maintenance and immunizations reviewed. Please refer to Health maintenance section.  Return to care in 1 year for next preventative visit.     Subjective:  HPI:  She has no acute complaints today. See Assessment / plan for status of chronic conditions. She has been following with gastroenterology since our last visit.  She did have a EGD and colonoscopy last month which were normal.  She is still having intermittent issues with diarrhea and constipation.  She has been referred to see pelvic floor specialist and will be seeing them in a couple of weeks.   She did have an episode of palpitations recently.  This is in setting of a lot of caffeine use while preparing for her mother's birthday.  She has not had any recurrence since then.  Symptoms lasted for several seconds and then subsided.  No chest pain.  No shortness of breath.  No dizziness.  Lifestyle Diet: Cutting down on portion sizes.  Exercise: Going to gym 3-4 times per week.      10/11/2023   10:07 AM  Depression screen PHQ 2/9  Decreased Interest 0  Down, Depressed, Hopeless 0  PHQ - 2 Score 0  Altered sleeping 1  Tired, decreased energy 1  Change in appetite 1  Feeling bad or failure about yourself  0  Trouble concentrating 1  Moving slowly or fidgety/restless 0  Suicidal thoughts 0  PHQ-9 Score 4  Difficult doing work/chores Not difficult at all    Health Maintenance Due  Topic Date Due   Hepatitis C Screening  Never done     ROS: Per HPI, otherwise a complete review of systems was negative.   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   Anemia 01/10/2023   Pulmonary embolism (HCC) 01/09/2023   Macromastia 12/28/2022   Paresthesia 09/07/2022   Obesity 02/10/2022   Anxiety 07/28/2021   Neck pain 07/28/2021   Nonintractable headache 07/28/2021   Insomnia 07/28/2021   Past Surgical History:  Procedure Laterality Date   APPENDECTOMY     BREAST REDUCTION SURGERY     DILATION AND CURETTAGE OF UTERUS     LAPAROSCOPIC OVARIAN CYSTECTOMY     LIPOSUCTION      Family History  Problem Relation Age of Onset   Diabetes Mother    Hyperlipidemia Mother    Hypertension Mother    Heart disease Father    Hyperlipidemia Father    Hypertension Father    Diabetes Sister    Stomach cancer Paternal Grandfather    Colon cancer Neg Hx    Colon polyps Neg Hx    Esophageal cancer Neg Hx    Pancreatic cancer Neg Hx     Medications- reviewed and updated Current Outpatient Medications  Medication Sig Dispense Refill   EPINEPHrine 0.3 mg/0.3 mL IJ SOAJ injection Inject 0.3 mg into the muscle as needed for anaphylaxis. 1 each 0   escitalopram (LEXAPRO) 10 MG tablet Take 10 mg by mouth daily.     minoxidil (LONITEN) 2.5 MG tablet Take 2.5 mg by mouth daily. PATIENT TAKES 1/2 TABLET DAILY     tirzepatide (MOUNJARO) 5 MG/0.5ML Pen Inject 5 mg into the skin once a week.     No current facility-administered medications for this visit.    Allergies-reviewed and updated Allergies  Allergen Reactions   Ferrlecit [Na Ferric Gluc Cplx In Sucrose] Hives, Swelling and Other (See Comments)    "Iron infusion"   Back pain, difficulty swallowing.    Social History   Socioeconomic History   Marital status: Married    Spouse name: Not on file   Number  of children: Not on file   Years of education: Not on file   Highest education level: Not on file  Occupational History   Not on file  Tobacco Use   Smoking status: Never   Smokeless tobacco: Never  Vaping Use   Vaping status: Never Used  Substance and Sexual Activity   Alcohol use: Yes    Comment: Occ   Drug use: No   Sexual activity: Not on file  Other Topics Concern   Not on file  Social History Narrative   Not on file   Social Drivers of Health   Financial Resource Strain: Not on file  Food Insecurity: Not on file  Transportation Needs: Not on file  Physical Activity: Not on file  Stress: Not on file  Social Connections: Not on file        Objective:  Physical Exam: BP 106/70   Pulse 65  Temp 98.2 F (36.8 C) (Temporal)   Ht 5\' 2"  (1.575 m)   Wt 164 lb (74.4 kg)   SpO2 100%   BMI 30.00 kg/m   Body mass index is 30 kg/m. Wt Readings from Last 3 Encounters:  10/11/23 164 lb (74.4 kg)  08/27/23 160 lb (72.6 kg)  08/08/23 160 lb (72.6 kg)   Gen: NAD, resting comfortably HEENT: TMs normal bilaterally. OP clear. No thyromegaly noted.  CV: RRR with no murmurs appreciated Pulm: NWOB, CTAB with no crackles, wheezes, or rhonchi GI: Normal bowel sounds present. Soft, Nontender, Nondistended. MSK: no edema, cyanosis, or clubbing noted Skin: warm, dry Neuro: CN2-12 grossly intact. Strength 5/5 in upper and lower extremities. Reflexes symmetric and intact bilaterally.  Psych: Normal affect and thought content     Kenlei Safi M. [name], MD 10/11/2023 10:46 AM

## 2023-10-11 NOTE — Assessment & Plan Note (Signed)
Following with GI for this.  Symptoms are still persisting.  Sounds like she may have IBS.  We did recommend peppermint oil.  Will defer further management to gastroenterology.  Her recent colonoscopy and EGD were normal.

## 2023-10-11 NOTE — Assessment & Plan Note (Signed)
BMI 30 today on Mounjaro per physician in [place].  She is working on lifestyle interventions.

## 2023-10-12 LAB — HEPATITIS C ANTIBODY: Hepatitis C Ab: NONREACTIVE

## 2023-10-16 ENCOUNTER — Encounter: Payer: Self-pay | Admitting: Family Medicine

## 2023-10-16 ENCOUNTER — Ambulatory Visit: Payer: BC Managed Care – PPO | Admitting: Sports Medicine

## 2023-10-16 ENCOUNTER — Ambulatory Visit (INDEPENDENT_AMBULATORY_CARE_PROVIDER_SITE_OTHER): Payer: BC Managed Care – PPO

## 2023-10-16 VITALS — HR 87 | Ht 62.0 in | Wt 164.0 lb

## 2023-10-16 DIAGNOSIS — M545 Low back pain, unspecified: Secondary | ICD-10-CM | POA: Diagnosis not present

## 2023-10-16 DIAGNOSIS — G8929 Other chronic pain: Secondary | ICD-10-CM | POA: Diagnosis not present

## 2023-10-16 DIAGNOSIS — M5137 Other intervertebral disc degeneration, lumbosacral region with discogenic back pain only: Secondary | ICD-10-CM | POA: Diagnosis not present

## 2023-10-16 DIAGNOSIS — M4807 Spinal stenosis, lumbosacral region: Secondary | ICD-10-CM | POA: Diagnosis not present

## 2023-10-16 MED ORDER — MELOXICAM 15 MG PO TABS: 15.0000 mg | ORAL_TABLET | Freq: Every day | ORAL | 0 refills | Status: DC

## 2023-10-16 NOTE — Progress Notes (Signed)
Vitamin D is low.  The rest of her labs are all stable.  Recommend she start 5000 IUs once daily.  We should recheck in 3 to 6 months.  We can recheck everything else in a year.

## 2023-10-16 NOTE — Progress Notes (Signed)
Aleen Sells D.[name] [address] [place] [address] Phone: [phone]   Assessment and Plan:     1. Chronic bilateral low back pain without sciatica -Chronic with exacerbation, initial sports medicine visit - Most consistent with musculoskeletal dysfunction, primarily in low back and hips that is led to ongoing pain for 3 months.  Significant improvement after chiropractic adjustment this morning - X-ray obtained in clinic.  My interpretation: No acute fracture or vertebral collapse.  6 lumbar vertebrae which represents anatomic variant - Start meloxicam 15 mg daily x2 weeks.  If still having pain after 2 weeks, complete 3rd-week of NSAID. May use remaining NSAID as needed once daily for pain control.  Do not to use additional over-the-counter NSAIDs (ibuprofen, naproxen, Advil, Aleve) while taking prescription NSAIDs.  May use Tylenol [phone] mg 2 to 3 times a day for breakthrough pain. - Recommend 1 week of relative rest and then  gradual return to physical activity.  Start activity at 50% (speed, duration, reps, sets, intensity) and allow 24 hours to assess for worsening pain.  If 50% is well-tolerated, may increase next activity to 75%.  If 75% is well-tolerated, may increase next physical activity to 100%.  If any of these levels cause pain, recommend dropping down to previous level for an additional 2-3 attempts before advancing. -Did not perform OMT today due to patient having chiropractor adjustment earlier this morning   Pertinent previous records reviewed include none  Follow Up: As needed if no improvement in 3 to 4 weeks.  Could consider OMT   Subjective:   I, Moenique Parris, am serving as a Neurosurgeon for Doctor Richardean Sale    Chief Complaint: neck and shoulder pain    HPI:    09/26/2022 Patient is a 48 year old female complaining of neck and shoulder pain. Patient states has been going on for months , she has flares out  of no where, neck and shoulders tighten and dont let go, thinks it might be stress related, sometimes she does have numbness and tingling down to her fingers, no meds for the pain , upper trap pain, at rest she has a little pain , decreased ROM due to pain notes she felt a crack yesterday that was painful and then it felt good    10/17/2022 Patient states that she is okay    11/07/2022 Patient states she is okay    12/05/2022 Patient states she is good ,better    12/28/2022 Patient states moderate management of neck and upper back sx, continues to have periscapular pain. Notes flare-up of lower back pain, bilateral. Pain at posterior aspect of the hips/SI joint. Denies sharp shooting pain, n/t, weakness in B LE. Sx x 4 days. Has tried HEP and TheraGun. Scheduled for surgery on Monday.   10/16/2023 Patient states low back , hip mostly left hip pain flare . She is not able to get out of bed . She was not able to get out of car today. Was seen by chiro and that helped some . Is interested in OMT   Relevant Historical Information: None pertinent  Additional pertinent review of systems negative.   Current Outpatient Medications:    EPINEPHrine 0.3 mg/0.3 mL IJ SOAJ injection, Inject 0.3 mg into the muscle as needed for anaphylaxis., Disp: 1 each, Rfl: 0   escitalopram (LEXAPRO) 10 MG tablet, Take 10 mg by mouth daily., Disp: , Rfl:    meloxicam (MOBIC) 15 MG tablet, Take  1 tablet (15 mg total) by mouth daily., Disp: 30 tablet, Rfl: 0   minoxidil (LONITEN) 2.5 MG tablet, Take 2.5 mg by mouth daily. PATIENT TAKES 1/2 TABLET DAILY, Disp: , Rfl:    tirzepatide (MOUNJARO) 5 MG/0.5ML Pen, Inject 5 mg into the skin once a week., Disp: , Rfl:    Objective:     Vitals:   10/16/23 1137  Pulse: 87  SpO2: 98%  Weight: 164 lb (74.4 kg)  Height: 5\' 2"  (1.575 m)      Body mass index is 30 kg/m.    Physical Exam:    Gen: Appears well, nad, nontoxic and pleasant Psych: Alert and oriented,  appropriate mood and affect Neuro: sensation intact, strength is 5/5 in upper and lower extremities, muscle tone wnl Skin: no susupicious lesions or rashes  Back - Normal skin, Spine with normal alignment and no deformity.   L3-5 tenderness to vertebral process palpation.   Bilateral lumbar paraspinous muscles are not tender and without spasm Mild bilateral TTP gluteal musculature Straight leg raise negative Trendelenberg negative Piriformis Test negative Gait normal  Mild pain with lumbar extension.  No pain with rotation  Electronically signed by:  Aleen Sells [name] 12:14 PM 10/16/23

## 2023-10-16 NOTE — Patient Instructions (Signed)
-   Start meloxicam 15 mg daily x2 weeks.  If still having pain after 2 weeks, complete 3rd-week of NSAID. May use remaining NSAID as needed once daily for pain control.  Do not to use additional over-the-counter NSAIDs (ibuprofen, naproxen, Advil, Aleve) while taking prescription NSAIDs.  May use Tylenol [phone] mg 2 to 3 times a day for breakthrough pain. 1 week of rest  -Recommend gradual return to physical activity.  Start activity at 50% (speed, duration, reps, sets, intensity) and allow 24 hours to assess for worsening pain.  If 50% is well-tolerated, may increase next activity to 75%.  If 75% is well-tolerated, may increase next physical activity to 100%.  If any of these levels cause pain, recommend dropping down to previous level for an additional 2-3 attempts before advancing. As needed follow up for MSK

## 2023-10-17 DIAGNOSIS — M545 Low back pain, unspecified: Secondary | ICD-10-CM | POA: Diagnosis not present

## 2023-10-17 DIAGNOSIS — M9902 Segmental and somatic dysfunction of thoracic region: Secondary | ICD-10-CM | POA: Diagnosis not present

## 2023-10-17 DIAGNOSIS — M9903 Segmental and somatic dysfunction of lumbar region: Secondary | ICD-10-CM | POA: Diagnosis not present

## 2023-10-17 DIAGNOSIS — M546 Pain in thoracic spine: Secondary | ICD-10-CM | POA: Diagnosis not present
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
## 2023-10-24 ENCOUNTER — Ambulatory Visit: Payer: BC Managed Care – PPO | Attending: Physician Assistant | Admitting: Physical Therapy

## 2023-10-24 ENCOUNTER — Other Ambulatory Visit: Payer: Self-pay

## 2023-10-24 ENCOUNTER — Encounter: Payer: Self-pay | Admitting: Sports Medicine

## 2023-10-24 ENCOUNTER — Encounter: Payer: Self-pay | Admitting: Physical Therapy

## 2023-10-24 DIAGNOSIS — L905 Scar conditions and fibrosis of skin: Secondary | ICD-10-CM | POA: Insufficient documentation

## 2023-10-24 DIAGNOSIS — R252 Cramp and spasm: Secondary | ICD-10-CM | POA: Insufficient documentation

## 2023-10-24 DIAGNOSIS — R278 Other lack of coordination: Secondary | ICD-10-CM | POA: Diagnosis not present

## 2023-10-24 DIAGNOSIS — R198 Other specified symptoms and signs involving the digestive system and abdomen: Secondary | ICD-10-CM | POA: Insufficient documentation

## 2023-10-24 DIAGNOSIS — R159 Full incontinence of feces: Secondary | ICD-10-CM | POA: Diagnosis not present

## 2023-10-24 DIAGNOSIS — M5459 Other low back pain: Secondary | ICD-10-CM | POA: Insufficient documentation

## 2023-10-29 IMAGING — MG MM DIGITAL DIAGNOSTIC UNILAT*L* W/ TOMO W/ CAD
6 series · 6 of 18 positions shown · non-contrast
Comparison: Previous exam(s).

CLINICAL DATA: The patient was called back for a left breast
asymmetry.

EXAM:
DIGITAL DIAGNOSTIC UNILATERAL LEFT MAMMOGRAM WITH TOMOSYNTHESIS AND
CAD; ULTRASOUND LEFT BREAST LIMITED
TECHNIQUE: Left digital diagnostic mammography and breast tomosynthesis was
performed. The images were evaluated with computer-aided detection.;
Targeted ultrasound examination of the left breast was performed.

[L MLO synth-2D (1 of 2)]
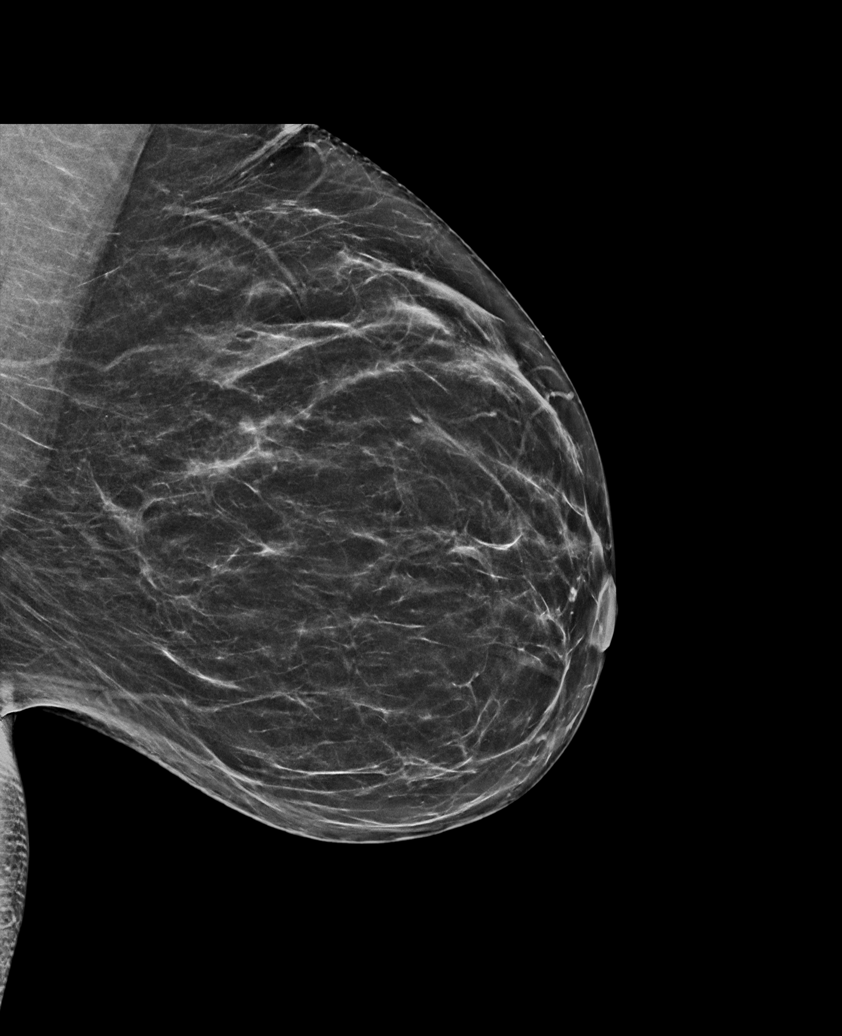

[L ML synth-2D]
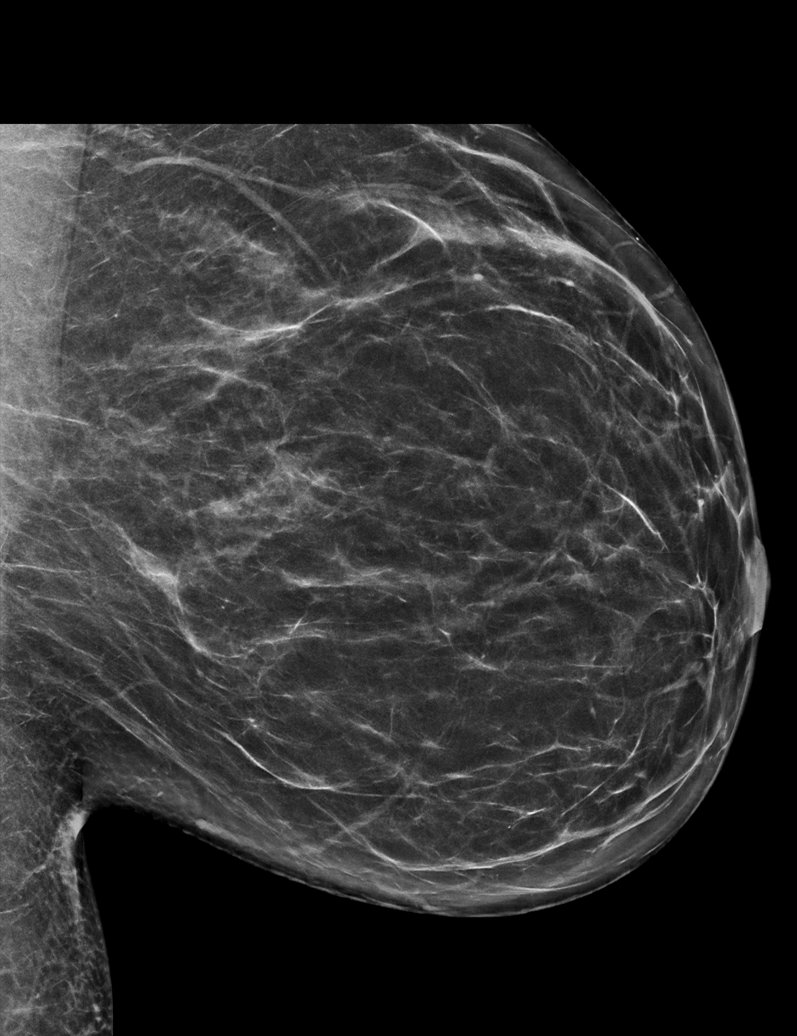

[L MLO synth-2D (2 of 2)]
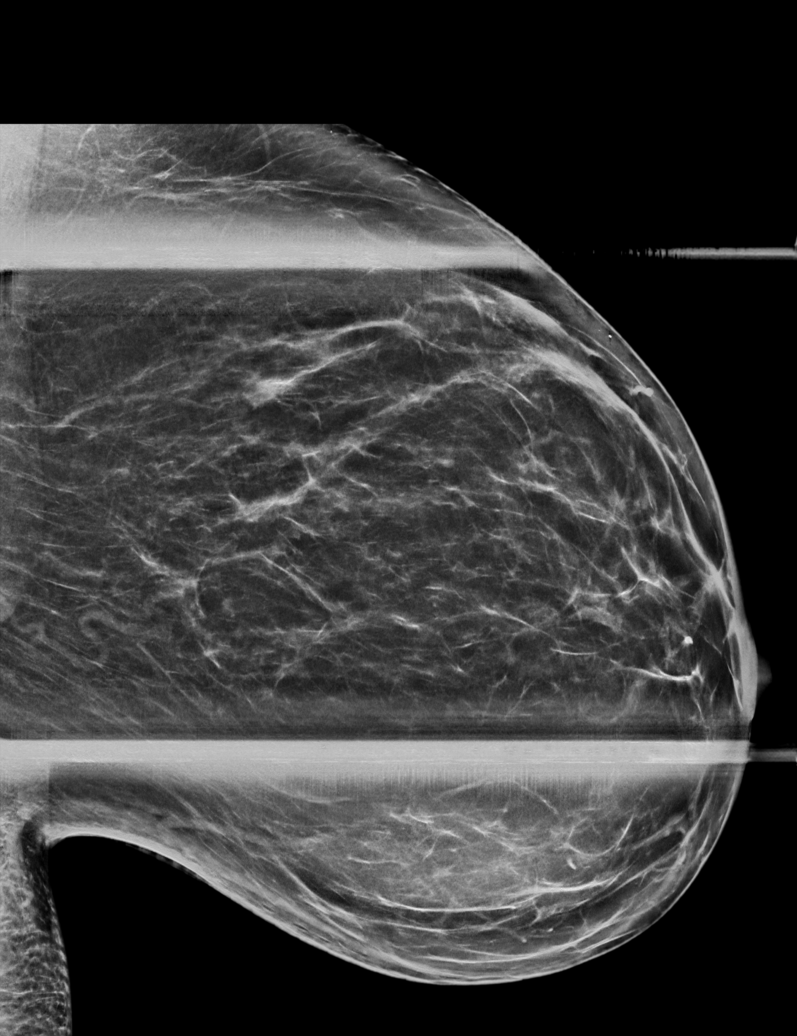

[L ML tomo · tomo slice 34/67.0]
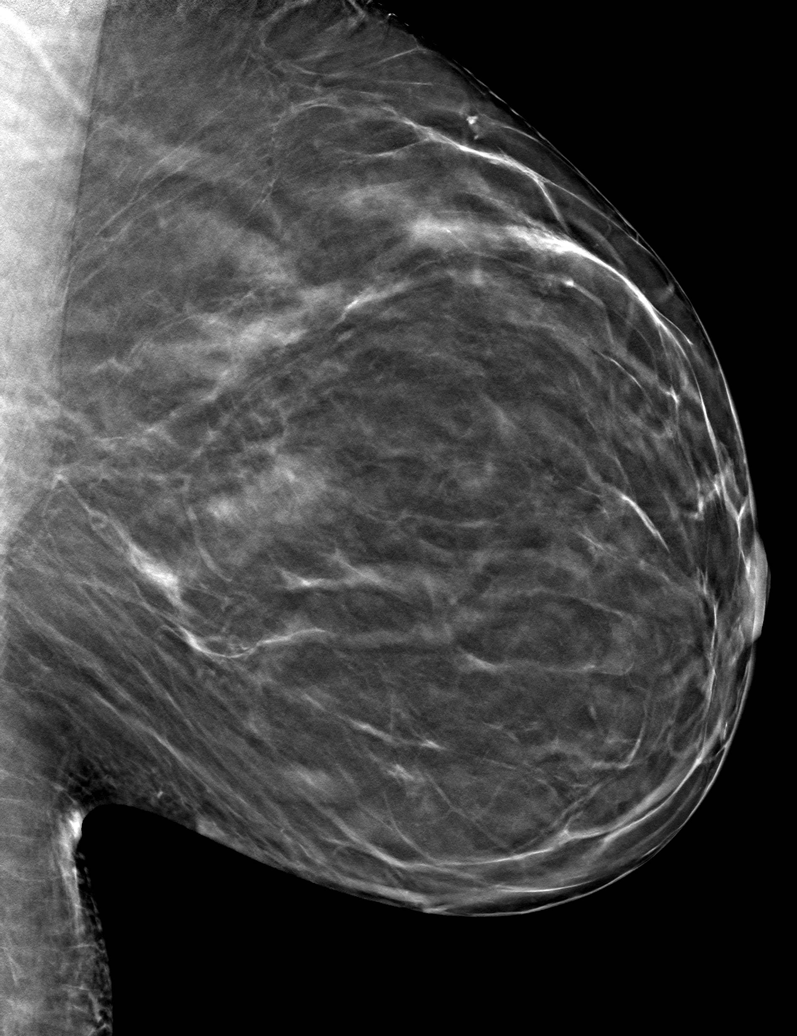

[L MLO tomo (1 of 2) · tomo slice 35/70.0]
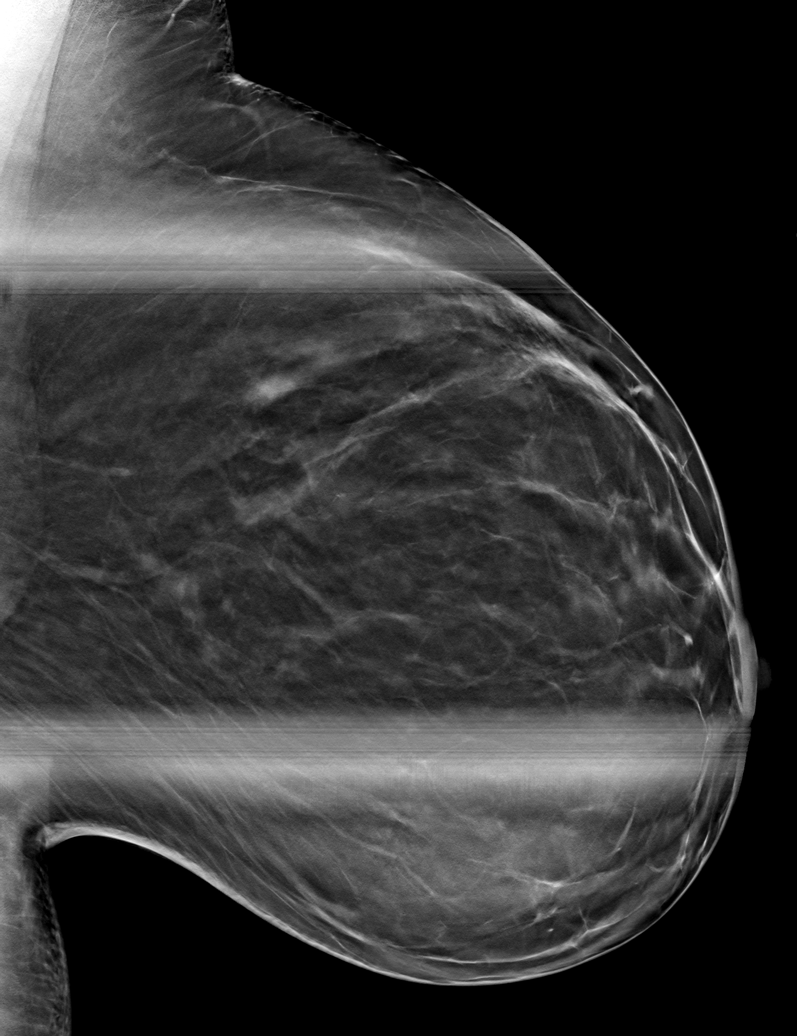

[L MLO tomo (2 of 2) · tomo slice 34/67.0]
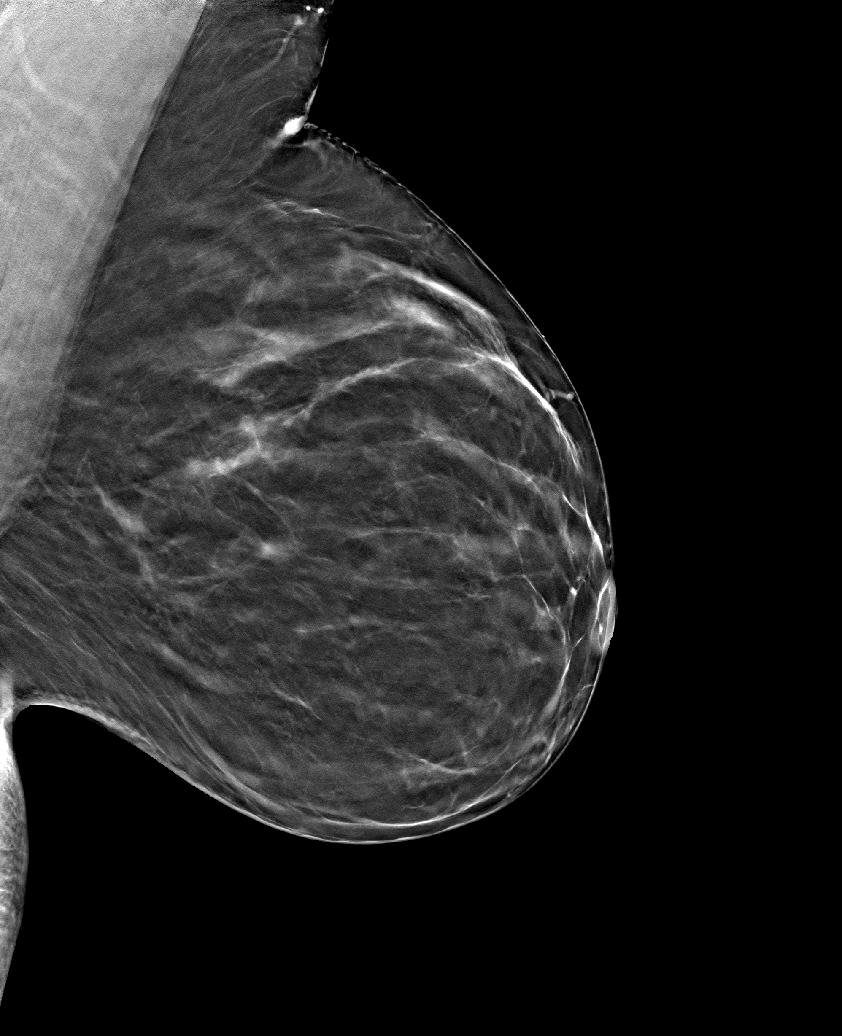

[6 of 18 positions shown; findings below may reference images not displayed]

ACR Breast Density Category b: There are scattered areas of
fibroglandular density.
FINDINGS: The asymmetry resolves on spot and repeat imaging today.

On physical exam, no suspicious lumps are identified.

Targeted ultrasound is performed, showing an island of tissue at 3
o'clock likely correlating with the asymmetry. No suspicious masses.
IMPRESSION: No mammographic or sonographic evidence of malignancy.

RECOMMENDATION:
Annual screening mammography.

I have discussed the findings and recommendations with the patient.
If applicable, a reminder letter will be sent to the patient
regarding the next appointment.

BI-RADS CATEGORY  2: Benign.

## 2023-10-31 ENCOUNTER — Encounter: Payer: Self-pay | Admitting: Physical Therapy

## 2023-10-31 ENCOUNTER — Ambulatory Visit: Payer: BC Managed Care – PPO | Admitting: Physical Therapy

## 2023-10-31 DIAGNOSIS — L905 Scar conditions and fibrosis of skin: Secondary | ICD-10-CM | POA: Diagnosis not present

## 2023-10-31 DIAGNOSIS — R252 Cramp and spasm: Secondary | ICD-10-CM | POA: Diagnosis not present

## 2023-10-31 DIAGNOSIS — R159 Full incontinence of feces: Secondary | ICD-10-CM | POA: Diagnosis not present

## 2023-10-31 DIAGNOSIS — M5459 Other low back pain: Secondary | ICD-10-CM

## 2023-10-31 DIAGNOSIS — R198 Other specified symptoms and signs involving the digestive system and abdomen: Secondary | ICD-10-CM | POA: Diagnosis not present

## 2023-10-31 DIAGNOSIS — R278 Other lack of coordination: Secondary | ICD-10-CM | POA: Diagnosis not present

## 2023-10-31 NOTE — Patient Instructions (Signed)

## 2023-10-31 NOTE — Therapy (Signed)
OUTPATIENT PHYSICAL THERAPY FEMALE PELVIC TREATMENT   Patient Name: Kelsey [name] MRN: [id number] DOB:[birth year], 48 y.o., female Today's Date: 10/31/2023  END OF SESSION:  PT End of Session - 10/31/23 1532     Visit Number 2    Date for PT Re-Evaluation 04/22/24    Authorization Type BCBS    PT Start Time 1530    PT Stop Time 1620    PT Time Calculation (min) 50 min    Activity Tolerance Patient tolerated treatment well    Behavior During Therapy [facility] for tasks assessed/performed             Past Medical History:  Diagnosis Date   Hypothyroidism    Pulmonary embolus (HCC) 11/17/2022   Past Surgical History:  Procedure Laterality Date   APPENDECTOMY     BREAST REDUCTION SURGERY     DILATION AND CURETTAGE OF UTERUS     LAPAROSCOPIC OVARIAN CYSTECTOMY     LIPOSUCTION     Tummy tuck N/A 2024   Patient Active Problem List   Diagnosis Date Noted   Diarrhea 10/11/2023   Palpitation 10/11/2023   Cholelithiasis 04/10/2023   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   Anemia 01/10/2023   Pulmonary embolism (HCC) 01/09/2023   Macromastia 12/28/2022   Paresthesia 09/07/2022   Obesity 02/10/2022   Anxiety 07/28/2021   Neck pain 07/28/2021   Nonintractable headache 07/28/2021   Insomnia 07/28/2021    PCP: Ardith Dark, MD  REFERRING PROVIDER: Doree Albee, PA-C   REFERRING DIAG:  R19.8 (ICD-10-CM) - Alternating constipation and diarrhea  R15.9 (ICD-10-CM) - Incontinence of feces, unspecified fecal incontinence type    THERAPY DIAG:  Cramp and spasm  Other low back pain  Scar  Other lack of coordination  Rationale for Evaluation and Treatment: Rehabilitation  ONSET DATE: 02/2022  SUBJECTIVE:                                                                                                                                                                                           SUBJECTIVE STATEMENT: I have been working around the massages around the  scar.   Fluid intake: Yes: water, coffee, tea    PAIN: 10/30/22 Are you having pain? Yes NPRS scale: 4/10 Pain location:  low back, pelvic and hips  Pain type: aching and sharp Pain description: constant   Aggravating factors: sitting still, laying in bed in the morning Relieving factors: as the day goes on  PRECAUTIONS: None  RED FLAGS: None   WEIGHT BEARING RESTRICTIONS: No  FALLS:  Has patient fallen in last 6 months? 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  PLOF: Independent  PATIENT GOALS: work on pain  PERTINENT HISTORY:  Hypothyroidism; Pulmonary embolus 11/17/2022; Appendectomy; Laparoscopic ovarian cystectomy;  Sexual abuse: No  BOWEL MOVEMENT: Pain with bowel movement: Yes, can be in abdomen 7-8/10 Type of bowel movement:Type (Bristol Stool Scale) Type 1, 4, 6, Frequency daily, Strain Yes, and Splinting no Fully empty rectum: No urgency with bowel movement Leakage: Yes: randomly when has diarrhea Pads: Yes:   Fiber supplement: Yes: Benefiber  URINATION: Pain with urination: No Fully empty bladder: Yes:   Stream: Strong Urgency: No Frequency: every 2 hours Leakage: Coughing, Sneezing, Laughing, Exercise, and Lifting; stopped going to the gym due to fear of leaking urine Pads: No  INTERCOURSE: Pain with intercourse:  none   PREGNANCY: Vaginal deliveries 4 Tearing No   PROLAPSE: None   OBJECTIVE:  Note: Objective measures were completed at Evaluation unless otherwise noted.  DIAGNOSTIC FINDINGS:  Colonoscopy negative    COGNITION: Overall cognitive status: Within functional limits for tasks assessed     SENSATION: Light touch: Deficits around the lower abdominal scar Proprioception: Appears intact  MUSCLE LENGTH: Thomas test: Right -35 deg; Left -10 deg    POSTURE: decreased lumbar  lordosis  PELVIC ALIGNMENT:left ilium is posteriorly rotated; when squatting she has pain in the left SI joint; decreased movement of the left SI joint; sacrum rotated left  LUMBARAROM/PROM:  A/PROM A/PROM  eval  Right rotation Decreased by 25%   (Blank rows = not tested)  LOWER EXTREMITY EAV:WUJW ROM of bilateral hips   LOWER EXTREMITY MMT:  MMT Right eval Left eval  Hip flexion 4/5 4/5  Hip extension 4/5 4/5  Hip abduction 3+/5 4/5  Hip adduction 4/5 4-/5   PALPATION:   General  bulges the lower abdomen when contracting, decreased mobility of the lower abdominal scar, able to expand the lower rib cage but difficulty with bringing it together.                 External Perineal intact                             Internal Pelvic Floor tightness along the sides of the introitus  Patient confirms identification and approves PT to assess internal pelvic floor and treatment Yes  PELVIC MMT:   MMT eval  Vaginal 3/5 ant and post. 2/5 on the sides, hold 3 s, quick flick but slow recruitment  Internal Anal Sphincter   External Anal Sphincter   Puborectalis   Diastasis Recti   (Blank rows = not tested)        TONE: average  PROLAPSE: none  TODAY'S TREATMENT:   10/31/23 Manual: Soft tissue mobilization: To assess for dry needling Manual work to the left lumbar paraspinals and quadratus to elongate after dry needling Scar tissue mobilization: Manual work to the scar on the lower abdomen and showed patient how to mobilize the tissue Myofascial release: Fascial work to the sides of the abdomen and suprapubic area to increase tissue mobility Spinal mobilization: Mobilization of the sacrum into extension in prone Trigger Point Dry Needling  Initial Treatment: Pt instructed on Dry Needling rational, procedures, and possible side effects. Pt instructed to expect mild to moderate muscle soreness later in the day and/or into the next day.  Pt instructed in methods to  reduce muscle  soreness. Pt instructed to continue prescribed HEP. Because Dry Needling was performed over or adjacent to a lung field, pt was educated on S/S of pneumothorax and to seek immediate medical attention should they occur.  Patient was educated on signs and symptoms of infection and other risk factors and advised to seek medical attention should they occur.  Patient verbalized understanding of these instructions and education.   Patient Verbal Consent Given: Yes Education Handout Provided: Yes Muscles Treated: left lumbar multifidi, left quadratus Electrical Stimulation Performed: No Treatment Response/Outcome: elongation of muscle and trigger point response Neuromuscular re-education: Down training: Diaphragmatic breathing in supine to open up the lower rib cage and abdomen Childs pose diaphragmatic breathing to lengthen the pelvic floor and back Pigeon pose with breathing to lengthen the muscles Cat cow to increase lumbar mobility Happy baby with diaphragmatic breathing to lengthen the pelvic floor                                                                                                                               DATE: 10/24/23  EVAL See below   PATIENT EDUCATION:  10/24/23 Education details: educated patient on scar massage;Access Code: [email or web address] Person educated: Patient Education method: Explanation, Demonstration, Tactile cues, Verbal cues, and Handouts Education comprehension: verbalized understanding, returned demonstration, verbal cues required, tactile cues required, and needs further education  HOME EXERCISE PROGRAM: 10/31/23 Access Code: [email or web address] URL: [email or web address] Date: 10/31/2023 Prepared by: Eulis Foster  Exercises - Cat Cow  - 1 x daily - 7 x weekly - 1 sets - 10 reps - Diaphragmatic Breathing in Child's Pose with Pelvic Floor Relaxation  - 1 x daily - 7 x weekly - 1 sets - 1 reps - 30 sec hold - Pigeon Pose  - 1 x daily - 7 x  weekly - 1 sets - 1 reps - 30 sec hold - Happy Baby with Pelvic Floor Lengthening  - 1 x daily - 7 x weekly - 1 sets - 1 reps - 30 sec hold   ASSESSMENT:  CLINICAL IMPRESSION: Patient is a 48 y.o. female who was seen today for physical therapy  treatment for fecal and urinary leakage.  Patient was able to perform more elongation of the abdomen for diaphragmatic breathing. She had less back pain but continues to have bilateral hip tightness. She has more difficulty with having a bowel movement when her hips are tighter. Patient will benefit from skilled therapy to reduce pain, improve strength to reduce leakage and improve quality of life.    OBJECTIVE IMPAIRMENTS: decreased coordination, decreased ROM, decreased strength, increased fascial restrictions, increased muscle spasms, and pain.   ACTIVITY LIMITATIONS: sitting, continence, toileting, and exercise  PARTICIPATION LIMITATIONS: driving, shopping, community activity, and occupation  PERSONAL FACTORS: Time since onset of injury/illness/exacerbation and 1-2 comorbidities: Hypothyroidism; Pulmonary embolus 11/17/2022; Appendectomy; Laparoscopic ovarian cystectomy;  are also affecting patient's functional outcome.   REHAB POTENTIAL: Excellent  CLINICAL DECISION MAKING: Evolving/moderate complexity  EVALUATION COMPLEXITY: Moderate   GOALS: Goals reviewed with patient? Yes  SHORT TERM GOALS: Target date: 11/21/23  Patient educated on scar mobility to improve sensation and elongation of tissue.  Baseline: Goal status: Met 10/31/23  2.  Patient is able to contract the pelvic floor in a full circle and strength >/= 3/5.  Baseline:  Goal status: INITIAL  3.  Patient independent with initial HEP for flexibility exercises.  Baseline:  Goal status: INITIAL  4.  Patient educated on urge to void with bowel movements.  Baseline:  Goal status: INITIAL  LONG TERM GOALS: Target date: 04/22/24  Patient independent with advanced HEP with core  and pelvic floor strength.  Baseline:  Goal status: INITIAL  2.  Patient is able to return to the gym with understanding how to exercise correctly to reduce strain on pelvic floor and urinary leakage decreased >/= 75%.  Baseline:  Goal status: INITIAL  3.  Patient is able to contract the upper and lower abdominals equally to reduce pressure on the pelvic floor and reduce urinary leakage >/= 75% with coughing and sneezing and laughing.  Baseline:  Goal status: INITIAL  4.  Patient lumbar pain decreased </= 1/10 due ot improved mobility and increased core strength.  Baseline:  Goal status: INITIAL   PLAN:  PT FREQUENCY: 1-2x/week  PT DURATION: 6 months  PLANNED INTERVENTIONS: 97110-Therapeutic exercises, 97530- Therapeutic activity, 97112- Neuromuscular re-education, 97535- Self Care, [id number]- Manual therapy, 97014- Electrical stimulation (unattended), 97035- Ultrasound, Patient/Family education, Taping, Dry Needling, Joint mobilization, Spinal mobilization, Scar mobilization, Cryotherapy, Moist heat, and Biofeedback  PLAN FOR NEXT SESSION: dry needling the gluteal to increase hip mobility, work on back and hip mobility, check pelvic alignment.    Eulis Foster, PT 10/31/23 4:43 PM

## 2023-11-07 ENCOUNTER — Encounter: Payer: Self-pay | Admitting: Physical Therapy

## 2023-11-07 ENCOUNTER — Ambulatory Visit: Payer: BC Managed Care – PPO | Admitting: Physical Therapy

## 2023-11-07 DIAGNOSIS — R198 Other specified symptoms and signs involving the digestive system and abdomen: Secondary | ICD-10-CM | POA: Diagnosis not present

## 2023-11-07 DIAGNOSIS — M5459 Other low back pain: Secondary | ICD-10-CM | POA: Diagnosis not present

## 2023-11-07 DIAGNOSIS — L905 Scar conditions and fibrosis of skin: Secondary | ICD-10-CM | POA: Diagnosis not present

## 2023-11-07 DIAGNOSIS — R278 Other lack of coordination: Secondary | ICD-10-CM | POA: Diagnosis not present

## 2023-11-07 DIAGNOSIS — R252 Cramp and spasm: Secondary | ICD-10-CM | POA: Diagnosis not present

## 2023-11-07 DIAGNOSIS — R159 Full incontinence of feces: Secondary | ICD-10-CM | POA: Diagnosis not present

## 2023-11-07 NOTE — Therapy (Signed)
OUTPATIENT PHYSICAL THERAPY FEMALE PELVIC TREATMENT   Patient Name: Kelsey [name] MRN: [id number] DOB:[birth year], 48 y.o., female Today's Date: 11/07/2023  END OF SESSION:  PT End of Session - 11/07/23 0937     Visit Number 3    Date for PT Re-Evaluation 04/22/24    Authorization Type BCBS    PT Start Time 0935    PT Stop Time 1015    PT Time Calculation (min) 40 min    Activity Tolerance Patient tolerated treatment well    Behavior During Therapy [facility] for tasks assessed/performed             Past Medical History:  Diagnosis Date   Hypothyroidism    Pulmonary embolus (HCC) 11/17/2022   Past Surgical History:  Procedure Laterality Date   APPENDECTOMY     BREAST REDUCTION SURGERY     DILATION AND CURETTAGE OF UTERUS     LAPAROSCOPIC OVARIAN CYSTECTOMY     LIPOSUCTION     Tummy tuck N/A 2024   Patient Active Problem List   Diagnosis Date Noted   Diarrhea 10/11/2023   Palpitation 10/11/2023   Cholelithiasis 04/10/2023   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   Anemia 01/10/2023   Pulmonary embolism (HCC) 01/09/2023   Macromastia 12/28/2022   Paresthesia 09/07/2022   Obesity 02/10/2022   Anxiety 07/28/2021   Neck pain 07/28/2021   Nonintractable headache 07/28/2021   Insomnia 07/28/2021    PCP: Ardith Dark, MD  REFERRING PROVIDER: Doree Albee, PA-C   REFERRING DIAG:  R19.8 (ICD-10-CM) - Alternating constipation and diarrhea  R15.9 (ICD-10-CM) - Incontinence of feces, unspecified fecal incontinence type    THERAPY DIAG:  Cramp and spasm  Other low back pain  Scar  Other lack of coordination  Rationale for Evaluation and Treatment: Rehabilitation  ONSET DATE: 02/2022  SUBJECTIVE:                                                                                                                                                                                           SUBJECTIVE STATEMENT: I am feeling relief with back. Laying on right side  and go to turn over I feel it triggers my left hip pain.   Fluid intake: Yes: water, coffee, tea    PAIN: 11/06/22 Are you having pain? Yes NPRS scale: 3/10 Pain location:  low back, pelvic and hips  Pain type: aching and sharp Pain description: constant   Aggravating factors: sitting still, laying in bed in the morning Relieving factors: as the day goes on  PRECAUTIONS: None  RED FLAGS: None   WEIGHT BEARING RESTRICTIONS: No  FALLS:  Has patient fallen in last 6 months? 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  PLOF: Independent  PATIENT GOALS: work on pain  PERTINENT HISTORY:  Hypothyroidism; Pulmonary embolus 11/17/2022; Appendectomy; Laparoscopic ovarian cystectomy;  Sexual abuse: No  BOWEL MOVEMENT: Pain with bowel movement: Yes, can be in abdomen 7-8/10 Type of bowel movement:Type (Bristol Stool Scale) Type 1, 4, 6, Frequency daily, Strain Yes, and Splinting no Fully empty rectum: No urgency with bowel movement Leakage: Yes: randomly when has diarrhea Pads: Yes:   Fiber supplement: Yes: Benefiber  URINATION: Pain with urination: No Fully empty bladder: Yes:   Stream: Strong Urgency: No Frequency: every 2 hours Leakage: Coughing, Sneezing, Laughing, Exercise, and Lifting; stopped going to the gym due to fear of leaking urine Pads: No  INTERCOURSE: Pain with intercourse:  none   PREGNANCY: Vaginal deliveries 4 Tearing No   PROLAPSE: None   OBJECTIVE:  Note: Objective measures were completed at Evaluation unless otherwise noted.  DIAGNOSTIC FINDINGS:  Colonoscopy negative    COGNITION: Overall cognitive status: Within functional limits for tasks assessed     SENSATION: Light touch: Deficits around the lower abdominal scar Proprioception: Appears intact  MUSCLE LENGTH: Thomas test: Right -35 deg; Left -10  deg    POSTURE: decreased lumbar lordosis  PELVIC ALIGNMENT:left ilium is posteriorly rotated; when squatting she has pain in the left SI joint; decreased movement of the left SI joint; sacrum rotated left  LUMBARAROM/PROM:  A/PROM A/PROM  eval  Right rotation Decreased by 25%   (Blank rows = not tested)  LOWER EXTREMITY ZOX:WRUE ROM of bilateral hips   LOWER EXTREMITY MMT:  MMT Right eval Left eval  Hip flexion 4/5 4/5  Hip extension 4/5 4/5  Hip abduction 3+/5 4/5  Hip adduction 4/5 4-/5   PALPATION:   General  bulges the lower abdomen when contracting, decreased mobility of the lower abdominal scar, able to expand the lower rib cage but difficulty with bringing it together.                 External Perineal intact                             Internal Pelvic Floor tightness along the sides of the introitus  Patient confirms identification and approves PT to assess internal pelvic floor and treatment Yes  PELVIC MMT:   MMT eval  Vaginal 3/5 ant and post. 2/5 on the sides, hold 3 s, quick flick but slow recruitment  Internal Anal Sphincter   External Anal Sphincter   Puborectalis   Diastasis Recti   (Blank rows = not tested)        TONE: average  PROLAPSE: none  TODAY'S TREATMENT:   11/07/23 Manual: Soft tissue mobilization: To assess for dry needling Manual work to the lumbar multifidi and gluteal and quadratus to elongate after dry needling Using the suction cup to the lumbar and gluteal area to release the fascia Spinal mobilization: Gapping of L1-L5 grade 3 laying on side to improve facet mobility Trigger Point Dry Needling Subsequent Treatment: Instructions reviewed, if requested by the patient, prior to subsequent dry needling treatment.   Patient Verbal Consent Given: Yes Education Handout Provided: Previously Provided Muscles Treated: bilateral multifidi, gluteus medius Electrical Stimulation Performed: No Treatment Response/Outcome:  elongation of muscle and trigger point  response Neuromuscular re-education: Form correction: Quadruped with one knee on the yoga block moving side to side and back and forth then doing thread the needle to open up the SI joints and improve the mobility of the lumbar Pelvic floor contraction training: Standing pelvic floor contraction to see if she feels it better since she has increased movement    10/31/23 Manual: Soft tissue mobilization: To assess for dry needling Manual work to the left lumbar paraspinals and quadratus to elongate after dry needling Scar tissue mobilization: Manual work to the scar on the lower abdomen and showed patient how to mobilize the tissue Myofascial release: Fascial work to the sides of the abdomen and suprapubic area to increase tissue mobility Spinal mobilization: Mobilization of the sacrum into extension in prone Trigger Point Dry Needling  Initial Treatment: Pt instructed on Dry Needling rational, procedures, and possible side effects. Pt instructed to expect mild to moderate muscle soreness later in the day and/or into the next day.  Pt instructed in methods to reduce muscle soreness. Pt instructed to continue prescribed HEP. Because Dry Needling was performed over or adjacent to a lung field, pt was educated on S/S of pneumothorax and to seek immediate medical attention should they occur.  Patient was educated on signs and symptoms of infection and other risk factors and advised to seek medical attention should they occur.  Patient verbalized understanding of these instructions and education.   Patient Verbal Consent Given: Yes Education Handout Provided: Yes Muscles Treated: left lumbar multifidi, left quadratus Electrical Stimulation Performed: No Treatment Response/Outcome: elongation of muscle and trigger point response Neuromuscular re-education: Down training: Diaphragmatic breathing in supine to open up the lower rib cage and  abdomen Childs pose diaphragmatic breathing to lengthen the pelvic floor and back Pigeon pose with breathing to lengthen the muscles Cat cow to increase lumbar mobility Happy baby with diaphragmatic breathing to lengthen the pelvic floor                                                                                                                               DATE: 10/24/23  EVAL See below   PATIENT EDUCATION:  11/07/23 Education details: educated patient on scar massage;Access Code: [email or web address] Person educated: Patient Education method: Explanation, Demonstration, Tactile cues, Verbal cues, and Handouts Education comprehension: verbalized understanding, returned demonstration, verbal cues required, tactile cues required, and needs further education  HOME EXERCISE PROGRAM: 11/07/23 Access Code: [email or web address] URL: [email or web address] Date: 11/07/2023 Prepared by: Eulis Foster  Exercises - Cat Cow  - 1 x daily - 7 x weekly - 1 sets - 10 reps - Diaphragmatic Breathing in Child's Pose with Pelvic Floor Relaxation  - 1 x daily - 7 x weekly - 1 sets - 1 reps - 30 sec hold - Pigeon Pose  - 1 x daily - 7 x weekly - 1 sets - 1 reps - 30 sec hold - Happy Baby with Pelvic Floor Lengthening  -  1 x daily - 7 x weekly - 1 sets - 1 reps - 30 sec hold - Quadruped Full Range Thoracic Rotation with Reach  - 1 x daily - 7 x weekly - 3 sets - 10 reps - Quadruped Yoga Block Lift Off  - 1 x daily - 7 x weekly - 3 sets - 10 reps  ASSESSMENT:  CLINICAL IMPRESSION: Patient is a 48 y.o. female who was seen today for physical therapy  treatment for fecal and urinary leakage.  Patient was able to roll in bed with less pain after the dry needling. She was able to feel the pelvic floor contraction in standing better.  Patient will benefit from skilled therapy to reduce pain, improve strength to reduce leakage and improve quality of life.    OBJECTIVE IMPAIRMENTS: decreased coordination, decreased ROM,  decreased strength, increased fascial restrictions, increased muscle spasms, and pain.   ACTIVITY LIMITATIONS: sitting, continence, toileting, and exercise  PARTICIPATION LIMITATIONS: driving, shopping, community activity, and occupation  PERSONAL FACTORS: Time since onset of injury/illness/exacerbation and 1-2 comorbidities: Hypothyroidism; Pulmonary embolus 11/17/2022; Appendectomy; Laparoscopic ovarian cystectomy;  are also affecting patient's functional outcome.   REHAB POTENTIAL: Excellent  CLINICAL DECISION MAKING: Evolving/moderate complexity  EVALUATION COMPLEXITY: Moderate   GOALS: Goals reviewed with patient? Yes  SHORT TERM GOALS: Target date: 11/21/23  Patient educated on scar mobility to improve sensation and elongation of tissue.  Baseline: Goal status: Met 10/31/23  2.  Patient is able to contract the pelvic floor in a full circle and strength >/= 3/5.  Baseline:  Goal status: INITIAL  3.  Patient independent with initial HEP for flexibility exercises.  Baseline:  Goal status: INITIAL  4.  Patient educated on urge to void with bowel movements.  Baseline:  Goal status: INITIAL  LONG TERM GOALS: Target date: 04/22/24  Patient independent with advanced HEP with core and pelvic floor strength.  Baseline:  Goal status: INITIAL  2.  Patient is able to return to the gym with understanding how to exercise correctly to reduce strain on pelvic floor and urinary leakage decreased >/= 75%.  Baseline:  Goal status: INITIAL  3.  Patient is able to contract the upper and lower abdominals equally to reduce pressure on the pelvic floor and reduce urinary leakage >/= 75% with coughing and sneezing and laughing.  Baseline:  Goal status: INITIAL  4.  Patient lumbar pain decreased </= 1/10 due ot improved mobility and increased core strength.  Baseline:  Goal status: INITIAL   PLAN:  PT FREQUENCY: 1-2x/week  PT DURATION: 6 months  PLANNED INTERVENTIONS:  97110-Therapeutic exercises, 97530- Therapeutic activity, [id number]- Neuromuscular re-education, 97535- Self Care, [id number]- Manual therapy, 97014- Electrical stimulation (unattended), 97035- Ultrasound, Patient/Family education, Taping, Dry Needling, Joint mobilization, Spinal mobilization, Scar mobilization, Cryotherapy, Moist heat, and Biofeedback  PLAN FOR NEXT SESSION: dry needling the gluteal to increase hip mobility,  check pelvic alignment. Manual work to the pelvic floor   Eulis Foster, PT 11/07/23 10:36 AM

## 2023-11-14 ENCOUNTER — Encounter: Payer: Self-pay | Admitting: Physical Therapy

## 2023-11-14 ENCOUNTER — Ambulatory Visit: Payer: BC Managed Care – PPO | Admitting: Physical Therapy

## 2023-11-14 DIAGNOSIS — L905 Scar conditions and fibrosis of skin: Secondary | ICD-10-CM

## 2023-11-14 DIAGNOSIS — R278 Other lack of coordination: Secondary | ICD-10-CM

## 2023-11-14 DIAGNOSIS — M5459 Other low back pain: Secondary | ICD-10-CM

## 2023-11-14 DIAGNOSIS — R198 Other specified symptoms and signs involving the digestive system and abdomen: Secondary | ICD-10-CM | POA: Diagnosis not present

## 2023-11-14 DIAGNOSIS — R252 Cramp and spasm: Secondary | ICD-10-CM | POA: Diagnosis not present

## 2023-11-14 DIAGNOSIS — R159 Full incontinence of feces: Secondary | ICD-10-CM | POA: Diagnosis not present

## 2023-11-14 NOTE — Therapy (Signed)
 OUTPATIENT PHYSICAL THERAPY FEMALE PELVIC TREATMENT   Patient Name: Kelsey [name] MRN: [id number] DOB:08-Dec-1975, 48 y.o., female Today's Date: 11/14/2023  END OF SESSION:  PT End of Session - 11/14/23 1109     Visit Number 4    Date for PT Re-Evaluation 04/22/24    Authorization Type [employer]    PT Start Time 1110   came late   PT Stop Time 1140    PT Time Calculation (min) 30 min    Activity Tolerance Patient tolerated treatment well    Behavior During Therapy [facility] for tasks assessed/performed             Past Medical History:  Diagnosis Date   Hypothyroidism    Pulmonary embolus (HCC) 11/17/2022   Past Surgical History:  Procedure Laterality Date   APPENDECTOMY     BREAST REDUCTION SURGERY     DILATION AND CURETTAGE OF UTERUS     LAPAROSCOPIC OVARIAN CYSTECTOMY     LIPOSUCTION     Tummy tuck N/A 2024   Patient Active Problem List   Diagnosis Date Noted   Diarrhea 10/11/2023   Palpitation 10/11/2023   Cholelithiasis 04/10/2023   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   Anemia 01/10/2023   Pulmonary embolism (HCC) 01/09/2023   Macromastia 12/28/2022   Paresthesia 09/07/2022   Obesity 02/10/2022   Anxiety 07/28/2021   Neck pain 07/28/2021   Nonintractable headache 07/28/2021   Insomnia 07/28/2021    PCP: Ardith Dark, MD  REFERRING PROVIDER: Doree Albee, PA-C   REFERRING DIAG:  R19.8 (ICD-10-CM) - Alternating constipation and diarrhea  R15.9 (ICD-10-CM) - Incontinence of feces, unspecified fecal incontinence type    THERAPY DIAG:  Cramp and spasm  Other low back pain  Scar  Other lack of coordination  Rationale for Evaluation and Treatment: Rehabilitation  ONSET DATE: 02/2022  SUBJECTIVE:                                                                                                                                                                                           SUBJECTIVE STATEMENT: I am having trouble with my left hip. Since  last visit. It started on Friday. I need to be on Benefiber to have a bowel movement.   Fluid intake: Yes: water, coffee, tea    PAIN: 11/13/22 Are you having pain? Yes NPRS scale: 6/10 Pain location:  low back, pelvic and hips  Pain type: aching and sharp Pain description: constant   Aggravating factors: sitting still, laying in bed in the morning Relieving factors: as the day goes on  PRECAUTIONS: None  RED FLAGS: None   WEIGHT  BEARING RESTRICTIONS: No  FALLS:  Has patient fallen in last 6 months? 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  PLOF: Independent  PATIENT GOALS: work on pain  PERTINENT HISTORY:  Hypothyroidism; Pulmonary embolus 11/17/2022; Appendectomy; Laparoscopic ovarian cystectomy;  Sexual abuse: No  BOWEL MOVEMENT: Pain with bowel movement: Yes, can be in abdomen 7-8/10 Type of bowel movement:Type (Bristol Stool Scale) Type 1, 4, 6, Frequency daily, Strain Yes, and Splinting no Fully empty rectum: No urgency with bowel movement Leakage: Yes: randomly when has diarrhea Pads: Yes:   Fiber supplement: Yes: Benefiber  URINATION: Pain with urination: No Fully empty bladder: Yes:   Stream: Strong Urgency: No Frequency: every 2 hours Leakage: Coughing, Sneezing, Laughing, Exercise, and Lifting; stopped going to the gym due to fear of leaking urine Pads: No  INTERCOURSE: Pain with intercourse:  none   PREGNANCY: Vaginal deliveries 4 Tearing No   PROLAPSE: None   OBJECTIVE:  Note: Objective measures were completed at Evaluation unless otherwise noted.  DIAGNOSTIC FINDINGS:  Colonoscopy negative    COGNITION: Overall cognitive status: Within functional limits for tasks assessed     SENSATION: Light touch: Deficits around the lower abdominal scar Proprioception: Appears intact  MUSCLE LENGTH: Thomas  test: Right -35 deg; Left -10 deg    POSTURE: decreased lumbar lordosis  PELVIC ALIGNMENT:left ilium is posteriorly rotated; when squatting she has pain in the left SI joint; decreased movement of the left SI joint; sacrum rotated left  LUMBARAROM/PROM:  A/PROM A/PROM  eval  Right rotation Decreased by 25%   (Blank rows = not tested)  LOWER EXTREMITY ZOX:WRUE ROM of bilateral hips   LOWER EXTREMITY MMT:  MMT Right eval Left eval  Hip flexion 4/5 4/5  Hip extension 4/5 4/5  Hip abduction 3+/5 4/5  Hip adduction 4/5 4-/5   PALPATION:   General  bulges the lower abdomen when contracting, decreased mobility of the lower abdominal scar, able to expand the lower rib cage but difficulty with bringing it together.                 External Perineal intact                             Internal Pelvic Floor tightness along the sides of the introitus  Patient confirms identification and approves PT to assess internal pelvic floor and treatment Yes  PELVIC MMT:   MMT eval 11/14/23  Vaginal 3/5 ant and post. 2/5 on the sides, hold 3 s, quick flick but slow recruitment 3/5 with circular hug of therapist index finger  Internal Anal Sphincter    External Anal Sphincter    Puborectalis    Diastasis Recti    (Blank rows = not tested)        TONE: average  PROLAPSE: none  TODAY'S TREATMENT:   11/14/23 Manual: Soft tissue mobilization: To assess for dry needling Manual work to the lumbar multifidi and quadratus to elongate after dry needling Internal pelvic floor techniques: No emotional/communication barriers or cognitive limitation. Patient is motivated to learn. Patient understands and agrees with treatment goals and plan. PT explains patient will be examined in standing, sitting, and lying down to see how their muscles and joints work. When they are ready, they will be asked to remove their underwear so PT can examine their  perineum. The patient is also given the option of  providing their own chaperone as one is not provided in our facility. The patient also has the right and is explained the right to defer or refuse any part of the evaluation or treatment including the internal exam. With the patient's consent, PT will use one gloved finger to gently assess the muscles of the pelvic floor, seeing how well it contracts and relaxes and if there is muscle symmetry. After, the patient will get dressed and PT and patient will discuss exam findings and plan of care. PT and patient discuss plan of care, schedule, attendance policy and HEP activities.  Going through the vaginal canal working on the side of the introitus and ischiocavernosus to elongate for circular contraction of the pelvic floor Trigger Point Dry Needling Subsequent Treatment: Instructions reviewed, if requested by the patient, prior to subsequent dry needling treatment.   Patient Verbal Consent Given: Yes Education Handout Provided: Previously Provided Muscles Treated: left multifidi Electrical Stimulation Performed: No Treatment Response/Outcome: elongation of muscle and trigger point response Neuromuscular re-education: Pelvic floor contraction training: Therapist finger in the vaginal canal to give tactile cues to perform a circular contraction, not bulge the pelvic floor or lower abdomen.     11/07/23 Manual: Soft tissue mobilization: To assess for dry needling Manual work to the lumbar multifidi and gluteal and quadratus to elongate after dry needling Using the suction cup to the lumbar and gluteal area to release the fascia Spinal mobilization: Gapping of L1-L5 grade 3 laying on side to improve facet mobility Trigger Point Dry Needling Subsequent Treatment: Instructions reviewed, if requested by the patient, prior to subsequent dry needling treatment.   Patient Verbal Consent Given: Yes Education Handout Provided: Previously Provided Muscles Treated: bilateral multifidi, gluteus  medius Electrical Stimulation Performed: No Treatment Response/Outcome: elongation of muscle and trigger point response Neuromuscular re-education: Form correction: Quadruped with one knee on the yoga block moving side to side and back and forth then doing thread the needle to open up the SI joints and improve the mobility of the lumbar Pelvic floor contraction training: Standing pelvic floor contraction to see if she feels it better since she has increased movement    10/31/23 Manual: Soft tissue mobilization: To assess for dry needling Manual work to the left lumbar paraspinals and quadratus to elongate after dry needling Scar tissue mobilization: Manual work to the scar on the lower abdomen and showed patient how to mobilize the tissue Myofascial release: Fascial work to the sides of the abdomen and suprapubic area to increase tissue mobility Spinal mobilization: Mobilization of the sacrum into extension in prone Trigger Point Dry Needling  Initial Treatment: Pt instructed on Dry Needling rational, procedures, and possible side effects. Pt instructed to expect mild to moderate muscle soreness later in the day and/or into the next day.  Pt instructed in methods to reduce muscle soreness. Pt instructed to continue prescribed HEP. Because Dry Needling was performed over or adjacent to a lung field, pt was educated on S/S of pneumothorax and to seek immediate medical attention should they occur.  Patient was educated on signs and symptoms of infection and other risk factors and advised to seek medical attention should they occur.  Patient verbalized understanding of these instructions and education.   Patient Verbal Consent Given: Yes Education Handout Provided: Yes Muscles Treated: left lumbar multifidi, left quadratus Electrical Stimulation Performed: No Treatment Response/Outcome: elongation of muscle and trigger point response Neuromuscular re-education: Down  training: Diaphragmatic  breathing in supine to open up the lower rib cage and abdomen Marjo Bicker pose diaphragmatic breathing to lengthen the pelvic floor and back Pigeon pose with breathing to lengthen the muscles Cat cow to increase lumbar mobility Happy baby with diaphragmatic breathing to lengthen the pelvic floor    PATIENT EDUCATION:  11/07/23 Education details: educated patient on scar massage;Access Code: [email or web address] Person educated: Patient Education method: Explanation, Demonstration, Tactile cues, Verbal cues, and Handouts Education comprehension: verbalized understanding, returned demonstration, verbal cues required, tactile cues required, and needs further education  HOME EXERCISE PROGRAM: 11/07/23 Access Code: [email or web address] URL: [email or web address] Date: 11/07/2023 Prepared by: Eulis Foster  Exercises - Cat Cow  - 1 x daily - 7 x weekly - 1 sets - 10 reps - Diaphragmatic Breathing in Child's Pose with Pelvic Floor Relaxation  - 1 x daily - 7 x weekly - 1 sets - 1 reps - 30 sec hold - Pigeon Pose  - 1 x daily - 7 x weekly - 1 sets - 1 reps - 30 sec hold - Happy Baby with Pelvic Floor Lengthening  - 1 x daily - 7 x weekly - 1 sets - 1 reps - 30 sec hold - Quadruped Full Range Thoracic Rotation with Reach  - 1 x daily - 7 x weekly - 3 sets - 10 reps - Quadruped Yoga Block Lift Off  - 1 x daily - 7 x weekly - 3 sets - 10 reps  ASSESSMENT:  CLINICAL IMPRESSION: Patient is a 48 y.o. female who was seen today for physical therapy  treatment for fecal and urinary leakage.  Patient is having a flare up with back and left leg pain over the weekend. The dry needling reduced the lumbar pain but not the left leg pain. Her pelvic floor strength increased to 3/5 with her understanding on how to not bulge her lower abdomen or pelvic floor.   Patient will benefit from skilled therapy to reduce pain, improve strength to reduce leakage and improve quality of life.    OBJECTIVE  IMPAIRMENTS: decreased coordination, decreased ROM, decreased strength, increased fascial restrictions, increased muscle spasms, and pain.   ACTIVITY LIMITATIONS: sitting, continence, toileting, and exercise  PARTICIPATION LIMITATIONS: driving, shopping, community activity, and occupation  PERSONAL FACTORS: Time since onset of injury/illness/exacerbation and 1-2 comorbidities: Hypothyroidism; Pulmonary embolus 11/17/2022; Appendectomy; Laparoscopic ovarian cystectomy;  are also affecting patient's functional outcome.   REHAB POTENTIAL: Excellent  CLINICAL DECISION MAKING: Evolving/moderate complexity  EVALUATION COMPLEXITY: Moderate   GOALS: Goals reviewed with patient? Yes  SHORT TERM GOALS: Target date: 11/21/23  Patient educated on scar mobility to improve sensation and elongation of tissue.  Baseline: Goal status: Met 10/31/23  2.  Patient is able to contract the pelvic floor in a full circle and strength >/= 3/5.  Baseline:  Goal status: Met 11/14/23  3.  Patient independent with initial HEP for flexibility exercises.  Baseline:  Goal status: INITIAL  4.  Patient educated on urge to void with bowel movements.  Baseline:  Goal status: INITIAL  LONG TERM GOALS: Target date: 04/22/24  Patient independent with advanced HEP with core and pelvic floor strength.  Baseline:  Goal status: INITIAL  2.  Patient is able to return to the gym with understanding how to exercise correctly to reduce strain on pelvic floor and urinary leakage decreased >/= 75%.  Baseline:  Goal status: INITIAL  3.  Patient is able to contract the upper and lower abdominals equally to reduce pressure on the pelvic floor  and reduce urinary leakage >/= 75% with coughing and sneezing and laughing.  Baseline:  Goal status: INITIAL  4.  Patient lumbar pain decreased </= 1/10 due ot improved mobility and increased core strength.  Baseline:  Goal status: INITIAL   PLAN:  PT FREQUENCY: 1-2x/week  PT  DURATION: 6 months  PLANNED INTERVENTIONS: 97110-Therapeutic exercises, 97530- Therapeutic activity, [id number]- Neuromuscular re-education, 97535- Self Care, [id number]- Manual therapy, [id number]- Electrical stimulation (unattended), 97035- Ultrasound, Patient/Family education, Taping, Dry Needling, Joint mobilization, Spinal mobilization, Scar mobilization, Cryotherapy, Moist heat, and Biofeedback  PLAN FOR NEXT SESSION: dry needling the gluteal to increase hip mobility,  check pelvic alignment. Manual work to the pelvic floor, urge to void   Eulis Foster, PT 11/14/23 1:24 PM

## 2023-11-21 ENCOUNTER — Encounter: Payer: Self-pay | Admitting: Physical Therapy

## 2023-11-21 ENCOUNTER — Encounter: Payer: Self-pay | Admitting: Sports Medicine

## 2023-11-21 ENCOUNTER — Ambulatory Visit: Payer: BC Managed Care – PPO | Attending: Physician Assistant | Admitting: Physical Therapy

## 2023-11-21 DIAGNOSIS — R252 Cramp and spasm: Secondary | ICD-10-CM | POA: Insufficient documentation

## 2023-11-21 DIAGNOSIS — R278 Other lack of coordination: Secondary | ICD-10-CM | POA: Diagnosis not present

## 2023-11-21 DIAGNOSIS — M5459 Other low back pain: Secondary | ICD-10-CM | POA: Insufficient documentation

## 2023-11-21 DIAGNOSIS — L905 Scar conditions and fibrosis of skin: Secondary | ICD-10-CM | POA: Insufficient documentation

## 2023-11-21 NOTE — Therapy (Signed)
 OUTPATIENT PHYSICAL THERAPY FEMALE PELVIC TREATMENT   Patient Name: Kelsey [name] MRN: [id number] DOB:Jun 20, 1976, 48 y.o., female Today's Date: 11/21/2023  END OF SESSION:  PT End of Session - 11/21/23 1111     Visit Number 5    Date for PT Re-Evaluation 04/22/24    Authorization Type [employer]    PT Start Time 1110    PT Stop Time 1145    PT Time Calculation (min) 35 min    Activity Tolerance Patient tolerated treatment well    Behavior During Therapy [facility] for tasks assessed/performed             Past Medical History:  Diagnosis Date   Hypothyroidism    Pulmonary embolus (HCC) 11/17/2022   Past Surgical History:  Procedure Laterality Date   APPENDECTOMY     BREAST REDUCTION SURGERY     DILATION AND CURETTAGE OF UTERUS     LAPAROSCOPIC OVARIAN CYSTECTOMY     LIPOSUCTION     Tummy tuck N/A 2024   Patient Active Problem List   Diagnosis Date Noted   Diarrhea 10/11/2023   Palpitation 10/11/2023   Cholelithiasis 04/10/2023   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   Anemia 01/10/2023   Pulmonary embolism (HCC) 01/09/2023   Macromastia 12/28/2022   Paresthesia 09/07/2022   Obesity 02/10/2022   Anxiety 07/28/2021   Neck pain 07/28/2021   Nonintractable headache 07/28/2021   Insomnia 07/28/2021    PCP: Ardith Dark, MD  REFERRING PROVIDER: Doree Albee, PA-C   REFERRING DIAG:  R19.8 (ICD-10-CM) - Alternating constipation and diarrhea  R15.9 (ICD-10-CM) - Incontinence of feces, unspecified fecal incontinence type    THERAPY DIAG:  Cramp and spasm  Other low back pain  Scar  Other lack of coordination  Rationale for Evaluation and Treatment: Rehabilitation  ONSET DATE: 02/2022  SUBJECTIVE:                                                                                                                                                                                           SUBJECTIVE STATEMENT: I am having surgery on my nipple on Friday. I have not  seen the doctor for my back yet. I have to take fiber to have a bowel movement. Urinary leakage continues.  Fluid intake: Yes: water, coffee, tea    PAIN: 11/13/22 Are you having pain? Yes NPRS scale: 5/10 Pain location:  low back, pelvic and hips  Pain type: aching and sharp Pain description: constant   Aggravating factors: sitting still, laying in bed in the morning Relieving factors: as the day goes on  PRECAUTIONS: None  RED FLAGS: None  WEIGHT BEARING RESTRICTIONS: No  FALLS:  Has patient fallen in last 6 months? 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  PLOF: Independent  PATIENT GOALS: work on pain  PERTINENT HISTORY:  Hypothyroidism; Pulmonary embolus 11/17/2022; Appendectomy; Laparoscopic ovarian cystectomy;  Sexual abuse: No  BOWEL MOVEMENT: Pain with bowel movement: Yes, can be in abdomen 7-8/10 Type of bowel movement:Type (Bristol Stool Scale) Type 1, 4, 6, Frequency daily, Strain Yes, and Splinting no Fully empty rectum: No urgency with bowel movement Leakage: Yes: randomly when has diarrhea Pads: Yes:   Fiber supplement: Yes: Benefiber  URINATION: Pain with urination: No Fully empty bladder: Yes:   Stream: Strong Urgency: No Frequency: every 2 hours Leakage: Coughing, Sneezing, Laughing, Exercise, and Lifting; stopped going to the gym due to fear of leaking urine Pads: No  INTERCOURSE: Pain with intercourse:  none   PREGNANCY: Vaginal deliveries 4 Tearing No   PROLAPSE: None   OBJECTIVE:  Note: Objective measures were completed at Evaluation unless otherwise noted.  DIAGNOSTIC FINDINGS:  Colonoscopy negative    COGNITION: Overall cognitive status: Within functional limits for tasks assessed     SENSATION: Light touch: Deficits around the lower abdominal scar Proprioception: Appears intact  MUSCLE  LENGTH: Thomas test: Right -35 deg; Left -10 deg    POSTURE: decreased lumbar lordosis  PELVIC ALIGNMENT:left ilium is posteriorly rotated; when squatting she has pain in the left SI joint; decreased movement of the left SI joint; sacrum rotated left  LUMBARAROM/PROM:  A/PROM A/PROM  eval  Right rotation Decreased by 25%   (Blank rows = not tested)  LOWER EXTREMITY MWU:XLKG ROM of bilateral hips   LOWER EXTREMITY MMT:  MMT Right eval Left eval  Hip flexion 4/5 4/5  Hip extension 4/5 4/5  Hip abduction 3+/5 4/5  Hip adduction 4/5 4-/5   PALPATION:   General  bulges the lower abdomen when contracting, decreased mobility of the lower abdominal scar, able to expand the lower rib cage but difficulty with bringing it together.                 External Perineal intact                             Internal Pelvic Floor tightness along the sides of the introitus  Patient confirms identification and approves PT to assess internal pelvic floor and treatment Yes  PELVIC MMT:   MMT eval 11/14/23  Vaginal 3/5 ant and post. 2/5 on the sides, hold 3 s, quick flick but slow recruitment 3/5 with circular hug of therapist index finger  Internal Anal Sphincter    External Anal Sphincter    Puborectalis    Diastasis Recti    (Blank rows = not tested)        TONE: average  PROLAPSE: none  TODAY'S TREATMENT:   11/21/23 Manual: Soft tissue mobilization: To assess for dry needling Manual work to the lumbar multifidi and quadratus to elongate after dry needling Manual work to the pelvic floor externally through the patients clothes Spinal mobilization: PA and rotation grade 3 to L1 to L5  Trigger Point Dry Needling Subsequent Treatment: Instructions reviewed, if requested by the patient, prior to subsequent dry needling treatment.   Patient Verbal Consent Given: Yes Education Handout Provided: Previously Provided Muscles Treated: left multifidi Electrical Stimulation  Performed: No Treatment Response/Outcome: elongation of muscle and trigger point response Neuromuscular re-education: Core facilitation: Pallof in standing 10 x each way with pelvic floor contraction Standing bilateral shoulder extension with pelvic floor contraction    11/14/23 Manual: Soft tissue mobilization: To assess for dry needling Manual work to the lumbar multifidi and quadratus to elongate after dry needling Internal pelvic floor techniques: No emotional/communication barriers or cognitive limitation. Patient is motivated to learn. Patient understands and agrees with treatment goals and plan. PT explains patient will be examined in standing, sitting, and lying down to see how their muscles and joints work. When they are ready, they will be asked to remove their underwear so PT can examine their perineum. The patient is also given the option of providing their own chaperone as one is not provided in our facility. The patient also has the right and is explained the right to defer or refuse any part of the evaluation or treatment including the internal exam. With the patient's consent, PT will use one gloved finger to gently assess the muscles of the pelvic floor, seeing how well it contracts and relaxes and if there is muscle symmetry. After, the patient will get dressed and PT and patient will discuss exam findings and plan of care. PT and patient discuss plan of care, schedule, attendance policy and HEP activities.  Going through the vaginal canal working on the side of the introitus and ischiocavernosus to elongate for circular contraction of the pelvic floor Trigger Point Dry Needling Subsequent Treatment: Instructions reviewed, if requested by the patient, prior to subsequent dry needling treatment.   Patient Verbal Consent Given: Yes Education Handout Provided: Previously Provided Muscles Treated: left multifidi Electrical Stimulation Performed: No Treatment Response/Outcome:  elongation of muscle and trigger point response Neuromuscular re-education: Pelvic floor contraction training: Therapist finger in the vaginal canal to give tactile cues to perform a circular contraction, not bulge the pelvic floor or lower abdomen.     11/07/23 Manual: Soft tissue mobilization: To assess for dry needling Manual work to the lumbar multifidi and gluteal and quadratus to elongate after dry needling Using the suction cup to the lumbar and gluteal area to release the fascia Spinal mobilization: Gapping of L1-L5 grade 3 laying on side to improve facet mobility Trigger Point Dry Needling Subsequent Treatment: Instructions reviewed, if requested by the patient, prior to subsequent dry needling treatment.   Patient Verbal Consent Given: Yes Education Handout Provided: Previously Provided Muscles Treated: bilateral multifidi, gluteus medius Electrical Stimulation Performed: No Treatment Response/Outcome: elongation of muscle and trigger point response Neuromuscular re-education: Form correction: Quadruped with one knee on the yoga block moving side to side and back and forth then doing thread the needle to open up the SI joints and improve the mobility of the lumbar Pelvic floor contraction training: Standing pelvic floor contraction to see if she feels it better since she has increased movement       PATIENT EDUCATION:  11/07/23 Education details: educated patient on scar massage;Access Code: [email or web address] Person educated: Patient Education method: Explanation, Demonstration, Tactile cues, Verbal cues, and Handouts Education comprehension: verbalized understanding, returned demonstration, verbal cues required, tactile cues required, and needs further education  HOME EXERCISE PROGRAM: 11/07/23 Access Code: [email or web address] URL: [email or web address] Date: 11/07/2023 Prepared by: Eulis Foster  Exercises - Cat Cow  - 1 x daily - 7 x weekly - 1 sets - 10 reps -  Diaphragmatic Breathing in Child's Pose with Pelvic Floor Relaxation  - 1 x daily - 7  x weekly - 1 sets - 1 reps - 30 sec hold - Pigeon Pose  - 1 x daily - 7 x weekly - 1 sets - 1 reps - 30 sec hold - Happy Baby with Pelvic Floor Lengthening  - 1 x daily - 7 x weekly - 1 sets - 1 reps - 30 sec hold - Quadruped Full Range Thoracic Rotation with Reach  - 1 x daily - 7 x weekly - 3 sets - 10 reps - Quadruped Yoga Block Lift Off  - 1 x daily - 7 x weekly - 3 sets - 10 reps  ASSESSMENT:  CLINICAL IMPRESSION: Patient is a 48 y.o. female who was seen today for physical therapy  treatment for fecal and urinary leakage.  Patient pelvic floor muscles externally feel supple. She is learning how to contract her pelvic floor in standing. She is limping a little due to her back pain. Patient continues to do her HEP. She leaks urine in standing.   Patient will benefit from skilled therapy to reduce pain, improve strength to reduce leakage and improve quality of life.    OBJECTIVE IMPAIRMENTS: decreased coordination, decreased ROM, decreased strength, increased fascial restrictions, increased muscle spasms, and pain.   ACTIVITY LIMITATIONS: sitting, continence, toileting, and exercise  PARTICIPATION LIMITATIONS: driving, shopping, community activity, and occupation  PERSONAL FACTORS: Time since onset of injury/illness/exacerbation and 1-2 comorbidities: Hypothyroidism; Pulmonary embolus 11/17/2022; Appendectomy; Laparoscopic ovarian cystectomy;  are also affecting patient's functional outcome.   REHAB POTENTIAL: Excellent  CLINICAL DECISION MAKING: Evolving/moderate complexity  EVALUATION COMPLEXITY: Moderate   GOALS: Goals reviewed with patient? Yes  SHORT TERM GOALS: Target date: 11/21/23  Patient educated on scar mobility to improve sensation and elongation of tissue.  Baseline: Goal status: Met 10/31/23  2.  Patient is able to contract the pelvic floor in a full circle and strength >/= 3/5.   Baseline:  Goal status: Met 11/14/23  3.  Patient independent with initial HEP for flexibility exercises.  Baseline:  Goal status: Met 11/21/23  4.  Patient educated on urge to void with bowel movements.  Baseline:  Goal status: INITIAL  LONG TERM GOALS: Target date: 04/22/24  Patient independent with advanced HEP with core and pelvic floor strength.  Baseline:  Goal status: INITIAL  2.  Patient is able to return to the gym with understanding how to exercise correctly to reduce strain on pelvic floor and urinary leakage decreased >/= 75%.  Baseline:  Goal status: INITIAL  3.  Patient is able to contract the upper and lower abdominals equally to reduce pressure on the pelvic floor and reduce urinary leakage >/= 75% with coughing and sneezing and laughing.  Baseline:  Goal status: INITIAL  4.  Patient lumbar pain decreased </= 1/10 due ot improved mobility and increased core strength.  Baseline:  Goal status: INITIAL   PLAN:  PT FREQUENCY: 1-2x/week  PT DURATION: 6 months  PLANNED INTERVENTIONS: 97110-Therapeutic exercises, 97530- Therapeutic activity, [id number]- Neuromuscular re-education, 97535- Self Care, 29562- Manual therapy, 97014- Electrical stimulation (unattended), 97035- Ultrasound, Patient/Family education, Taping, Dry Needling, Joint mobilization, Spinal mobilization, Scar mobilization, Cryotherapy, Moist heat, and Biofeedback  PLAN FOR NEXT SESSION: dry needling the gluteal to increase hip mobility,  check pelvic alignment. Manual work to the pelvic floor, urge to void, exercise in standing   Eulis Foster, PT 11/21/23 11:46 AM

## 2023-11-27 ENCOUNTER — Ambulatory Visit: Admitting: Sports Medicine

## 2023-11-27 VITALS — BP 110/70 | HR 68 | Ht 62.0 in | Wt 165.8 lb

## 2023-11-27 DIAGNOSIS — M533 Sacrococcygeal disorders, not elsewhere classified: Secondary | ICD-10-CM

## 2023-11-27 DIAGNOSIS — G8929 Other chronic pain: Secondary | ICD-10-CM

## 2023-11-27 DIAGNOSIS — M25552 Pain in left hip: Secondary | ICD-10-CM

## 2023-11-27 DIAGNOSIS — M7062 Trochanteric bursitis, left hip: Secondary | ICD-10-CM | POA: Diagnosis not present

## 2023-11-27 MED ORDER — METHYLPREDNISOLONE 4 MG PO TBPK: ORAL_TABLET | ORAL | 0 refills | Status: DC

## 2023-11-27 NOTE — Patient Instructions (Signed)
 Prednisone dos pak  Glute HEP  2 week follow up

## 2023-11-27 NOTE — Progress Notes (Deleted)
    Kelsey [name] D.[name] [address] [place] [address] Phone: [phone]   Assessment and Plan:     There are no diagnoses linked to this encounter.  ***   Pertinent previous records reviewed include ***    Follow Up: ***     Subjective:   I, Kelsey [name], am serving as a Neurosurgeon for Doctor Richardean Sale     Chief Complaint: neck and shoulder pain    HPI:    09/26/2022 Patient is a 48 year old female complaining of neck and shoulder pain. Patient states has been going on for months , she has flares out of no where, neck and shoulders tighten and dont let go, thinks it might be stress related, sometimes she does have numbness and tingling down to her fingers, no meds for the pain , upper trap pain, at rest she has a little pain , decreased ROM due to pain notes she felt a crack yesterday that was painful and then it felt good    10/17/2022 Patient states that she is okay    11/07/2022 Patient states she is okay    12/05/2022 Patient states she is good ,better    12/28/2022 Patient states moderate management of neck and upper back sx, continues to have periscapular pain. Notes flare-up of lower back pain, bilateral. Pain at posterior aspect of the hips/SI joint. Denies sharp shooting pain, n/t, weakness in B LE. Sx x 4 days. Has tried HEP and TheraGun. Scheduled for surgery on Monday.    10/16/2023 Patient states low back , hip mostly left hip pain flare . She is not able to get out of bed . She was not able to get out of car today. Was seen by chiro and that helped some . Is interested in OMT  11/28/2023 Patient states   Relevant Historical Information: None pertinent  Additional pertinent review of systems negative.   Current Outpatient Medications:    EPINEPHrine 0.3 mg/0.3 mL IJ SOAJ injection, Inject 0.3 mg into the muscle as needed for anaphylaxis., Disp: 1 each, Rfl: 0   escitalopram (LEXAPRO) 10 MG tablet, Take 10  mg by mouth daily., Disp: , Rfl:    meloxicam (MOBIC) 15 MG tablet, Take 1 tablet (15 mg total) by mouth daily., Disp: 30 tablet, Rfl: 0   minoxidil (LONITEN) 2.5 MG tablet, Take 2.5 mg by mouth daily. PATIENT TAKES 1/2 TABLET DAILY, Disp: , Rfl:    tirzepatide (MOUNJARO) 5 MG/0.5ML Pen, Inject 5 mg into the skin once a week., Disp: , Rfl:    Objective:     There were no vitals filed for this visit.    There is no height or weight on file to calculate BMI.    Physical Exam:    ***   Electronically signed by:  Kelsey [name] [name] 7:44 AM 11/27/23

## 2023-11-27 NOTE — Progress Notes (Signed)
 Kelsey [name] [name] [address] [place] [address] Phone: [phone]   Assessment and Plan:     1. Left hip pain 2. Greater trochanteric bursitis of left hip 3. Chronic left SI joint pain -Chronic with exacerbation, subsequent visit - Patient has had improvement in low back symptoms since starting HEP, physical therapy, completing meloxicam course, however patient continues to have left posterior hip pain, left lateral hip pain.  Pains at today's visit are more consistent with greater trochanteric bursitis, gluteal muscle strain, left SI joint pain - Discontinue meloxicam 15 mg daily.  May continue to use meloxicam 15 mg daily as needed for pain relief.  Recommend limiting chronic NSAIDs to 1-2 doses per week - Start prednisone Dosepak -Continue HEP and physical therapy.  Start new HEP for gluteal musculature  15 additional minutes spent for educating Therapeutic Home Exercise Program.  This included exercises focusing on stretching, strengthening, with focus on eccentric aspects.   Long term goals include an improvement in range of motion, strength, endurance as well as avoiding reinjury. Patient's frequency would include in 1-2 times a day, 3-5 times a week for a duration of 6-12 weeks. Proper technique shown and discussed handout in great detail with ATC.  All questions were discussed and answered.    Pertinent previous records reviewed include physical note 11/21/2023  Follow Up: 2 to 3 weeks for reevaluation.  Could consider x-ray of hip versus SI joint and CSI of greater trochanter versus gluteal musculature versus SI joint based on areas of remaining pain   Subjective:   I, Kelsey [name] am a scribe for Dr. Jean [name].      Chief Complaint: neck and shoulder pain    HPI:    09/26/2022 Patient is a 48 year old female complaining of neck and shoulder pain. Patient states has been going on for months , she has flares out of no where,  neck and shoulders tighten and dont let go, thinks it might be stress related, sometimes she does have numbness and tingling down to her fingers, no meds for the pain , upper trap pain, at rest she has a little pain , decreased ROM due to pain notes she felt a crack yesterday that was painful and then it felt good    10/17/2022 Patient states that she is okay    11/07/2022 Patient states she is okay    12/05/2022 Patient states she is good ,better    12/28/2022 Patient states moderate management of neck and upper back sx, continues to have periscapular pain. Notes flare-up of lower back pain, bilateral. Pain at posterior aspect of the hips/SI joint. Denies sharp shooting pain, n/t, weakness in B LE. Sx x 4 days. Has tried HEP and TheraGun. Scheduled for surgery on Monday.    10/16/2023 Patient states low back , hip mostly left hip pain flare . She is not able to get out of bed . She was not able to get out of car today. Was seen by chiro and that helped some . Is interested in OMT  11/27/2023 Patient states took meloxicam today because the hip has been hurting. Pain is noticeable but not terrible. Pain has been consistent even with massage therapy and dry needling.    Relevant Historical Information: None pertinent  Additional pertinent review of systems negative.   Current Outpatient Medications:    EPINEPHrine 0.3 mg/0.3 mL IJ SOAJ injection, Inject 0.3 mg into the muscle as needed for anaphylaxis., Disp:  1 each, Rfl: 0   escitalopram (LEXAPRO) 10 MG tablet, Take 10 mg by mouth daily., Disp: , Rfl:    meloxicam (MOBIC) 15 MG tablet, Take 1 tablet (15 mg total) by mouth daily., Disp: 30 tablet, Rfl: 0   methylPREDNISolone (MEDROL DOSEPAK) 4 MG TBPK tablet, Take 6 tablets on day 1.  Take 5 tablets on day 2.  Take 4 tablets on day 3.  Take 3 tablets on day 4.  Take 2 tablets on day 5.  Take 1 tablet on day 6., Disp: 21 tablet, Rfl: 0   minoxidil (LONITEN) 2.5 MG tablet, Take 2.5 mg by mouth  daily. PATIENT TAKES 1/2 TABLET DAILY, Disp: , Rfl:    tirzepatide (MOUNJARO) 5 MG/0.5ML Pen, Inject 5 mg into the skin once a week., Disp: , Rfl:    Objective:     Vitals:   11/27/23 1020  BP: 110/70  Pulse: 68  SpO2: 98%  Weight: 165 lb 12.8 oz (75.2 kg)  Height: 5\' 2"  (1.575 m)      Body mass index is 30.33 kg/m.    Physical Exam:    General: awake, alert, and oriented no acute distress, nontoxic Skin: no suspicious lesions or rashes Neuro:sensation intact distally with no deficits, normal muscle tone, no atrophy, strength 5/5 in all tested lower ext groups Psych: normal mood and affect, speech clear   Left hip: No deformity, swelling or wasting ROM Flexion 90, ext 30, IR 45, ER 45 TTP gluteal musculature, greater trochanter, IT band, left SI joint with positive sphinx NTTP over the hip flexors,  lumbar spine Negative log roll with FROM Negative FABER Negative FADIR Negative Piriformis test, the reproduced tight stretching sensation on the left that was not present on right Negative trendelenberg Gait normal    Electronically signed by:  Kelsey [name] [name] 11:26 AM 11/27/23

## 2023-11-28 ENCOUNTER — Ambulatory Visit: Admitting: Sports Medicine

## 2023-12-10 NOTE — Progress Notes (Deleted)
    Kelsey [name] D.[name] [address] [place] [address] Phone: [phone]   Assessment and Plan:     There are no diagnoses linked to this encounter.  ***   Pertinent previous records reviewed include ***    Follow Up: ***     Subjective:   I, Kelsey [name], am serving as a Neurosurgeon for Doctor Richardean Sale  Chief Complaint: neck and shoulder pain    HPI:    09/26/2022 Patient is a 48 year old female complaining of neck and shoulder pain. Patient states has been going on for months , she has flares out of no where, neck and shoulders tighten and dont let go, thinks it might be stress related, sometimes she does have numbness and tingling down to her fingers, no meds for the pain , upper trap pain, at rest she has a little pain , decreased ROM due to pain notes she felt a crack yesterday that was painful and then it felt good    10/17/2022 Patient states that she is okay    11/07/2022 Patient states she is okay    12/05/2022 Patient states she is good ,better    12/28/2022 Patient states moderate management of neck and upper back sx, continues to have periscapular pain. Notes flare-up of lower back pain, bilateral. Pain at posterior aspect of the hips/SI joint. Denies sharp shooting pain, n/t, weakness in B LE. Sx x 4 days. Has tried HEP and TheraGun. Scheduled for surgery on Monday.    10/16/2023 Patient states low back , hip mostly left hip pain flare . She is not able to get out of bed . She was not able to get out of car today. Was seen by chiro and that helped some . Is interested in OMT   11/27/2023 Patient states took meloxicam today because the hip has been hurting. Pain is noticeable but not terrible. Pain has been consistent even with massage therapy and dry needling.   12/11/2023 Patient states   Relevant Historical Information: None pertinent Additional pertinent review of systems negative.   Current Outpatient  Medications:    EPINEPHrine 0.3 mg/0.3 mL IJ SOAJ injection, Inject 0.3 mg into the muscle as needed for anaphylaxis., Disp: 1 each, Rfl: 0   escitalopram (LEXAPRO) 10 MG tablet, Take 10 mg by mouth daily., Disp: , Rfl:    meloxicam (MOBIC) 15 MG tablet, Take 1 tablet (15 mg total) by mouth daily., Disp: 30 tablet, Rfl: 0   methylPREDNISolone (MEDROL DOSEPAK) 4 MG TBPK tablet, Take 6 tablets on day 1.  Take 5 tablets on day 2.  Take 4 tablets on day 3.  Take 3 tablets on day 4.  Take 2 tablets on day 5.  Take 1 tablet on day 6., Disp: 21 tablet, Rfl: 0   minoxidil (LONITEN) 2.5 MG tablet, Take 2.5 mg by mouth daily. PATIENT TAKES 1/2 TABLET DAILY, Disp: , Rfl:    tirzepatide (MOUNJARO) 5 MG/0.5ML Pen, Inject 5 mg into the skin once a week., Disp: , Rfl:    Objective:     There were no vitals filed for this visit.    There is no height or weight on file to calculate BMI.    Physical Exam:    ***   Electronically signed by:  Kelsey [name] [name] 12:50 PM 12/10/23

## 2023-12-11 ENCOUNTER — Ambulatory Visit: Admitting: Sports Medicine

## 2023-12-12 ENCOUNTER — Other Ambulatory Visit: Payer: Self-pay

## 2023-12-12 ENCOUNTER — Ambulatory Visit: Admitting: Sports Medicine

## 2023-12-12 ENCOUNTER — Ambulatory Visit (INDEPENDENT_AMBULATORY_CARE_PROVIDER_SITE_OTHER)

## 2023-12-12 VITALS — BP 102/60 | HR 75 | Ht 62.0 in | Wt 165.6 lb

## 2023-12-12 DIAGNOSIS — M160 Bilateral primary osteoarthritis of hip: Secondary | ICD-10-CM | POA: Diagnosis not present

## 2023-12-12 DIAGNOSIS — M533 Sacrococcygeal disorders, not elsewhere classified: Secondary | ICD-10-CM

## 2023-12-12 DIAGNOSIS — G8929 Other chronic pain: Secondary | ICD-10-CM

## 2023-12-12 DIAGNOSIS — M25552 Pain in left hip: Secondary | ICD-10-CM

## 2023-12-12 DIAGNOSIS — M7062 Trochanteric bursitis, left hip: Secondary | ICD-10-CM | POA: Diagnosis not present

## 2023-12-12 NOTE — Progress Notes (Signed)
 Kelsey [name] D.[name] [address] [place] [address] Phone: [phone]   Assessment and Plan:     1. Left hip pain (Primary) 2. Chronic left SI joint pain -Chronic with exacerbation, subsequent visit - Most consistent with SI joint degenerative changes causing flare of pain.  Symptoms that were present on prior exam along greater trochanter and gluteal musculature are not present on physical exam today, so I believe prednisone and targeted physical therapy has decreased symptoms over this region. SI joint Pain significantly improved while on prednisone Dosepak, however pain returned after completing Dosepak - Continue HEP and physical therapy - Patient elected for CSI of left SI joint.  Tolerated well per note below -X-ray obtained in clinic.  My interpretation: No acute fracture or dislocation.  Sclerosis and decreased space of left SI joint compared to right.  Cortical irregularity at right acetabulum that appears chronic in nature and does not coincide with current symptoms   Procedure: Ultrasound Guided Sacroiliac Joint Injection  Side: Left Diagnosis: SI joint pain [place] Indication:  - accuracy is paramount for diagnosis - to ensure therapeutic efficacy or procedural success - to reduce procedural risk  After explaining the procedure, viable alternatives, risks, and answering any questions, consent was given verbally. The site was cleaned with chlorhexidine prep. An ultrasound transducer was placed on the lumbosacral spine.  The spinous processes were identified. These were followed down from the lumbar spine to the scarum and the SI joint was identified as were the neural foramen.  A needle was introduced with care taken to avoid the neural foramen under ultrasound guidance into the SI joint with sterile technique.    A steroid injection was performed using 2ml of 1% lidocaine without epinephrine and 40 mg of triamcinolone (KENALOG)  40mg /ml. This was well tolerated and resulted in  relief.  Needle was removed and dressing placed and post injection instructions were given including  a discussion of likely return of pain today after the anesthetic wears off (with the possibility of worsened pain) until the steroid starts to work in 1-3 days.   Pt was advised to call or return to clinic if these symptoms worsen or fail to improve as anticipated.   Pertinent previous records reviewed include none  Follow Up: 4 weeks for reevaluation.  Could consider advanced imaging versus repeating OMT   Subjective:   I, [name] am a scribe for  Dr. [name].    Chief Complaint: neck and shoulder pain    HPI:    09/26/2022 Patient is a 48 year old female complaining of neck and shoulder pain. Patient states has been going on for months , she has flares out of no where, neck and shoulders tighten and dont let go, thinks it might be stress related, sometimes she does have numbness and tingling down to her fingers, no meds for the pain , upper trap pain, at rest she has a little pain , decreased ROM due to pain notes she felt a crack yesterday that was painful and then it felt good    10/17/2022 Patient states that she is okay    11/07/2022 Patient states she is okay    12/05/2022 Patient states she is good ,better    12/28/2022 Patient states moderate management of neck and upper back sx, continues to have periscapular pain. Notes flare-up of lower back pain, bilateral. Pain at posterior aspect of the hips/SI joint. Denies sharp shooting pain, n/t, weakness in B  LE. Sx x 4 days. Has tried HEP and TheraGun. Scheduled for surgery on Monday.    10/16/2023 Patient states low back , hip mostly left hip pain flare . She is not able to get out of bed . She was not able to get out of car today. Was seen by chiro and that helped some . Is interested in OMT   11/27/2023 Patient states took meloxicam today because the hip has been hurting. Pain  is noticeable but not terrible. Pain has been consistent even with massage therapy and dry needling.   12/11/2023 Patient states she has completed the prednisone. It was helping until the wasn't anymore medication to take. Does not feel that well today. First thing in the morning it is bad to the point where she can not get up (left near butt). Been stretching and trying to strengthening glutes via body weight exercises. However, no change has been witnessed.    Relevant Historical Information: None pertinent Additional pertinent review of systems negative.   Current Outpatient Medications:    EPINEPHrine 0.3 mg/0.3 mL IJ SOAJ injection, Inject 0.3 mg into the muscle as needed for anaphylaxis., Disp: 1 each, Rfl: 0   escitalopram (LEXAPRO) 10 MG tablet, Take 10 mg by mouth daily., Disp: , Rfl:    meloxicam (MOBIC) 15 MG tablet, Take 1 tablet (15 mg total) by mouth daily., Disp: 30 tablet, Rfl: 0   methylPREDNISolone (MEDROL DOSEPAK) 4 MG TBPK tablet, Take 6 tablets on day 1.  Take 5 tablets on day 2.  Take 4 tablets on day 3.  Take 3 tablets on day 4.  Take 2 tablets on day 5.  Take 1 tablet on day 6., Disp: 21 tablet, Rfl: 0   minoxidil (LONITEN) 2.5 MG tablet, Take 2.5 mg by mouth daily. PATIENT TAKES 1/2 TABLET DAILY, Disp: , Rfl:    tirzepatide (MOUNJARO) 5 MG/0.5ML Pen, Inject 5 mg into the skin once a week., Disp: , Rfl:    Objective:     Vitals:   12/12/23 1021  BP: 102/60  Pulse: 75  SpO2: 98%  Weight: 165 lb 9.6 oz (75.1 kg)  Height: 5\' 2"  (1.575 m)      Body mass index is 30.29 kg/m.    Physical Exam:     General: awake, alert, and oriented no acute distress, nontoxic Skin: no suspicious lesions or rashes Neuro:sensation intact distally with no deficits, normal muscle tone, no atrophy, strength 5/5 in all tested lower ext groups Psych: normal mood and affect, speech clear   Left hip: No deformity, swelling or wasting ROM Flexion 90, ext 30, IR 45, ER 45 TTP  left SI  joint with positive sphinx NTTP over the hip flexors,  lumbar spine,gluteal musculature, greater trochanter, IT band, Negative log roll with FROM Negative FABER Negative FADIR Negative Piriformis test,  Negative trendelenberg Gait normal    Electronically signed by:  Kelsey [name] D.[name] 10:41 AM 12/12/23

## 2023-12-12 NOTE — Patient Instructions (Addendum)
 Continue HEP. Tylenol for day to day pain relief. Follow up in 3 to 4 weeks.

## 2023-12-19 ENCOUNTER — Encounter: Payer: Self-pay | Admitting: Vascular Surgery

## 2023-12-19 ENCOUNTER — Ambulatory Visit: Payer: BC Managed Care – PPO | Admitting: Vascular Surgery

## 2023-12-19 VITALS — BP 117/76 | HR 69 | Temp 98.0°F | Ht 62.0 in | Wt 164.0 lb

## 2023-12-19 DIAGNOSIS — I8392 Asymptomatic varicose veins of left lower extremity: Secondary | ICD-10-CM

## 2023-12-19 NOTE — Progress Notes (Signed)
 Patient ID: Kelsey [name], female   DOB: 1976-06-30, 48 y.o.   MRN: [id number]  Reason for Consult: Follow-up   Referred by Ardith Dark, MD  Subjective:     HPI:  Kelsey [name] is a 48 y.o. female known to me as a family member of one of my very long-term patients.  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  She has been wearing the thigh-high compression stockings as given here and these have helped her significantly.  She does not have any personal or family history of DVT.  Both of her family members had varicosities and her mother has had multiple treatments in the past.  Past Medical History:  Diagnosis Date   Hypothyroidism    Pulmonary embolus (HCC) 11/17/2022   Family History  Problem Relation Age of Onset   Diabetes Mother    Hyperlipidemia Mother    Hypertension Mother    Heart disease Father    Hyperlipidemia Father    Hypertension Father    Diabetes Sister    Stomach cancer Paternal Grandfather    Colon cancer Neg Hx    Colon polyps Neg Hx    Esophageal cancer Neg Hx    Pancreatic cancer Neg Hx    Past Surgical History:  Procedure Laterality Date   APPENDECTOMY     BREAST REDUCTION SURGERY     DILATION AND CURETTAGE OF UTERUS     LAPAROSCOPIC OVARIAN CYSTECTOMY     LIPOSUCTION     Tummy tuck N/A 2024    Short Social History:  Social History   Tobacco Use   Smoking status: Never   Smokeless tobacco: Never  Substance Use Topics   Alcohol use: Yes    Comment: Occ    Allergies  Allergen Reactions   Ferrlecit [Na Ferric Gluc Cplx In Sucrose] Hives, Swelling and Other (See Comments)    "Iron infusion"   Back pain, difficulty swallowing.    Current Outpatient Medications  Medication Sig Dispense Refill   EPINEPHrine 0.3 mg/0.3 mL IJ SOAJ injection Inject 0.3 mg into the muscle as needed for anaphylaxis. 1 each 0   escitalopram (LEXAPRO) 10 MG  tablet Take 10 mg by mouth daily.     meloxicam (MOBIC) 15 MG tablet Take 1 tablet (15 mg total) by mouth daily. 30 tablet 0   methylPREDNISolone (MEDROL DOSEPAK) 4 MG TBPK tablet Take 6 tablets on day 1.  Take 5 tablets on day 2.  Take 4 tablets on day 3.  Take 3 tablets on day 4.  Take 2 tablets on day 5.  Take 1 tablet on day 6. 21 tablet 0   minoxidil (LONITEN) 2.5 MG tablet Take 2.5 mg by mouth daily. PATIENT TAKES 1/2 TABLET DAILY     tirzepatide (MOUNJARO) 5 MG/0.5ML Pen Inject 5 mg into the skin once a week.     No current facility-administered medications for this visit.    Review of Systems  Constitutional:  Constitutional negative. HENT: HENT negative.  Eyes: Eyes negative.  Respiratory: Respiratory negative.  Cardiovascular: Cardiovascular negative.  GI: Gastrointestinal negative.  Musculoskeletal: Musculoskeletal negative.       Painful left varicosity Skin: Skin negative.  Neurological: Neurological negative. Hematologic: Hematologic/lymphatic negative.  Psychiatric: Psychiatric negative.        Objective:  Objective   Vitals:   12/19/23 1039  BP: 117/76  Pulse: 69  Temp: 98 F (  36.7 C)  SpO2: 98%  Weight: 164 lb (74.4 kg)  Height: 5\' 2"  (1.575 m)   Body mass index is 30 kg/m.  Physical Exam HENT:     Head: Normocephalic.     Nose: Nose normal.  Eyes:     Pupils: Pupils are equal, round, and reactive to light.  Cardiovascular:     Rate and Rhythm: Normal rate.  Pulmonary:     Effort: Pulmonary effort is normal.  Abdominal:     General: Abdomen is flat.  Musculoskeletal:     Cervical back: Normal range of motion and neck supple.     Right lower leg: No edema.     Left lower leg: No edema.  Skin:    General: Skin is warm.     Capillary Refill: Capillary refill takes less than 2 seconds.  Neurological:     General: No focal deficit present.     Mental Status: She is alert.  Psychiatric:        Mood and Affect: Mood normal.        Thought  Content: Thought content normal.        Judgment: Judgment normal.        Data: LEFT          Reflux NoRefluxReflux TimeDiameter cmsComments                          Yes                                   +--------------+---------+------+-----------+------------+--------+  CFV                    yes   >1 second                       +--------------+---------+------+-----------+------------+--------+  FV mid                  yes   >1 second                       +--------------+---------+------+-----------+------------+--------+  Popliteal    no                                              +--------------+---------+------+-----------+------------+--------+  GSV at SFJ              yes    >500 ms      0.65              +--------------+---------+------+-----------+------------+--------+  GSV prox thigh          yes    >500 ms      0.57              +--------------+---------+------+-----------+------------+--------+  GSV mid thigh           yes    >500 ms      0.54              +--------------+---------+------+-----------+------------+--------+  GSV dist thigh          yes    >500 ms      0.83              +--------------+---------+------+-----------+------------+--------+  GSV at knee  yes    >500 ms      0.46              +--------------+---------+------+-----------+------------+--------+  GSV prox calf           yes    >500 ms      0.80              +--------------+---------+------+-----------+------------+--------+  SSV Pop Fossa no                            0.27              +--------------+---------+------+-----------+------------+--------+  SSV prox calf           yes    >500 ms      0.24              +--------------+---------+------+-----------+------------+--------+         Summary:  Left:  - No evidence of deep vein thrombosis seen in the left lower extremity,  from  the common femoral through the popliteal veins.  - No evidence of superficial venous thrombosis in the left lower  extremity.  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  - The small saphenous vein is incompetent at the proximal calf.         Assessment/Plan:    48 year old female with symptomatic left lower extremity varicosity with associated reflux and a large greater saphenous vein.  Her symptoms have mostly improved with compression stockings and I have recommended continued compression stockings and she can follow-up up with me on an as-needed basis to discuss great saphenous vein ablation and stab phlebectomy less than 10.     Maeola Harman MD [facility]

## 2023-12-24 ENCOUNTER — Encounter: Payer: Self-pay | Admitting: Sports Medicine

## 2023-12-31 ENCOUNTER — Ambulatory Visit: Payer: BC Managed Care – PPO | Admitting: Physical Therapy

## 2024-01-07 ENCOUNTER — Ambulatory Visit: Payer: BC Managed Care – PPO | Admitting: Physical Therapy

## 2024-01-07 NOTE — Progress Notes (Unsigned)
 Ben Jackson D.[facility] [address] [place] [address] Phone: [phone]   Assessment and Plan:    1. Left hip pain 2. Chronic left SI joint pain 3. Chronic bilateral low back pain without sciatica 4. Degeneration of intervertebral disc of lumbar region with discogenic back pain -Chronic with exacerbation, subsequent visit - Unclear etiology of continued left lower back, SI joint, posterior hip pain.  Patient has had mild temporary relief with HEP, prednisone  Dosepaks, meloxicam  course, SI joint CSI, however she continues to experience significant daily pain limiting day-to-day activities - Recommend further evaluation with MRI of left hip and left lumbar spine to help evaluate and determine underlying cause of patient's ongoing pain.  Pain has failed to resolve despite >6 weeks of conservative therapy, pain at times >6/10, pain affecting day-to-day activities. - Continue HEP and physical therapy as tolerated - Continue Tylenol  for day-to-day pain relief -Do not recommend repeat prednisone  Dosepak with most recent Dosepak started on 11/27/2023  Pertinent previous records reviewed include none  Follow Up: 5 days after MRI to review results and discuss treatment plan   Subjective:   I, Leone Ralphs am a scribe for Dr. Cleora Daft.    Chief Complaint: neck and shoulder pain    HPI:    09/26/2022 Patient is a 48 year old female complaining of neck and shoulder pain. Patient states has been going on for months , she has flares out of no where, neck and shoulders tighten and dont let go, thinks it might be stress related, sometimes she does have numbness and tingling down to her fingers, no meds for the pain , upper trap pain, at rest she has a little pain , decreased ROM due to pain notes she felt a crack yesterday that was painful and then it felt good    10/17/2022 Patient states that she is okay    11/07/2022 Patient states she is okay     12/05/2022 Patient states she is good ,better    12/28/2022 Patient states moderate management of neck and upper back sx, continues to have periscapular pain. Notes flare-up of lower back pain, bilateral. Pain at posterior aspect of the hips/SI joint. Denies sharp shooting pain, n/t, weakness in B LE. Sx x 4 days. Has tried HEP and TheraGun. Scheduled for surgery on Monday.    10/16/2023 Patient states low back , hip mostly left hip pain flare . She is not able to get out of bed . She was not able to get out of car today. Was seen by chiro and that helped some . Is interested in OMT   11/27/2023 Patient states took meloxicam  today because the hip has been hurting. Pain is noticeable but not terrible. Pain has been consistent even with massage therapy and dry needling.    12/11/2023 Patient states she has completed the prednisone . It was helping until the wasn't anymore medication to take. Does not feel that well today. First thing in the morning it is bad to the point where she can not get up (left near butt). Been stretching and trying to strengthening glutes via body weight exercises. However, no change has been witnessed.   01/08/2024 Only minimal relief after SI joint CSI at previous office visit.  Patient still experiencing significant low back pain, pain into left posterior hip on a daily basis that will flare and limit day-to-day activities.   Relevant Historical Information: None pertinent  Additional pertinent review of systems negative.  Current Outpatient  Medications  Medication Sig Dispense Refill   EPINEPHrine  0.3 mg/0.3 mL IJ SOAJ injection Inject 0.3 mg into the muscle as needed for anaphylaxis. 1 each 0   escitalopram (LEXAPRO) 10 MG tablet Take 10 mg by mouth daily.     meloxicam  (MOBIC ) 15 MG tablet Take 1 tablet (15 mg total) by mouth daily. 30 tablet 0   methylPREDNISolone  (MEDROL  DOSEPAK) 4 MG TBPK tablet Take 6 tablets on day 1.  Take 5 tablets on day 2.  Take 4 tablets  on day 3.  Take 3 tablets on day 4.  Take 2 tablets on day 5.  Take 1 tablet on day 6. 21 tablet 0   minoxidil (LONITEN) 2.5 MG tablet Take 2.5 mg by mouth daily. PATIENT TAKES 1/2 TABLET DAILY     tirzepatide  (MOUNJARO ) 5 MG/0.5ML Pen Inject 5 mg into the skin once a week.     No current facility-administered medications for this visit.      Objective:     Vitals:   01/08/24 1023  BP: 102/60  Pulse: 67  SpO2: 99%  Weight: 167 lb 3.2 oz (75.8 kg)  Height: 5\' 2"  (1.575 m)      Body mass index is 30.58 kg/m.    Physical Exam:      General: awake, alert, and oriented no acute distress, nontoxic Skin: no suspicious lesions or rashes Neuro:sensation intact distally with no deficits, normal muscle tone, no atrophy, strength 5/5 in all tested lower ext groups Psych: normal mood and affect, speech clear   Left hip: No deformity, swelling or wasting ROM Flexion 90, ext 30, IR 45, ER 45 TTP  left SI joint with positive sphinx NTTP over the hip flexors,  lumbar spine,gluteal musculature, greater trochanter, IT band, Negative log roll with FROM Negative FABER Negative FADIR Negative Piriformis test,  Negative trendelenberg Gait normal    Electronically signed by:  Marshall Skeeter D.[facility] 11:02 AM 01/08/24

## 2024-01-08 ENCOUNTER — Ambulatory Visit: Admitting: Sports Medicine

## 2024-01-08 VITALS — BP 102/60 | HR 67 | Ht 62.0 in | Wt 167.2 lb

## 2024-01-08 DIAGNOSIS — G8929 Other chronic pain: Secondary | ICD-10-CM

## 2024-01-08 DIAGNOSIS — M25552 Pain in left hip: Secondary | ICD-10-CM

## 2024-01-08 DIAGNOSIS — M5136 Other intervertebral disc degeneration, lumbar region with discogenic back pain only: Secondary | ICD-10-CM | POA: Diagnosis not present

## 2024-01-08 DIAGNOSIS — M533 Sacrococcygeal disorders, not elsewhere classified: Secondary | ICD-10-CM

## 2024-01-08 DIAGNOSIS — M545 Low back pain, unspecified: Secondary | ICD-10-CM

## 2024-01-08 NOTE — Patient Instructions (Addendum)
 Lumbar mri. Left hip mri without contrast at [place]. Follow up 5 days after MRI.

## 2024-01-13 ENCOUNTER — Ambulatory Visit

## 2024-01-13 DIAGNOSIS — M5136 Other intervertebral disc degeneration, lumbar region with discogenic back pain only: Secondary | ICD-10-CM

## 2024-01-13 DIAGNOSIS — M545 Low back pain, unspecified: Secondary | ICD-10-CM

## 2024-01-13 DIAGNOSIS — M25552 Pain in left hip: Secondary | ICD-10-CM

## 2024-01-13 DIAGNOSIS — G8929 Other chronic pain: Secondary | ICD-10-CM | POA: Diagnosis not present

## 2024-01-13 DIAGNOSIS — M5126 Other intervertebral disc displacement, lumbar region: Secondary | ICD-10-CM | POA: Diagnosis not present

## 2024-01-13 DIAGNOSIS — M5137 Other intervertebral disc degeneration, lumbosacral region with discogenic back pain only: Secondary | ICD-10-CM | POA: Diagnosis not present

## 2024-01-13 DIAGNOSIS — M47816 Spondylosis without myelopathy or radiculopathy, lumbar region: Secondary | ICD-10-CM | POA: Diagnosis not present

## 2024-01-14 ENCOUNTER — Encounter: Payer: Self-pay | Admitting: Physical Therapy

## 2024-01-14 ENCOUNTER — Ambulatory Visit: Payer: BC Managed Care – PPO | Attending: Physician Assistant | Admitting: Physical Therapy

## 2024-01-14 DIAGNOSIS — M5459 Other low back pain: Secondary | ICD-10-CM | POA: Diagnosis not present

## 2024-01-14 DIAGNOSIS — R252 Cramp and spasm: Secondary | ICD-10-CM | POA: Insufficient documentation

## 2024-01-14 DIAGNOSIS — L905 Scar conditions and fibrosis of skin: Secondary | ICD-10-CM | POA: Diagnosis not present

## 2024-01-14 DIAGNOSIS — R278 Other lack of coordination: Secondary | ICD-10-CM | POA: Diagnosis not present

## 2024-01-14 NOTE — Therapy (Signed)
 OUTPATIENT PHYSICAL THERAPY FEMALE PELVIC TREATMENT   Patient Name: Kelsey [name] MRN: [id number] DOB:1975/10/01, 48 y.o., female Today's Date: 01/14/2024  END OF SESSION:  PT End of Session - 01/14/24 1238     Visit Number 6    Date for PT Re-Evaluation 04/22/24    Authorization Type [employer]    PT Start Time 1237    PT Stop Time 1315    PT Time Calculation (min) 38 min    Activity Tolerance Patient tolerated treatment well    Behavior During Therapy [facility] for tasks assessed/performed             Past Medical History:  Diagnosis Date   Hypothyroidism    Pulmonary embolus (HCC) 11/17/2022   Past Surgical History:  Procedure Laterality Date   APPENDECTOMY     BREAST REDUCTION SURGERY     DILATION AND CURETTAGE OF UTERUS     LAPAROSCOPIC OVARIAN CYSTECTOMY     LIPOSUCTION     Tummy tuck N/A 2024   Patient Active Problem List   Diagnosis Date Noted   Diarrhea 10/11/2023   Palpitation 10/11/2023   Cholelithiasis 04/10/2023   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   Anemia 01/10/2023   Pulmonary embolism (HCC) 01/09/2023   Macromastia 12/28/2022   Paresthesia 09/07/2022   Obesity 02/10/2022   Anxiety 07/28/2021   Neck pain 07/28/2021   Nonintractable headache 07/28/2021   Insomnia 07/28/2021    PCP: Rodney Clamp, MD  REFERRING PROVIDER: Edmonia Gottron, PA-C   REFERRING DIAG:  R19.8 (ICD-10-CM) - Alternating constipation and diarrhea  R15.9 (ICD-10-CM) - Incontinence of feces, unspecified fecal incontinence type    THERAPY DIAG:  Cramp and spasm  Other low back pain  Scar  Other lack of coordination  Rationale for Evaluation and Treatment: Rehabilitation  ONSET DATE: 02/2022  SUBJECTIVE:                                                                                                                                                                                           SUBJECTIVE STATEMENT: I have been doing my exercises. I tried the gym.  Urinary leakage is 35% better. I have had some stool leakage. When I was lifting in the gym I had more leakage. The scar is feeling better.    PAIN: 11/13/22 Are you having pain? Yes NPRS scale: 0/10 back; the left gluteal 4/10 Pain location:  low back, pelvic and hips  Pain type: aching and sharp Pain description: constant   Aggravating factors: sitting still, laying in bed in the morning Relieving factors: as the day goes on  PRECAUTIONS: None  RED  FLAGS: None   WEIGHT BEARING RESTRICTIONS: No  FALLS:  Has patient fallen in last 6 months? 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  PLOF: Independent  PATIENT GOALS: work on pain  PERTINENT HISTORY:  Hypothyroidism; Pulmonary embolus 11/17/2022; Appendectomy; Laparoscopic ovarian cystectomy;  Sexual abuse: No  BOWEL MOVEMENT: Pain with bowel movement: Yes, can be in abdomen 7-8/10 Type of bowel movement:Type (Bristol Stool Scale) Type 1, 4, 6, Frequency daily, Strain Yes, and Splinting no Fully empty rectum: No urgency with bowel movement Leakage: Yes: randomly when has diarrhea Pads: Yes:   Fiber supplement: Yes: Benefiber  URINATION: Pain with urination: No Fully empty bladder: Yes:   Stream: Strong Urgency: No Frequency: every 2 hours Leakage: Coughing, Sneezing, Laughing, Exercise, and Lifting; stopped going to the gym due to fear of leaking urine Pads: No  INTERCOURSE: Pain with intercourse:  none   PREGNANCY: Vaginal deliveries 4 Tearing No   PROLAPSE: None   OBJECTIVE:  Note: Objective measures were completed at Evaluation unless otherwise noted.  DIAGNOSTIC FINDINGS:  Colonoscopy negative    COGNITION: Overall cognitive status: Within functional limits for tasks assessed     SENSATION: Light touch: Deficits around the lower abdominal scar Proprioception:  Appears intact  MUSCLE LENGTH: Thomas test: Right -35 deg; Left -10 deg    POSTURE: decreased lumbar lordosis  PELVIC ALIGNMENT:left ilium is posteriorly rotated; when squatting she has pain in the left SI joint; decreased movement of the left SI joint; sacrum rotated left  LUMBARAROM/PROM:  A/PROM A/PROM  eval  Right rotation Decreased by 25%   (Blank rows = not tested)  LOWER EXTREMITY ZOX:WRUE ROM of bilateral hips   LOWER EXTREMITY MMT:  MMT Right eval Left eval  Hip flexion 4/5 4/5  Hip extension 4/5 4/5  Hip abduction 3+/5 4/5  Hip adduction 4/5 4-/5   PALPATION:   General  bulges the lower abdomen when contracting, decreased mobility of the lower abdominal scar, able to expand the lower rib cage but difficulty with bringing it together.                 External Perineal intact                             Internal Pelvic Floor tightness along the sides of the introitus  Patient confirms identification and approves PT to assess internal pelvic floor and treatment Yes  PELVIC MMT:   MMT eval 11/14/23  Vaginal 3/5 ant and post. 2/5 on the sides, hold 3 s, quick flick but slow recruitment 3/5 with circular hug of therapist index finger  Internal Anal Sphincter    External Anal Sphincter    Puborectalis    Diastasis Recti    (Blank rows = not tested)        TONE: average  PROLAPSE: none  TODAY'S TREATMENT:   01/14/24 Neuromuscular re-education: Form correction: Supine transverse abdominus, with ball squeeze with breathing and working with equal contraction of the upper and lower abdominal Supine push ball into the knee to engage the lower abdominal Supine alternate extended leg with shoulder flexion Diagonal with arms to work the obliques Quadruped lift alternate extremity working keeping control of trunk Pallof with green band  11/21/23 Manual: Soft tissue mobilization: To assess for dry needling Manual work to the lumbar multifidi  and quadratus  to elongate after dry needling Manual work to the pelvic floor externally through the patients clothes Spinal mobilization: PA and rotation grade 3 to L1 to L5  Trigger Point Dry Needling Subsequent Treatment: Instructions reviewed, if requested by the patient, prior to subsequent dry needling treatment.   Patient Verbal Consent Given: Yes Education Handout Provided: Previously Provided Muscles Treated: left multifidi Electrical Stimulation Performed: No Treatment Response/Outcome: elongation of muscle and trigger point response Neuromuscular re-education: Core facilitation: Pallof in standing 10 x each way with pelvic floor contraction Standing bilateral shoulder extension with pelvic floor contraction    11/14/23 Manual: Soft tissue mobilization: To assess for dry needling Manual work to the lumbar multifidi and quadratus to elongate after dry needling Internal pelvic floor techniques: No emotional/communication barriers or cognitive limitation. Patient is motivated to learn. Patient understands and agrees with treatment goals and plan. PT explains patient will be examined in standing, sitting, and lying down to see how their muscles and joints work. When they are ready, they will be asked to remove their underwear so PT can examine their perineum. The patient is also given the option of providing their own chaperone as one is not provided in our facility. The patient also has the right and is explained the right to defer or refuse any part of the evaluation or treatment including the internal exam. With the patient's consent, PT will use one gloved finger to gently assess the muscles of the pelvic floor, seeing how well it contracts and relaxes and if there is muscle symmetry. After, the patient will get dressed and PT and patient will discuss exam findings and plan of care. PT and patient discuss plan of care, schedule, attendance policy and HEP activities.  Going through the vaginal  canal working on the side of the introitus and ischiocavernosus to elongate for circular contraction of the pelvic floor Trigger Point Dry Needling Subsequent Treatment: Instructions reviewed, if requested by the patient, prior to subsequent dry needling treatment.   Patient Verbal Consent Given: Yes Education Handout Provided: Previously Provided Muscles Treated: left multifidi Electrical Stimulation Performed: No Treatment Response/Outcome: elongation of muscle and trigger point response Neuromuscular re-education: Pelvic floor contraction training: Therapist finger in the vaginal canal to give tactile cues to perform a circular contraction, not bulge the pelvic floor or lower abdomen.        PATIENT EDUCATION:  11/07/23 Education details: educated patient on scar massage;Access Code: [email or web address] Person educated: Patient Education method: Explanation, Demonstration, Tactile cues, Verbal cues, and Handouts Education comprehension: verbalized understanding, returned demonstration, verbal cues required, tactile cues required, and needs further education  HOME EXERCISE PROGRAM: 11/07/23 Access Code: [email or web address] URL: [email or web address] Date: 11/07/2023 Prepared by: Marsha Skeen  Exercises - Cat Cow  - 1 x daily - 7 x weekly - 1 sets - 10 reps - Diaphragmatic Breathing in Child's Pose with Pelvic Floor Relaxation  - 1 x daily - 7 x weekly - 1 sets - 1 reps - 30 sec hold - Pigeon Pose  - 1 x daily - 7 x weekly - 1 sets - 1 reps - 30 sec hold - Happy Baby with Pelvic Floor Lengthening  - 1 x daily - 7 x weekly - 1 sets - 1 reps - 30 sec hold - Quadruped Full Range Thoracic Rotation with Reach  - 1 x daily - 7 x weekly - 3 sets - 10 reps - Quadruped Yoga Block Lift Off  - 1 x daily - 7 x  weekly - 3 sets - 10 reps  ASSESSMENT:  CLINICAL IMPRESSION: Patient is a 48 y.o. female who was seen today for physical therapy  treatment for fecal and urinary leakage.  Patient reports her  urinary leakage is 35% better. She is not leaking mucous in the rectal area. Patient reports she is gaining weight consistently with reduction of calorie intake. She was able to contract the core equally in the upper abdomen and lower. She has difficulty with doing the bird dog due to core weakness.   Patient will benefit from skilled therapy to reduce pain, improve strength to reduce leakage and improve quality of life.    OBJECTIVE IMPAIRMENTS: decreased coordination, decreased ROM, decreased strength, increased fascial restrictions, increased muscle spasms, and pain.   ACTIVITY LIMITATIONS: sitting, continence, toileting, and exercise  PARTICIPATION LIMITATIONS: driving, shopping, community activity, and occupation  PERSONAL FACTORS: Time since onset of injury/illness/exacerbation and 1-2 comorbidities: Hypothyroidism; Pulmonary embolus 11/17/2022; Appendectomy; Laparoscopic ovarian cystectomy;  are also affecting patient's functional outcome.   REHAB POTENTIAL: Excellent  CLINICAL DECISION MAKING: Evolving/moderate complexity  EVALUATION COMPLEXITY: Moderate   GOALS: Goals reviewed with patient? Yes  SHORT TERM GOALS: Target date: 11/21/23  Patient educated on scar mobility to improve sensation and elongation of tissue.  Baseline: Goal status: Met 10/31/23  2.  Patient is able to contract the pelvic floor in a full circle and strength >/= 3/5.  Baseline:  Goal status: Met 11/14/23  3.  Patient independent with initial HEP for flexibility exercises.  Baseline:  Goal status: Met 11/21/23  4.  Patient educated on urge to void with bowel movements.  Baseline:  Goal status: INITIAL  LONG TERM GOALS: Target date: 04/22/24  Patient independent with advanced HEP with core and pelvic floor strength.  Baseline:  Goal status: INITIAL  2.  Patient is able to return to the gym with understanding how to exercise correctly to reduce strain on pelvic floor and urinary leakage decreased >/=  75%.  Baseline:  Goal status: INITIAL  3.  Patient is able to contract the upper and lower abdominals equally to reduce pressure on the pelvic floor and reduce urinary leakage >/= 75% with coughing and sneezing and laughing.  Baseline:  Goal status: INITIAL  4.  Patient lumbar pain decreased </= 1/10 due ot improved mobility and increased core strength.  Baseline:  Goal status: INITIAL   PLAN:  PT FREQUENCY: 1-2x/week  PT DURATION: 6 months  PLANNED INTERVENTIONS: 97110-Therapeutic exercises, 97530- Therapeutic activity, [id number]- Neuromuscular re-education, 97535- Self Care, [id number]- Manual therapy, [id number]- Electrical stimulation (unattended), 97035- Ultrasound, Patient/Family education, Taping, Dry Needling, Joint mobilization, Spinal mobilization, Scar mobilization, Cryotherapy, Moist heat, and Biofeedback  PLAN FOR NEXT SESSION: dry needling the gluteal to increase hip mobility,  check pelvic alignment. Manual work to the pelvic floor, urge to void, exercise in standing   Marsha Skeen, PT 01/14/24 1:16 PM

## 2024-01-21 ENCOUNTER — Ambulatory Visit: Payer: BC Managed Care – PPO | Attending: Physician Assistant | Admitting: Physical Therapy

## 2024-01-21 ENCOUNTER — Encounter: Payer: Self-pay | Admitting: Physical Therapy

## 2024-01-21 DIAGNOSIS — R252 Cramp and spasm: Secondary | ICD-10-CM | POA: Diagnosis not present

## 2024-01-21 DIAGNOSIS — L905 Scar conditions and fibrosis of skin: Secondary | ICD-10-CM | POA: Diagnosis not present

## 2024-01-21 DIAGNOSIS — M5459 Other low back pain: Secondary | ICD-10-CM | POA: Diagnosis not present

## 2024-01-21 DIAGNOSIS — R278 Other lack of coordination: Secondary | ICD-10-CM | POA: Insufficient documentation

## 2024-01-21 NOTE — Therapy (Signed)
 OUTPATIENT PHYSICAL THERAPY FEMALE PELVIC TREATMENT   Patient Name: Kelsey [name] MRN: [id number] DOB:08-Oct-1975, 48 y.o., female Today's Date: 01/21/2024  END OF SESSION:  PT End of Session - 01/21/24 1238     Visit Number 7    Date for PT Re-Evaluation 04/22/24    Authorization Type [employer]    PT Start Time 1237    PT Stop Time 1315    PT Time Calculation (min) 38 min    Activity Tolerance Patient tolerated treatment well    Behavior During Therapy [facility] for tasks assessed/performed             Past Medical History:  Diagnosis Date   Hypothyroidism    Pulmonary embolus (HCC) 11/17/2022   Past Surgical History:  Procedure Laterality Date   APPENDECTOMY     BREAST REDUCTION SURGERY     DILATION AND CURETTAGE OF UTERUS     LAPAROSCOPIC OVARIAN CYSTECTOMY     LIPOSUCTION     Tummy tuck N/A 2024   Patient Active Problem List   Diagnosis Date Noted   Diarrhea 10/11/2023   Palpitation 10/11/2023   Cholelithiasis 04/10/2023   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   Anemia 01/10/2023   Pulmonary embolism (HCC) 01/09/2023   Macromastia 12/28/2022   Paresthesia 09/07/2022   Obesity 02/10/2022   Anxiety 07/28/2021   Neck pain 07/28/2021   Nonintractable headache 07/28/2021   Insomnia 07/28/2021    PCP: Rodney Clamp, MD  REFERRING PROVIDER: Edmonia Gottron, PA-C   REFERRING DIAG:  R19.8 (ICD-10-CM) - Alternating constipation and diarrhea  R15.9 (ICD-10-CM) - Incontinence of feces, unspecified fecal incontinence type    THERAPY DIAG:  Cramp and spasm  Other low back pain  Scar  Other lack of coordination  Rationale for Evaluation and Treatment: Rehabilitation  ONSET DATE: 02/2022  SUBJECTIVE:                                                                                                                                                                                           SUBJECTIVE STATEMENT: I am feeling the same. I have not had stool leakage  this week and may be due to not doing anything strenuous. I will go over my MRI results tomorrow.     PAIN: 11/13/22 Are you having pain? Yes NPRS scale: 0/10 back; the left gluteal 4/10 Pain location:  low back, pelvic and hips  Pain type: aching and sharp Pain description: constant   Aggravating factors: sitting still, laying in bed in the morning Relieving factors: as the day goes on  PRECAUTIONS: None  RED FLAGS: None   WEIGHT BEARING  RESTRICTIONS: No  FALLS:  Has patient fallen in last 6 months? 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  PLOF: Independent  PATIENT GOALS: work on pain  PERTINENT HISTORY:  Hypothyroidism; Pulmonary embolus 11/17/2022; Appendectomy; Laparoscopic ovarian cystectomy;  Sexual abuse: No  BOWEL MOVEMENT: Pain with bowel movement: Yes, can be in abdomen 7-8/10 Type of bowel movement:Type (Bristol Stool Scale) Type 1, 4, 6, Frequency daily, Strain Yes, and Splinting no Fully empty rectum: No urgency with bowel movement Leakage: Yes: randomly when has diarrhea Pads: Yes:   Fiber supplement: Yes: Benefiber  URINATION: Pain with urination: No Fully empty bladder: Yes:   Stream: Strong Urgency: No Frequency: every 2 hours Leakage: Coughing, Sneezing, Laughing, Exercise, and Lifting; stopped going to the gym due to fear of leaking urine Pads: No  INTERCOURSE: Pain with intercourse:  none   PREGNANCY: Vaginal deliveries 4 Tearing No   PROLAPSE: None   OBJECTIVE:  Note: Objective measures were completed at Evaluation unless otherwise noted.  DIAGNOSTIC FINDINGS:  Colonoscopy negative    COGNITION: Overall cognitive status: Within functional limits for tasks assessed     SENSATION: Light touch: Deficits around the lower abdominal scar Proprioception: Appears intact  MUSCLE LENGTH: Thomas  test: Right -35 deg; Left -10 deg    POSTURE: decreased lumbar lordosis  PELVIC ALIGNMENT:left ilium is posteriorly rotated; when squatting she has pain in the left SI joint; decreased movement of the left SI joint; sacrum rotated left  LUMBARAROM/PROM:  A/PROM A/PROM  eval  Right rotation Decreased by 25%   (Blank rows = not tested)  LOWER EXTREMITY ZOX:WRUE ROM of bilateral hips   LOWER EXTREMITY MMT:  MMT Right eval Left eval Right 01/21/24 Left  01/21/24  Hip flexion 4/5 4/5 5/5 4/5  Hip extension 4/5 4/5 5/5 4+/5  Hip abduction 3+/5 4/5 5/5 4+/5  Hip adduction 4/5 4-/5 5/5 4+/5   PALPATION:   General  bulges the lower abdomen when contracting, decreased mobility of the lower abdominal scar, able to expand the lower rib cage but difficulty with bringing it together.  01/21/24: left ilium is rotated posteriorly                External Perineal intact                             Internal Pelvic Floor tightness along the sides of the introitus  Patient confirms identification and approves PT to assess internal pelvic floor and treatment Yes  PELVIC MMT:   MMT eval 11/14/23 01/21/24  Vaginal 3/5 ant and post. 2/5 on the sides, hold 3 s, quick flick but slow recruitment 3/5 with circular hug of therapist index finger 3/5 in supine 2/5 in standing  Internal Anal Sphincter     External Anal Sphincter     Puborectalis     Diastasis Recti     (Blank rows = not tested)        TONE: average  PROLAPSE: none  TODAY'S TREATMENT:   01/21/24 Manual: Internal pelvic floor techniques: No emotional/communication barriers or cognitive limitation. Patient is motivated to learn. Patient understands and agrees with treatment goals and plan. PT explains patient will be examined in standing, sitting, and lying down to see how their muscles and joints work. When they are ready, they will be asked to  remove their underwear so PT can examine their perineum. The patient is also given the  option of providing their own chaperone as one is not provided in our facility. The patient also has the right and is explained the right to defer or refuse any part of the evaluation or treatment including the internal exam. With the patient's consent, PT will use one gloved finger to gently assess the muscles of the pelvic floor, seeing how well it contracts and relaxes and if there is muscle symmetry. After, the patient will get dressed and PT and patient will discuss exam findings and plan of care. PT and patient discuss plan of care, schedule, attendance policy and HEP activities.  Using a gloved finger going through the vaginal canal working on the levator ani and obturator internist with contract relax and trigger point release Neuromuscular re-education: Pelvic floor contraction training: Supine therapist finger in the vaginal canal working pelvic floor contraction and tapping on the left side Standing therapist finger in the vaginal canal working on pelvic floor contraction but she was having more difficulty Self-care: Therapist educated patient on vaginal weights to be used to strengthen in standing and give the pelvic floor tactile cues to contract    01/14/24 Neuromuscular re-education: Form correction: Supine transverse abdominus, with ball squeeze with breathing and working with equal contraction of the upper and lower abdominal Supine push ball into the knee to engage the lower abdominal Supine alternate extended leg with shoulder flexion Diagonal with arms to work the obliques Quadruped lift alternate extremity working keeping control of trunk Pallof with green band  11/21/23 Manual: Soft tissue mobilization: To assess for dry needling Manual work to the lumbar multifidi and quadratus to elongate after dry needling Manual work to the pelvic floor externally through the patients clothes Spinal mobilization: PA and rotation grade 3 to L1 to L5  Trigger Point Dry  Needling Subsequent Treatment: Instructions reviewed, if requested by the patient, prior to subsequent dry needling treatment.   Patient Verbal Consent Given: Yes Education Handout Provided: Previously Provided Muscles Treated: left multifidi Electrical Stimulation Performed: No Treatment Response/Outcome: elongation of muscle and trigger point response Neuromuscular re-education: Core facilitation: Pallof in standing 10 x each way with pelvic floor contraction Standing bilateral shoulder extension with pelvic floor contraction    11/14/23 Manual: Soft tissue mobilization: To assess for dry needling Manual work to the lumbar multifidi and quadratus to elongate after dry needling Internal pelvic floor techniques: No emotional/communication barriers or cognitive limitation. Patient is motivated to learn. Patient understands and agrees with treatment goals and plan. PT explains patient will be examined in standing, sitting, and lying down to see how their muscles and joints work. When they are ready, they will be asked to remove their underwear so PT can examine their perineum. The patient is also given the option of providing their own chaperone as one is not provided in our facility. The patient also has the right and is explained the right to defer or refuse any part of the evaluation or treatment including the internal exam. With the patient's consent, PT will use one gloved finger to gently assess the muscles of the pelvic floor, seeing how well it contracts and relaxes and if there is muscle symmetry. After, the patient will get dressed and PT and patient will discuss exam findings and plan of care. PT and patient discuss plan of care, schedule, attendance policy and HEP activities.  Going through the vaginal canal working on the side of  the introitus and ischiocavernosus to elongate for circular contraction of the pelvic floor Trigger Point Dry Needling Subsequent Treatment: Instructions  reviewed, if requested by the patient, prior to subsequent dry needling treatment.   Patient Verbal Consent Given: Yes Education Handout Provided: Previously Provided Muscles Treated: left multifidi Electrical Stimulation Performed: No Treatment Response/Outcome: elongation of muscle and trigger point response Neuromuscular re-education: Pelvic floor contraction training: Therapist finger in the vaginal canal to give tactile cues to perform a circular contraction, not bulge the pelvic floor or lower abdomen.        PATIENT EDUCATION:  11/07/23 Education details: educated patient on scar massage;Access Code: [email or web address] Person educated: Patient Education method: Explanation, Demonstration, Tactile cues, Verbal cues, and Handouts Education comprehension: verbalized understanding, returned demonstration, verbal cues required, tactile cues required, and needs further education  HOME EXERCISE PROGRAM: 11/07/23 Access Code: [email or web address] URL: [email or web address] Date: 11/07/2023 Prepared by: Marsha Skeen  Exercises - Cat Cow  - 1 x daily - 7 x weekly - 1 sets - 10 reps - Diaphragmatic Breathing in Child's Pose with Pelvic Floor Relaxation  - 1 x daily - 7 x weekly - 1 sets - 1 reps - 30 sec hold - Pigeon Pose  - 1 x daily - 7 x weekly - 1 sets - 1 reps - 30 sec hold - Happy Baby with Pelvic Floor Lengthening  - 1 x daily - 7 x weekly - 1 sets - 1 reps - 30 sec hold - Quadruped Full Range Thoracic Rotation with Reach  - 1 x daily - 7 x weekly - 3 sets - 10 reps - Quadruped Yoga Block Lift Off  - 1 x daily - 7 x weekly - 3 sets - 10 reps  ASSESSMENT:  CLINICAL IMPRESSION: Patient is a 48 y.o. female who was seen today for physical therapy  treatment for fecal and urinary leakage.  Patient reports her urinary leakage is 35% better. She has weakness in the left hip. She will be seeing her doctor tomorrow to go over her MRI results for the left back and hip Patient pelvic floor  strength is 3/5 in supine and 2/5 in standing. She was introduced to vaginal weights to work on her strength. Patient has not had stool leakage this week and could be due to her not doing a lot of heavy lifting. Her left ilium is rotated posteriorly.    Patient will benefit from skilled therapy to reduce pain, improve strength to reduce leakage and improve quality of life.    OBJECTIVE IMPAIRMENTS: decreased coordination, decreased ROM, decreased strength, increased fascial restrictions, increased muscle spasms, and pain.   ACTIVITY LIMITATIONS: sitting, continence, toileting, and exercise  PARTICIPATION LIMITATIONS: driving, shopping, community activity, and occupation  PERSONAL FACTORS: Time since onset of injury/illness/exacerbation and 1-2 comorbidities: Hypothyroidism; Pulmonary embolus 11/17/2022; Appendectomy; Laparoscopic ovarian cystectomy;  are also affecting patient's functional outcome.   REHAB POTENTIAL: Excellent  CLINICAL DECISION MAKING: Evolving/moderate complexity  EVALUATION COMPLEXITY: Moderate   GOALS: Goals reviewed with patient? Yes  SHORT TERM GOALS: Target date: 11/21/23  Patient educated on scar mobility to improve sensation and elongation of tissue.  Baseline: Goal status: Met 10/31/23  2.  Patient is able to contract the pelvic floor in a full circle and strength >/= 3/5.  Baseline:  Goal status: Met 11/14/23  3.  Patient independent with initial HEP for flexibility exercises.  Baseline:  Goal status: Met 11/21/23  4.  Patient educated on urge to void with bowel movements.  Baseline:  Goal status: INITIAL  LONG TERM GOALS: Target date: 04/22/24  Patient independent with advanced HEP with core and pelvic floor strength.  Baseline:  Goal status: INITIAL  2.  Patient is able to return to the gym with understanding how to exercise correctly to reduce strain on pelvic floor and urinary leakage decreased >/= 75%.  Baseline:  Goal status: INITIAL  3.   Patient is able to contract the upper and lower abdominals equally to reduce pressure on the pelvic floor and reduce urinary leakage >/= 75% with coughing and sneezing and laughing.  Baseline:  Goal status: INITIAL  4.  Patient lumbar pain decreased </= 1/10 due ot improved mobility and increased core strength.  Baseline:  Goal status: INITIAL   PLAN:  PT FREQUENCY: 1-2x/week  PT DURATION: 6 months  PLANNED INTERVENTIONS: 97110-Therapeutic exercises, 97530- Therapeutic activity, [id number]- Neuromuscular re-education, 97535- Self Care, [id number]- Manual therapy, 97014- Electrical stimulation (unattended), 97035- Ultrasound, Patient/Family education, Taping, Dry Needling, Joint mobilization, Spinal mobilization, Scar mobilization, Cryotherapy, Moist heat, and Biofeedback  PLAN FOR NEXT SESSION: dry needling the gluteal to increase hip mobility,  check pelvic alignment. Manual work to the pelvic floor, urge to void, exercise in standing; go over the vaginal weights.    Marsha Skeen, PT 01/21/24 1:22 PM

## 2024-01-21 NOTE — Progress Notes (Unsigned)
 Ben Jackson D.[facility] [address] [place] [address] Phone: [phone]   Assessment and Plan:    1. Chronic bilateral low back pain with left-sided sciatica 2. Degeneration of intervertebral disc of lumbar region with discogenic back pain 3. Chronic left SI joint pain -Chronic with exacerbation, subsequent visit - Continued left lower back pain, left SI joint pain, left posterior hip pain.  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  May be experiencing a combination of degenerative changes from low back, irritation of left L5 descending nerve root, and degeneration from partial ankylosis of left SI joint - Patient elected for epidural CSI to left-sided L5-S1.  Order placed today -Continue activity as tolerated including low impact exercise such as stationary bike, elliptical, water aerobics - Continue with being physical therapy - Continue Tylenol  for day-to-day pain relief  Pertinent previous records reviewed include MRI hip and lumbar spine 01/13/2024  Follow Up: 2 weeks after epidural to review benefit.   Subjective:   I, Keefer Soulliere Sundra Engel, am serving as a Neurosurgeon for Doctor Ulysees Gander  Chief Complaint: neck and shoulder pain    HPI:    09/26/2022 Patient is a 48 year old female complaining of neck and shoulder pain. Patient states has been going on for months , she has flares out of no where, neck and shoulders tighten and dont let go, thinks it might be stress related, sometimes she does have numbness and tingling  down to her fingers, no meds for the pain , upper trap pain, at rest she has a little pain , decreased ROM due to pain notes she felt a crack yesterday that was painful and then it felt good    10/17/2022 Patient states that she is okay    11/07/2022 Patient states she is okay    12/05/2022 Patient states she is good ,better    12/28/2022 Patient states moderate management of neck and upper back sx, continues to have periscapular pain. Notes flare-up of lower back pain, bilateral. Pain at posterior aspect of the hips/SI joint. Denies sharp shooting pain, n/t, weakness in B LE. Sx x 4 days. Has tried HEP and TheraGun. Scheduled for surgery on Monday.    10/16/2023 Patient states low back , hip mostly left hip pain flare . She is not able to get out of bed . She was not able to get out of car today. Was seen by chiro and that helped some . Is interested in OMT   11/27/2023 Patient states took meloxicam  today because the hip has been hurting. Pain is noticeable but not terrible. Pain has been consistent even with massage therapy and dry needling.    12/11/2023 Patient states she has completed the prednisone . It was helping until the wasn't anymore medication to take. Does not feel that well today. First thing in the morning it is bad to the point where she can not get up (left near butt). Been stretching and trying to strengthening glutes via body weight exercises. However, no change has been witnessed.    01/08/2024 Only minimal relief after SI joint CSI at previous office visit.  Patient still experiencing significant low back pain, pain into left posterior hip on a daily basis that will flare and limit day-to-day activities.  01/22/2024 Patient states she is the same. Hasn't been able to do her ADLs   Relevant Historical Information: None pertinent    Additional pertinent review of systems negative.  Current Outpatient Medications  Medication Sig Dispense Refill   EPINEPHrine  0.3 mg/0.3 mL  IJ SOAJ injection Inject 0.3 mg into the muscle as needed for anaphylaxis. 1 each 0   escitalopram (LEXAPRO) 10 MG tablet Take 10 mg by mouth daily.     meloxicam  (MOBIC ) 15 MG tablet Take 1 tablet (15 mg total) by mouth daily. 30 tablet 0   methylPREDNISolone  (MEDROL  DOSEPAK) 4 MG TBPK tablet Take 6 tablets on day 1.  Take 5 tablets on day 2.  Take 4 tablets on day 3.  Take 3 tablets on day 4.  Take 2 tablets on day 5.  Take 1 tablet on day 6. 21 tablet 0   minoxidil (LONITEN) 2.5 MG tablet Take 2.5 mg by mouth daily. PATIENT TAKES 1/2 TABLET DAILY     tirzepatide  (MOUNJARO ) 5 MG/0.5ML Pen Inject 5 mg into the skin once a week.     No current facility-administered medications for this visit.      Objective:     Vitals:   01/22/24 1003  Pulse: 66  SpO2: 100%  Weight: 169 lb (76.7 kg)  Height: 5\' 2"  (1.575 m)      Body mass index is 30.91 kg/m.    Physical Exam:         General: awake, alert, and oriented no acute distress, nontoxic Skin: no suspicious lesions or rashes Neuro:sensation intact distally with no deficits, normal muscle tone, no atrophy, strength 5/5 in all tested lower ext groups Psych: normal mood and affect, speech clear   Left hip: No deformity, swelling or wasting ROM Flexion 90, ext 30, IR 45, ER 45 TTP  left SI joint with positive sphinx NTTP over the hip flexors,  lumbar spine,gluteal musculature, greater trochanter, IT band, Negative log roll with FROM Negative FABER Negative FADIR Negative Piriformis test,  Negative trendelenberg Gait normal    Electronically signed by:  Marshall Skeeter D.[facility] 11:30 AM 01/22/24

## 2024-01-22 ENCOUNTER — Ambulatory Visit: Admitting: Sports Medicine

## 2024-01-22 VITALS — HR 66 | Ht 62.0 in | Wt 169.0 lb

## 2024-01-22 DIAGNOSIS — Z6831 Body mass index (BMI) 31.0-31.9, adult: Secondary | ICD-10-CM | POA: Diagnosis not present

## 2024-01-22 DIAGNOSIS — M5136 Other intervertebral disc degeneration, lumbar region with discogenic back pain only: Secondary | ICD-10-CM

## 2024-01-22 DIAGNOSIS — M533 Sacrococcygeal disorders, not elsewhere classified: Secondary | ICD-10-CM | POA: Diagnosis not present

## 2024-01-22 DIAGNOSIS — M5442 Lumbago with sciatica, left side: Secondary | ICD-10-CM | POA: Diagnosis not present

## 2024-01-22 DIAGNOSIS — Z124 Encounter for screening for malignant neoplasm of cervix: Secondary | ICD-10-CM | POA: Diagnosis not present

## 2024-01-22 DIAGNOSIS — G8929 Other chronic pain: Secondary | ICD-10-CM | POA: Diagnosis not present

## 2024-01-22 DIAGNOSIS — Z1231 Encounter for screening mammogram for malignant neoplasm of breast: Secondary | ICD-10-CM | POA: Diagnosis not present

## 2024-01-22 DIAGNOSIS — L659 Nonscarring hair loss, unspecified: Secondary | ICD-10-CM | POA: Diagnosis not present

## 2024-01-22 DIAGNOSIS — Z01419 Encounter for gynecological examination (general) (routine) without abnormal findings: Secondary | ICD-10-CM | POA: Diagnosis not present

## 2024-01-22 NOTE — Patient Instructions (Signed)
 Epidural L5-S1  Recommend low impact exercises like water aerobics, stationary bike and elliptical  Follow up 2 weeks after epidural to discuss results

## 2024-02-14 ENCOUNTER — Encounter: Payer: Self-pay | Admitting: Sports Medicine

## 2024-02-15 NOTE — Telephone Encounter (Signed)
Gave patient phone number to call

## 2024-02-21 NOTE — Discharge Instructions (Signed)

## 2024-02-25 ENCOUNTER — Ambulatory Visit
Admission: RE | Admit: 2024-02-25 | Discharge: 2024-02-25 | Disposition: A | Discharge status: Home / Self Care | Source: Ambulatory Visit | Attending: Sports Medicine | Admitting: Sports Medicine

## 2024-02-25 DIAGNOSIS — M5127 Other intervertebral disc displacement, lumbosacral region: Secondary | ICD-10-CM | POA: Diagnosis not present

## 2024-02-25 DIAGNOSIS — G8929 Other chronic pain: Secondary | ICD-10-CM

## 2024-02-25 DIAGNOSIS — M5136 Other intervertebral disc degeneration, lumbar region with discogenic back pain only: Secondary | ICD-10-CM

## 2024-02-25 DIAGNOSIS — M47817 Spondylosis without myelopathy or radiculopathy, lumbosacral region: Secondary | ICD-10-CM | POA: Diagnosis not present

## 2024-02-25 DIAGNOSIS — M51369 Other intervertebral disc degeneration, lumbar region without mention of lumbar back pain or lower extremity pain: Secondary | ICD-10-CM | POA: Diagnosis not present

## 2024-02-25 MED ORDER — METHYLPREDNISOLONE ACETATE 40 MG/ML INJ SUSP (RADIOLOG
80.0000 mg | Freq: Once | INTRAMUSCULAR | Status: AC
Start: 2024-02-25 — End: 2024-02-25
  Administered 2024-02-25: 80 mg via EPIDURAL

## 2024-02-25 MED ORDER — IOPAMIDOL (ISOVUE-M 200) INJECTION 41%
1.0000 mL | Freq: Once | INTRAMUSCULAR | Status: AC
  Administered 2024-02-25: 1 mL via EPIDURAL

## 2024-06-10 NOTE — Progress Notes (Unsigned)
 Kelsey [name] [name] [address] [place] [address] Phone: [phone]   Assessment and Plan:     1. Chronic bilateral low back pain with left-sided sciatica (Primary) 2. Degeneration of intervertebral disc of lumbar region with discogenic back pain 3. Chronic left SI joint pain 4. Left hip pain - Chronic with exacerbation, subsequent visit - Recurrent left lower back pain, left SI joint pain, left posterior hip pain..  Most likely lumbar etiology causing patient's pain which improved after epidural CSI, but gradually recurring over the past several weeks - Patient experienced 50% improvement, decrease needed pain medication, improved function after epidural CSI to left-sided L4-L5 on 02/25/2024.  I believe patient would benefit from repeat epidural to left-sided L5-S1.  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  May be experiencing a combination of degenerative changes from low back, irritation of left L5 descending nerve root, and degeneration from partial ankylosis of left SI joint -Continue activity as tolerated including low impact exercise such as stationary bike, elliptical, water aerobics - Continue physical activity as tolerated - Use meloxicam  15 mg daily as needed for breakthrough pain.  Recommend limiting chronic NSAIDs to 1-2 doses per week to prevent long-term side effects. Use Tylenol  500 to 1000 mg tablets 2-3 times a day as needed for day-to-day pain relief.       Pertinent previous records reviewed include epidural  procedure note 02/25/2024   Follow Up: 2 weeks after epidural to review benefit.  Could consider SI joint CSI versus repeat epidural   Subjective:   I, Kelsey [name], am serving as a Neurosurgeon for Doctor Morene Mace  Chief Complaint: neck and shoulder pain    HPI:    09/26/2022 Patient is a 48 year old female complaining of neck and shoulder pain. Patient states has been going on for months , she has flares out of no where, neck and shoulders tighten and dont let go, thinks it might be stress related, sometimes she does have numbness and tingling down to her fingers, no meds for the pain , upper trap pain, at rest she has a little pain , decreased ROM due to pain notes she felt a crack yesterday that was painful and then it felt good    10/17/2022 Patient states that she is okay    11/07/2022 Patient states she is okay    12/05/2022 Patient states she is good ,better    12/28/2022 Patient states moderate management of neck and upper back sx, continues to have periscapular pain. Notes flare-up of lower back pain, bilateral. Pain at posterior aspect of the hips/SI joint. Denies sharp shooting pain, n/t, weakness in B LE. Sx x 4 days. Has tried HEP and TheraGun. Scheduled for surgery on Monday.    10/16/2023 Patient states low back , hip mostly left hip pain flare . She is not able to get out of bed . She was not able to get out of car today. Was seen by chiro and that helped some . Is interested in OMT   11/27/2023 Patient states took meloxicam  today because the hip has been hurting. Pain is  noticeable but not terrible. Pain has been consistent even with massage therapy and dry needling.    12/11/2023 Patient states she has completed the prednisone . It was helping until the wasn't anymore medication to take. Does not feel that well today. First thing in the morning it is bad to the point where she can not get up (left near butt). Been stretching and trying to strengthening glutes via body  weight exercises. However, no change has been witnessed.    01/08/2024 Only minimal relief after SI joint CSI at previous office visit.  Patient still experiencing significant low back pain, pain into left posterior hip on a daily basis that will flare and limit day-to-day activities.   01/22/2024 Patient states she is the same. Hasn't been able to do her ADLs   06/11/2024 Patient states pain came back  SI low back ,in august but the pain never really subsided. Meloxicam  has helped    Relevant Historical Information: None pertinent  Additional pertinent review of systems negative.   Current Outpatient Medications:    EPINEPHrine  0.3 mg/0.3 mL IJ SOAJ injection, Inject 0.3 mg into the muscle as needed for anaphylaxis., Disp: 1 each, Rfl: 0   escitalopram (LEXAPRO) 10 MG tablet, Take 10 mg by mouth daily., Disp: , Rfl:    meloxicam  (MOBIC ) 15 MG tablet, Take 1 tablet (15 mg total) by mouth daily as needed for pain., Disp: 30 tablet, Rfl: 0   methylPREDNISolone  (MEDROL  DOSEPAK) 4 MG TBPK tablet, Take 6 tablets on day 1.  Take 5 tablets on day 2.  Take 4 tablets on day 3.  Take 3 tablets on day 4.  Take 2 tablets on day 5.  Take 1 tablet on day 6., Disp: 21 tablet, Rfl: 0   minoxidil (LONITEN) 2.5 MG tablet, Take 2.5 mg by mouth daily. PATIENT TAKES 1/2 TABLET DAILY, Disp: , Rfl:    tirzepatide  (MOUNJARO ) 5 MG/0.5ML Pen, Inject 5 mg into the skin once a week., Disp: , Rfl:    Objective:     Vitals:   06/11/24 1038  BP: 118/82  Pulse: 78  SpO2: 99%  Weight: 160 lb (72.6 kg)  Height: 5' 2 (1.575 m)      Body mass index is 29.26 kg/m.    Physical Exam:    General: awake, alert, and oriented no acute distress, nontoxic Skin: no suspicious lesions or rashes Neuro:sensation intact distally with no deficits, normal muscle tone, no atrophy, strength 5/5 in all tested lower ext groups Psych: normal mood and affect, speech clear   Left hip: No deformity, swelling or wasting ROM Flexion  90, ext 30, IR 45, ER 45 TTP  left SI joint with positive sphinx NTTP over the hip flexors,  lumbar spine,gluteal musculature, greater trochanter, IT band, Negative log roll with FROM Negative FABER Negative FADIR Negative Piriformis test,  Negative trendelenberg Gait normal     Electronically signed by:  Odis Mace D.CLEMENTEEN [name] 11:07 AM 06/11/24

## 2024-06-11 ENCOUNTER — Ambulatory Visit (INDEPENDENT_AMBULATORY_CARE_PROVIDER_SITE_OTHER): Admitting: Sports Medicine

## 2024-06-11 VITALS — BP 118/82 | HR 78 | Ht 62.0 in | Wt 160.0 lb

## 2024-06-11 DIAGNOSIS — M5442 Lumbago with sciatica, left side: Secondary | ICD-10-CM

## 2024-06-11 DIAGNOSIS — M533 Sacrococcygeal disorders, not elsewhere classified: Secondary | ICD-10-CM

## 2024-06-11 DIAGNOSIS — M5136 Other intervertebral disc degeneration, lumbar region with discogenic back pain only: Secondary | ICD-10-CM

## 2024-06-11 DIAGNOSIS — M25552 Pain in left hip: Secondary | ICD-10-CM

## 2024-06-11 DIAGNOSIS — G8929 Other chronic pain: Secondary | ICD-10-CM

## 2024-06-11 MED ORDER — MELOXICAM 15 MG PO TABS: 15.0000 mg | ORAL_TABLET | Freq: Every day | ORAL | 0 refills | Status: DC | PRN

## 2024-06-11 NOTE — Patient Instructions (Signed)
 Repeat epidural left sided L5 -S1 - Use meloxicam  15 mg daily as needed for breakthrough pain.  Recommend limiting chronic NSAIDs to 1-2 doses per week to prevent long-term side effects. Use Tylenol  500 to 1000 mg tablets 2-3 times a day as needed for day-to-day pain relief.    Follow up 2 weeks after injection.

## 2024-06-23 NOTE — Discharge Instructions (Signed)

## 2024-06-25 ENCOUNTER — Inpatient Hospital Stay
Admission: RE | Admit: 2024-06-25 | Discharge: 2024-06-25 | Disposition: A | Source: Ambulatory Visit | Attending: Sports Medicine

## 2024-06-25 DIAGNOSIS — M5126 Other intervertebral disc displacement, lumbar region: Secondary | ICD-10-CM | POA: Diagnosis not present

## 2024-06-25 DIAGNOSIS — M5136 Other intervertebral disc degeneration, lumbar region with discogenic back pain only: Secondary | ICD-10-CM

## 2024-06-25 DIAGNOSIS — M545 Low back pain, unspecified: Secondary | ICD-10-CM | POA: Diagnosis not present

## 2024-06-25 DIAGNOSIS — G8929 Other chronic pain: Secondary | ICD-10-CM

## 2024-06-25 MED ORDER — METHYLPREDNISOLONE ACETATE 40 MG/ML INJ SUSP (RADIOLOG
80.0000 mg | Freq: Once | INTRAMUSCULAR | Status: AC
  Administered 2024-06-25: 80 mg via EPIDURAL

## 2024-06-25 MED ORDER — IOPAMIDOL (ISOVUE-M 200) INJECTION 41%
1.0000 mL | Freq: Once | INTRAMUSCULAR | Status: AC
  Administered 2024-06-25: 1 mL via EPIDURAL

## 2024-06-26 ENCOUNTER — Ambulatory Visit: Payer: Self-pay | Admitting: Sports Medicine

## 2024-06-27 NOTE — Telephone Encounter (Signed)
 I called and spoke with Kelsey [name] She had a L5-S1 interlaminar epidural steroid injection on October 8.  In June she had an interlaminar epidural steroid injection at L4-5.  This most recent injection started her leg up and now she is having pain radiating down her leg.  She already has taken meloxicam .  She is not having weakness.  No fevers or chills.  We talked about options.  Recommend using meloxicam  as needed.  She does have gabapentin  that she can take.  Recommend taking 300-600 up to 3 times daily as needed.  I did talk about pain medication in which she did declined noting that previous pain medicine made her itchy.  I did provide her with my contact information so she we can talk over the weekend if she worsens.

## 2024-06-30 NOTE — Progress Notes (Unsigned)
 Ben Jackson D.[facility] [address] [place] [address] Phone: [phone]   Assessment and Plan:     1. Chronic bilateral low back pain with left-sided sciatica (Primary) 2. Degeneration of intervertebral disc of lumbar region with discogenic back pain 3. Chronic left SI joint pain 4. Left hip pain - Chronic with exacerbation, subsequent visit - Patient had significant flare of low back pain radiating into posterior left hip and left thigh after epidural CSI on 06/25/2024.  Likely a side effect from procedure.  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  Recommend limiting chronic NSAIDs to 1-2 doses per week to prevent long-term side effects. Use Tylenol  500 to 1000 mg tablets 2-3 times a day as needed for day-to-day pain relief.    -Continue gabapentin  300 mg daily as needed for radicular symptoms    Pertinent previous records reviewed include epidural procedure note 06/25/2024   Follow Up: 4 weeks for reevaluation.  If continued localized pain, could consider SI joint CSI.   If returning radicular symptoms, could discuss repeat epidural versus neurosurgery referral   Subjective:   I, Cherell Colvin, am serving as a Neurosurgeon for Doctor Morene Mace  Chief Complaint: neck and shoulder pain    HPI:    09/26/2022 Patient is a 48 year old female complaining of neck and shoulder pain. Patient states has been going on for months , she has flares out of no where, neck and shoulders tighten and dont let go, thinks it might be stress related, sometimes she does have numbness and tingling down to her fingers, no meds for the pain , upper trap pain, at rest she has a little pain , decreased ROM due to pain notes she felt a crack yesterday that was painful and then it felt good    10/17/2022 Patient states that she is okay    11/07/2022 Patient states she is okay    12/05/2022 Patient states she is good ,better    12/28/2022 Patient states moderate management of neck and upper back sx, continues to have periscapular pain. Notes flare-up of lower back pain, bilateral. Pain at posterior aspect of the hips/SI joint. Denies sharp shooting pain, n/t, weakness in B LE. Sx x 4 days. Has tried HEP and TheraGun. Scheduled for surgery on Monday.    10/16/2023 Patient states low back , hip mostly left hip pain flare . She is not able to get out of bed . She was not able to get out of car today. Was seen by chiro and that helped some . Is interested in OMT   11/27/2023 Patient states took meloxicam  today because the hip has been hurting. Pain is noticeable but not terrible. Pain  has been consistent even with massage therapy and dry needling.    12/11/2023 Patient states she has completed the prednisone . It was helping until the wasn't anymore medication to take. Does not feel that well today. First thing in the morning it is bad to the point where she can not get up (left near butt). Been stretching and trying to strengthening glutes via body weight exercises. However, no change has  been witnessed.    01/08/2024 Only minimal relief after SI joint CSI at previous office visit.  Patient still experiencing significant low back pain, pain into left posterior hip on a daily basis that will flare and limit day-to-day activities.   01/22/2024 Patient states she is the same. Hasn't been able to do her ADLs    06/11/2024 Patient states pain came back  SI low back ,in august but the pain never really subsided. Meloxicam  has helped   07/01/2024 Patient states she had a rough few days after the epidural. Her leg was heavy. But today is a good day.   Relevant Historical Information: None pertinent  Additional pertinent review of systems negative.   Current Outpatient Medications:    EPINEPHrine  0.3 mg/0.3 mL IJ SOAJ injection, Inject 0.3 mg into the muscle as needed for anaphylaxis., Disp: 1 each, Rfl: 0   escitalopram (LEXAPRO) 10 MG tablet, Take 10 mg by mouth daily., Disp: , Rfl:    meloxicam  (MOBIC ) 15 MG tablet, Take 1 tablet (15 mg total) by mouth daily as needed for pain., Disp: 30 tablet, Rfl: 0   methylPREDNISolone  (MEDROL  DOSEPAK) 4 MG TBPK tablet, Take 6 tablets on day 1.  Take 5 tablets on day 2.  Take 4 tablets on day 3.  Take 3 tablets on day 4.  Take 2 tablets on day 5.  Take 1 tablet on day 6., Disp: 21 tablet, Rfl: 0   minoxidil (LONITEN) 2.5 MG tablet, Take 2.5 mg by mouth daily. PATIENT TAKES 1/2 TABLET DAILY, Disp: , Rfl:    tirzepatide  (MOUNJARO ) 5 MG/0.5ML Pen, Inject 5 mg into the skin once a week., Disp: , Rfl:    Objective:     Vitals:   07/01/24 1028  BP: 110/84  Pulse: 84  SpO2: 96%  Weight: 162 lb (73.5 kg)  Height: 5' 2 (1.575 m)      Body mass index is 29.63 kg/m.    Physical Exam:    General: awake, alert, and oriented no acute distress, nontoxic Skin: no suspicious lesions or rashes Neuro:sensation intact distally with no deficits, normal muscle tone, no atrophy, strength 5/5 in all tested lower ext groups Psych: normal mood and  affect, speech clear   Left hip: No deformity, swelling or wasting ROM Flexion 90, ext 30, IR 45, ER 45 TTP  left SI joint with positive sphinx NTTP over the hip flexors,  lumbar spine,gluteal musculature, greater trochanter, IT band, Negative log roll with FROM Negative FABER Negative FADIR Negative Piriformis test,  Negative trendelenberg Gait normal    Electronically signed by:  Odis Mace [facility] 10:46 AM 07/01/24

## 2024-07-01 ENCOUNTER — Ambulatory Visit: Admitting: Sports Medicine

## 2024-07-01 VITALS — BP 110/84 | HR 84 | Ht 62.0 in | Wt 162.0 lb

## 2024-07-01 DIAGNOSIS — M5136 Other intervertebral disc degeneration, lumbar region with discogenic back pain only: Secondary | ICD-10-CM | POA: Diagnosis not present

## 2024-07-01 DIAGNOSIS — G8929 Other chronic pain: Secondary | ICD-10-CM

## 2024-07-01 DIAGNOSIS — M25552 Pain in left hip: Secondary | ICD-10-CM | POA: Diagnosis not present

## 2024-07-01 DIAGNOSIS — M5442 Lumbago with sciatica, left side: Secondary | ICD-10-CM

## 2024-07-01 DIAGNOSIS — M533 Sacrococcygeal disorders, not elsewhere classified: Secondary | ICD-10-CM | POA: Diagnosis not present

## 2024-07-01 NOTE — Patient Instructions (Signed)
-   Use meloxicam  15 mg daily as needed for breakthrough pain.  Recommend limiting chronic NSAIDs to 1-2 doses per week to prevent long-term side effects. Use Tylenol  500 to 1000 mg tablets 2-3 times a day as needed for day-to-day pain relief.    Can call if you need refill   Gabapentin  300 mg daily as needed   Advance activity as tolerated   4 week follow up

## 2024-07-23 NOTE — Progress Notes (Signed)
 Ben Tre Sanker D.[facility] [address] [place] [address] Phone: [phone]   Assessment and Plan:     1. Chronic bilateral low back pain with left-sided sciatica (Primary) 2. Degeneration of intervertebral disc of lumbar region with discogenic back pain 3. Chronic left SI joint pain -Chronic with exacerbation, subsequent visit - Overall improvement in left-sided radicular symptoms, however patient continues to experience left-sided low back pain along left lower lumbar spine/left SI joint.  Encouraging that patient is no longer experiencing radicular symptoms, though still daily pain - Start meloxicam  15 mg daily x2 weeks.  If still having pain after 2 weeks, complete 3rd-week of NSAID. May use remaining NSAID as needed once daily for pain control.  Do not to use additional over-the-counter NSAIDs (ibuprofen , naproxen, Advil , Aleve, etc.) while taking prescription NSAIDs.  May use Tylenol  [phone] mg 2 to 3 times a day for breakthrough pain. - Patient previously received 50% improvement from epidural CSI in June 2025, however most recent epidural on 06/25/2024 flared symptoms rather than improving. 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  If continued localized pain, could consider SI joint injection.   If returning radicular symptoms, could discuss repeat epidural versus neurosurgery referral   Subjective:   I, Kelsey [name], am serving as a neurosurgeon for Doctor Morene Mace  Chief Complaint: neck and shoulder pain    HPI:    09/26/2022 Patient is a 48 year old female complaining of neck and shoulder pain. Patient states has been going on for months , she has flares out of no where, neck and shoulders tighten and dont let go, thinks it might be stress related, sometimes she does have numbness and tingling down to her fingers, no meds for the pain , upper trap pain, at rest she has a little pain , decreased ROM due to pain notes she felt a crack yesterday that was painful and then it felt good    10/17/2022 Patient states that she is okay    11/07/2022 Patient states she is okay    12/05/2022 Patient states she is good ,better    12/28/2022 Patient states moderate management of neck and upper back sx, continues to have periscapular pain. Notes flare-up of lower back pain, bilateral. Pain at posterior aspect of the hips/SI joint. Denies sharp shooting pain, n/t, weakness in B LE. Sx x 4 days. Has tried HEP and TheraGun. Scheduled for surgery on Monday.    10/16/2023 Patient states low back , hip mostly left hip pain flare . She is not able to get out of bed . She was not able to get out of car today. Was seen by chiro and that helped some . Is interested in OMT   11/27/2023 Patient states took meloxicam  today because the hip has been hurting. Pain is noticeable but not terrible. Pain has been consistent even with massage therapy and dry needling.  12/11/2023 Patient states she has completed the prednisone . It was helping until the wasn't anymore medication to take. Does not feel that well today. First thing in the morning it is bad to the point where she can not get up (left near butt). Been stretching and trying to strengthening glutes via body weight exercises. However, no change has  been witnessed.    01/08/2024 Only minimal relief after SI joint CSI at previous office visit.  Patient still experiencing significant low back pain, pain into left posterior hip on a daily basis that will flare and limit day-to-day activities.   01/22/2024 Patient states she is the same. Hasn't been able to do her ADLs    06/11/2024 Patient states pain came back  SI low back ,in august but the pain never really subsided. Meloxicam  has helped    07/01/2024 Patient states she had a rough few days after the epidural. Her leg was heavy. But today is a good day.   07/29/2024 Patient states she is the same    Relevant Historical Information: None pertinent    Additional pertinent review of systems negative.   Current Outpatient Medications:    EPINEPHrine  0.3 mg/0.3 mL IJ SOAJ injection, Inject 0.3 mg into the muscle as needed for anaphylaxis., Disp: 1 each, Rfl: 0   escitalopram (LEXAPRO) 10 MG tablet, Take 10 mg by mouth daily., Disp: , Rfl:    meloxicam  (MOBIC ) 15 MG tablet, Take 1 tablet (15 mg total) by mouth daily as needed for pain., Disp: 30 tablet, Rfl: 0   methylPREDNISolone  (MEDROL  DOSEPAK) 4 MG TBPK tablet, Take 6 tablets on day 1.  Take 5 tablets on day 2.  Take 4 tablets on day 3.  Take 3 tablets on day 4.  Take 2 tablets on day 5.  Take 1 tablet on day 6., Disp: 21 tablet, Rfl: 0   minoxidil (LONITEN) 2.5 MG tablet, Take 2.5 mg by mouth daily. PATIENT TAKES 1/2 TABLET DAILY, Disp: , Rfl:    tirzepatide  (MOUNJARO ) 5 MG/0.5ML Pen, Inject 5 mg into the skin once a week., Disp: , Rfl:    Objective:     Vitals:   07/29/24 1028  BP: 124/82  Pulse: 82  SpO2: 99%  Weight: 160 lb (72.6 kg)  Height: 5' 2 (1.575 m)      Body mass index is 29.26 kg/m.    Physical Exam:    General: awake, alert, and oriented no acute distress, nontoxic Skin: no suspicious lesions or rashes Neuro:sensation intact distally with no deficits, normal muscle tone, no atrophy, strength 5/5 in all  tested lower ext groups Psych: normal mood and affect, speech clear   Left hip: No deformity, swelling or wasting ROM Flexion 90, ext 30, IR 45, ER 45 TTP  left SI joint with positive sphinx NTTP over the hip flexors,  lumbar spine,gluteal musculature, greater trochanter, IT band, Negative log roll with FROM Negative FABER Negative FADIR Negative Piriformis test,  Negative trendelenberg Gait normal     Electronically signed by:  Odis Mace [facility] 10:42 AM 07/29/24

## 2024-07-29 ENCOUNTER — Ambulatory Visit: Admitting: Sports Medicine

## 2024-07-29 VITALS — BP 124/82 | HR 82 | Ht 62.0 in | Wt 160.0 lb

## 2024-07-29 DIAGNOSIS — G8929 Other chronic pain: Secondary | ICD-10-CM | POA: Diagnosis not present

## 2024-07-29 DIAGNOSIS — M5136 Other intervertebral disc degeneration, lumbar region with discogenic back pain only: Secondary | ICD-10-CM | POA: Diagnosis not present

## 2024-07-29 DIAGNOSIS — M5442 Lumbago with sciatica, left side: Secondary | ICD-10-CM

## 2024-07-29 DIAGNOSIS — M533 Sacrococcygeal disorders, not elsewhere classified: Secondary | ICD-10-CM

## 2024-07-29 MED ORDER — MELOXICAM 15 MG PO TABS: 15.0000 mg | ORAL_TABLET | Freq: Every day | ORAL | 0 refills | Status: DC | PRN

## 2024-07-29 NOTE — Patient Instructions (Signed)
-   Start meloxicam  15 mg daily x2 weeks.  If still having pain after 2 weeks, complete 3rd-week of NSAID. May use remaining NSAID as needed once daily for pain control.  Do not to use additional over-the-counter NSAIDs (ibuprofen , naproxen, Advil , Aleve, etc.) while taking prescription NSAIDs.  May use Tylenol  [phone] mg 2 to 3 times a day for breakthrough pain.  Physical activity as tolerated   4 week follow up

## 2024-08-25 NOTE — Progress Notes (Deleted)
 Ben Jackson [facility] [address] [place] [address] Phone: [phone]   Assessment and Plan:     *** - Patient has received relief with OMT in the past.  Elects for repeat OMT today.  Tolerated well per note below. - Decision today to treat with OMT was based on Physical Exam   After verbal consent patient was treated with HVLA (high velocity low amplitude), ME (muscle energy), FPR (flex positional release), ST (soft tissue), PC/PD (Pelvic Compression/ Pelvic Decompression) techniques in cervical, rib, thoracic, lumbar, and pelvic areas. Patient tolerated the procedure well with improvement in symptoms.  Patient educated on potential side effects of soreness and recommended to rest, hydrate, and use Tylenol  as needed for pain control.   Pertinent previous records reviewed include ***    Follow Up: ***     Subjective:   I, Elim Peale, am serving as a neurosurgeon for Doctor Morene Mace  Chief Complaint: neck and shoulder pain    HPI:    09/26/2022 Patient is a 48 year old female complaining of neck and shoulder pain. Patient states has been going on for months , she has flares out of no where, neck and shoulders tighten and dont let go, thinks it might be stress related, sometimes she does have numbness and tingling down to her fingers, no meds for the pain , upper trap pain, at rest she has a little pain , decreased ROM due to pain notes she felt a crack yesterday that was painful and then it felt good    10/17/2022 Patient states that she is okay    11/07/2022 Patient states she is okay    12/05/2022 Patient states she is good ,better    12/28/2022 Patient states moderate management of neck and upper back sx, continues to have periscapular pain. Notes flare-up of lower back pain, bilateral. Pain at posterior aspect of the hips/SI joint. Denies sharp shooting pain, n/t, weakness in B LE. Sx x 4 days. Has tried HEP and TheraGun. Scheduled for  surgery on Monday.    10/16/2023 Patient states low back , hip mostly left hip pain flare . She is not able to get out of bed . She was not able to get out of car today. Was seen by chiro and that helped some . Is interested in OMT   11/27/2023 Patient states took meloxicam  today because the hip has been hurting. Pain is noticeable but not terrible. Pain has been consistent even with massage therapy and dry needling.    12/11/2023 Patient states she has completed the prednisone . It was helping until the wasn't anymore medication to take. Does not feel that well today. First thing in the morning it is bad to the point where she can not get up (left near butt). Been stretching and trying to strengthening glutes via body weight exercises. However, no change has been witnessed.    01/08/2024 Only minimal relief after SI joint CSI at previous office visit.  Patient still experiencing significant low back pain, pain into left posterior hip on a daily basis that will flare and limit day-to-day activities.   01/22/2024 Patient states she is the same. Hasn't been able to do her ADLs    06/11/2024 Patient states pain came back  SI low back ,in august but the pain never really subsided. Meloxicam  has helped    07/01/2024 Patient states she had a rough few days after the epidural. Her leg was heavy. But today is  a good day.    07/29/2024 Patient states she is the same   08/26/2024 Patient states  Relevant Historical Information: None pertinent  Additional pertinent review of systems negative.  Current Outpatient Medications  Medication Sig Dispense Refill   EPINEPHrine  0.3 mg/0.3 mL IJ SOAJ injection Inject 0.3 mg into the muscle as needed for anaphylaxis. 1 each 0   escitalopram (LEXAPRO) 10 MG tablet Take 10 mg by mouth daily.     meloxicam  (MOBIC ) 15 MG tablet Take 1 tablet (15 mg total) by mouth daily as needed for pain. 30 tablet 0   methylPREDNISolone  (MEDROL  DOSEPAK) 4 MG TBPK tablet Take 6  tablets on day 1.  Take 5 tablets on day 2.  Take 4 tablets on day 3.  Take 3 tablets on day 4.  Take 2 tablets on day 5.  Take 1 tablet on day 6. 21 tablet 0   minoxidil (LONITEN) 2.5 MG tablet Take 2.5 mg by mouth daily. PATIENT TAKES 1/2 TABLET DAILY     tirzepatide  (MOUNJARO ) 5 MG/0.5ML Pen Inject 5 mg into the skin once a week.     No current facility-administered medications for this visit.      Objective:     There were no vitals filed for this visit.    There is no height or weight on file to calculate BMI.    Physical Exam:     General: Well-appearing, cooperative, sitting comfortably in no acute distress.   OMT Physical Exam:  ASIS Compression Test: Positive Right Cervical: TTP paraspinal, *** Rib: Bilateral elevated first rib with TTP Thoracic: TTP paraspinal,*** Lumbar: TTP paraspinal,*** Pelvis: Right anterior innominate  Electronically signed by:  Odis Mace [facility] 7:28 AM 08/25/24

## 2024-08-26 ENCOUNTER — Ambulatory Visit: Admitting: Sports Medicine

## 2024-08-26 NOTE — Progress Notes (Unsigned)
 Ben Jackson [facility] [address] [place] [address] Phone: [phone]   Assessment and Plan:     *** - Patient has received relief with OMT in the past.  Elects for repeat OMT today.  Tolerated well per note below. - Decision today to treat with OMT was based on Physical Exam   After verbal consent patient was treated with HVLA (high velocity low amplitude), ME (muscle energy), FPR (flex positional release), ST (soft tissue), PC/PD (Pelvic Compression/ Pelvic Decompression) techniques in cervical, rib, thoracic, lumbar, and pelvic areas. Patient tolerated the procedure well with improvement in symptoms.  Patient educated on potential side effects of soreness and recommended to rest, hydrate, and use Tylenol  as needed for pain control.   Pertinent previous records reviewed include ***    Follow Up: ***     Subjective:   I, Kelsey [name], am serving as a neurosurgeon for Doctor Morene Mace  Chief Complaint: neck and shoulder pain    HPI:    09/26/2022 Patient is a 48 year old female complaining of neck and shoulder pain. Patient states has been going on for months , she has flares out of no where, neck and shoulders tighten and dont let go, thinks it might be stress related, sometimes she does have numbness and tingling down to her fingers, no meds for the pain , upper trap pain, at rest she has a little pain , decreased ROM due to pain notes she felt a crack yesterday that was painful and then it felt good    10/17/2022 Patient states that she is okay    11/07/2022 Patient states she is okay    12/05/2022 Patient states she is good ,better    12/28/2022 Patient states moderate management of neck and upper back sx, continues to have periscapular pain. Notes flare-up of lower back pain, bilateral. Pain at posterior aspect of the hips/SI joint. Denies sharp shooting pain, n/t, weakness in B LE. Sx x 4 days. Has tried HEP and TheraGun. Scheduled for  surgery on Monday.    10/16/2023 Patient states low back , hip mostly left hip pain flare . She is not able to get out of bed . She was not able to get out of car today. Was seen by chiro and that helped some . Is interested in OMT   11/27/2023 Patient states took meloxicam  today because the hip has been hurting. Pain is noticeable but not terrible. Pain has been consistent even with massage therapy and dry needling.    12/11/2023 Patient states she has completed the prednisone . It was helping until the wasn't anymore medication to take. Does not feel that well today. First thing in the morning it is bad to the point where she can not get up (left near butt). Been stretching and trying to strengthening glutes via body weight exercises. However, no change has been witnessed.    01/08/2024 Only minimal relief after SI joint CSI at previous office visit.  Patient still experiencing significant low back pain, pain into left posterior hip on a daily basis that will flare and limit day-to-day activities.   01/22/2024 Patient states she is the same. Hasn't been able to do her ADLs    06/11/2024 Patient states pain came back  SI low back ,in august but the pain never really subsided. Meloxicam  has helped    07/01/2024 Patient states she had a rough few days after the epidural. Her leg was heavy. But today is  a good day.    07/29/2024 Patient states she is the same   08/27/2024 Patient states  Relevant Historical Information: None pertinent  Additional pertinent review of systems negative.  Current Outpatient Medications  Medication Sig Dispense Refill   EPINEPHrine  0.3 mg/0.3 mL IJ SOAJ injection Inject 0.3 mg into the muscle as needed for anaphylaxis. 1 each 0   escitalopram (LEXAPRO) 10 MG tablet Take 10 mg by mouth daily.     meloxicam  (MOBIC ) 15 MG tablet Take 1 tablet (15 mg total) by mouth daily as needed for pain. 30 tablet 0   methylPREDNISolone  (MEDROL  DOSEPAK) 4 MG TBPK tablet Take 6  tablets on day 1.  Take 5 tablets on day 2.  Take 4 tablets on day 3.  Take 3 tablets on day 4.  Take 2 tablets on day 5.  Take 1 tablet on day 6. 21 tablet 0   minoxidil (LONITEN) 2.5 MG tablet Take 2.5 mg by mouth daily. PATIENT TAKES 1/2 TABLET DAILY     tirzepatide  (MOUNJARO ) 5 MG/0.5ML Pen Inject 5 mg into the skin once a week.     No current facility-administered medications for this visit.      Objective:     There were no vitals filed for this visit.    There is no height or weight on file to calculate BMI.    Physical Exam:     General: Well-appearing, cooperative, sitting comfortably in no acute distress.   OMT Physical Exam:  ASIS Compression Test: Positive Right Cervical: TTP paraspinal, *** Rib: Bilateral elevated first rib with TTP Thoracic: TTP paraspinal,*** Lumbar: TTP paraspinal,*** Pelvis: Right anterior innominate  Electronically signed by:  Odis Mace [facility] 9:30 AM 08/26/24

## 2024-08-27 ENCOUNTER — Ambulatory Visit: Admitting: Sports Medicine

## 2024-08-27 ENCOUNTER — Other Ambulatory Visit: Payer: Self-pay

## 2024-08-27 VITALS — BP 102/60 | HR 77 | Ht 62.0 in | Wt 168.2 lb

## 2024-08-27 DIAGNOSIS — M5442 Lumbago with sciatica, left side: Secondary | ICD-10-CM

## 2024-08-27 DIAGNOSIS — M5136 Other intervertebral disc degeneration, lumbar region with discogenic back pain only: Secondary | ICD-10-CM | POA: Diagnosis not present

## 2024-08-27 DIAGNOSIS — G8929 Other chronic pain: Secondary | ICD-10-CM | POA: Diagnosis not present

## 2024-08-27 DIAGNOSIS — M533 Sacrococcygeal disorders, not elsewhere classified: Secondary | ICD-10-CM | POA: Diagnosis not present

## 2024-08-27 MED ORDER — MELOXICAM 15 MG PO TABS: 15.0000 mg | ORAL_TABLET | ORAL | 0 refills | Status: AC | PRN

## 2024-08-27 NOTE — Patient Instructions (Signed)
-   Use meloxicam  15 mg daily as needed for breakthrough pain.  Recommend limiting chronic NSAIDs to 1-2 doses per week to prevent long-term side effects. Use Tylenol  500 to 1000 mg tablets 2-3 times a day as needed for day-to-day pain relief.    Injected SI today.   Follow up in 4 weeks.

## 2024-09-05 ENCOUNTER — Telehealth: Admitting: Physician Assistant

## 2024-09-05 DIAGNOSIS — J101 Influenza due to other identified influenza virus with other respiratory manifestations: Secondary | ICD-10-CM

## 2024-09-05 MED ORDER — OSELTAMIVIR PHOSPHATE 75 MG PO CAPS: 75.0000 mg | ORAL_CAPSULE | Freq: Two times a day (BID) | ORAL | 0 refills | Status: DC

## 2024-09-05 MED ORDER — FLUTICASONE PROPIONATE 50 MCG/ACT NA SUSP: 2.0000 | Freq: Every day | NASAL | 0 refills | Status: AC

## 2024-09-05 MED ORDER — NAPROXEN 500 MG PO TABS: 500.0000 mg | ORAL_TABLET | Freq: Two times a day (BID) | ORAL | 0 refills | Status: DC

## 2024-09-05 MED ORDER — BENZONATATE 100 MG PO CAPS: 100.0000 mg | ORAL_CAPSULE | Freq: Three times a day (TID) | ORAL | 0 refills | Status: DC | PRN

## 2024-09-05 NOTE — Progress Notes (Signed)
 E visit for Flu like symptoms   We are sorry that you are not feeling well.  Here is how we plan to help! Based on what you have shared with me it looks like you may have a respiratory virus that may be influenza.  Influenza or the flu is  an infection caused by a respiratory virus. The flu virus is highly contagious and persons who did not receive their yearly flu vaccination may catch the flu from close contact.  We have anti-viral medications to treat the viruses that cause this infection. They are not a cure and only shorten the course of the infection. These prescriptions are most effective when they are given within the first 2 days of flu symptoms. Antiviral medications are indicated if you have a high risk of complications from the flu. You should  also consider an antiviral medication if you are in close contact with someone who is at risk. These medications can help patients avoid complications from the flu but have side effects that you should know.   Possible side effects from Tamiflu  or oseltamivir  include nausea, vomiting, diarrhea, dizziness, headaches, eye redness, sleep problems or other respiratory symptoms. You should not take Tamiflu  if you have an allergy to oseltamivir  or any to the ingredients in Tamiflu .  Based upon your symptoms and potential risk factors I have prescribed Oseltamivir  (Tamiflu ).  It has been sent to your designated pharmacy.  You will take one 75 mg capsule orally twice a day for the next 5 days.   For nasal congestion, you may use an oral decongestant such as Mucinex D or if you have glaucoma or high blood pressure use plain Mucinex.  Saline nasal spray or nasal drops can help and can safely be used as often as needed for congestion.  If you have a sore or scratchy throat, use a saltwater gargle-  to  teaspoon of salt dissolved in a 4-ounce to 8-ounce glass of warm water.  Gargle the solution for approximately 15-30 seconds and then spit.  It is  important not to swallow the solution.  You can also use throat lozenges/cough drops and Chloraseptic spray to help with throat pain or discomfort.  Warm or cold liquids can also be helpful in relieving throat pain.  For headache, pain or general discomfort, you can use Ibuprofen  or Tylenol  as directed.   Some authorities believe that zinc sprays or the use of Echinacea may shorten the course of your symptoms.  I have prescribed the following medications to help lessen symptoms: I have prescribed Tessalon  Perles 100 mg. You may take 1-2 capsules every 8 hours as needed for cough, I have prescribed an anti-inflammatory - Naprosyn  500 mg. Take twice daily as needed for fever or body aches for 2 weeks, and I have prescribed Fluticasone  nasal spray 2 sprays in each nostril one time per dayasal spray 2 sprays in each nostril one time per day  You are to isolate at home until you have been fever-free for at least 24 hours without a fever-reducing medication, and symptoms have been steadily improving for 24 hours.  If you must be around other household members who do not have symptoms, you need to make sure that both you and the family members are masking consistently with a high-quality mask.  If you note any worsening of symptoms despite treatment, please seek an in-person evaluation ASAP. If you note any significant shortness of breath or any chest pain, please seek ED evaluation. Please do not  delay care!  ANYONE WHO HAS FLU SYMPTOMS SHOULD: Stay home. The flu is highly contagious and going out or to work exposes others! Be sure to drink plenty of fluids. Water is fine as well as fruit juices, sodas and electrolyte beverages. You may want to stay away from caffeine or alcohol. If you are nauseated, try taking small sips of liquids. How do you know if you are getting enough fluid? Your urine should be a pale yellow or almost colorless. Get rest. Taking a steamy shower or using a humidifier may help nasal  congestion and ease sore throat pain. Using a saline nasal spray works much the same way. Cough drops, hard candies and sore throat lozenges may ease your cough. Line up a caregiver. Have someone check on you regularly.  GET HELP RIGHT AWAY IF: You cannot keep down liquids or your medications. You become short of breath Your fell like you are going to pass out or loose consciousness. Your symptoms persist after you have completed your treatment plan  MAKE SURE YOU  Understand these instructions. Will watch your condition. Will get help right away if you are not doing well or get worse.  Your e-visit answers were reviewed by a board certified advanced clinical practitioner to complete your personal care plan.  Depending on the condition, your plan could have included both over the counter or prescription medications.  If there is a problem please reply  once you have received a response from your provider.  Your safety is important to us .  If you have drug allergies check your prescription carefully.    You can use MyChart to ask questions about todays visit, request a non-urgent call back, or ask for a work or school excuse for 24 hours related to this e-Visit. If it has been greater than 24 hours you will need to follow up with your provider, or enter a new e-Visit to address those concerns.  You will get an e-mail in the next two days asking about your experience.  I hope that your e-visit has been valuable and will speed your recovery. Thank you for using e-visits.   I have spent 5 minutes in review of e-visit questionnaire, review and updating patient chart, medical decision making and response to patient.   Delon CHRISTELLA Dickinson, PA-C

## 2024-09-16 ENCOUNTER — Telehealth: Admitting: Family Medicine

## 2024-09-16 DIAGNOSIS — J069 Acute upper respiratory infection, unspecified: Secondary | ICD-10-CM | POA: Diagnosis not present

## 2024-09-16 DIAGNOSIS — J101 Influenza due to other identified influenza virus with other respiratory manifestations: Secondary | ICD-10-CM

## 2024-09-16 MED ORDER — BENZONATATE 100 MG PO CAPS: 200.0000 mg | ORAL_CAPSULE | Freq: Three times a day (TID) | ORAL | 0 refills | Status: AC | PRN

## 2024-09-16 MED ORDER — AZITHROMYCIN 250 MG PO TABS: ORAL_TABLET | ORAL | 0 refills | Status: AC

## 2024-09-16 NOTE — Progress Notes (Signed)
 We are sorry that you are not feeling well.  Here is how we plan to help!  Based on your presentation I believe you most likely have A cough due to bacteria.  When patients have a fever and a productive cough with a change in color or increased sputum production, we are concerned about bacterial bronchitis.  If left untreated it can progress to pneumonia.  If your symptoms do not improve with your treatment plan it is important that you contact your provider.   I have prescribed Azithromyin 250 mg: two tablets now and then one tablet daily for 4 additonal days    In addition you may use A prescription cough medication called Tessalon  Perles 100mg . You may take 1-2 capsules every 8 hours as needed for your cough.  From your responses in the eVisit questionnaire you describe inflammation in the upper respiratory tract which is causing a significant cough.  This is commonly called Bronchitis and has four common causes:   Allergies Viral Infections Acid Reflux Bacterial Infection Allergies, viruses and acid reflux are treated by controlling symptoms or eliminating the cause. An example might be a cough caused by taking certain blood pressure medications. You stop the cough by changing the medication. Another example might be a cough caused by acid reflux. Controlling the reflux helps control the cough.  USE OF BRONCHODILATOR (RESCUE) INHALERS: There is a risk from using your bronchodilator too frequently.  The risk is that over-reliance on a medication which only relaxes the muscles surrounding the breathing tubes can reduce the effectiveness of medications prescribed to reduce swelling and congestion of the tubes themselves.  Although you feel brief relief from the bronchodilator inhaler, your asthma may actually be worsening with the tubes becoming more swollen and filled with mucus.  This can delay other crucial treatments, such as oral steroid medications. If you need to use a bronchodilator inhaler  daily, several times per day, you should discuss this with your provider.  There are probably better treatments that could be used to keep your asthma under control.     HOME CARE Only take medications as instructed by your medical team. Complete the entire course of an antibiotic. Drink plenty of fluids and get plenty of rest. Avoid close contacts especially the very young and the elderly Cover your mouth if you cough or cough into your sleeve. Always remember to wash your hands A steam or ultrasonic humidifier can help congestion.   GET HELP RIGHT AWAY IF: You develop worsening fever. You become short of breath You cough up blood. Your symptoms persist after you have completed your treatment plan MAKE SURE YOU  Understand these instructions. Will watch your condition. Will get help right away if you are not doing well or get worse.  Your e-visit answers were reviewed by a board certified advanced clinical practitioner to complete your personal care plan.  Depending on the condition, your plan could have included both over the counter or prescription medications. If there is a problem please reply  once you have received a response from your provider. Your safety is important to us .  If you have drug allergies check your prescription carefully.    You can use MyChart to ask questions about today's visit, request a non-urgent call back, or ask for a work or school excuse for 24 hours related to this e-Visit. If it has been greater than 24 hours you will need to follow up with your provider, or enter a new e-Visit to  address those concerns. You will get an e-mail in the next two days asking about your experience.  I hope that your e-visit has been valuable and will speed your recovery. Thank you for using e-visits.   I have spent 5 minutes in review of e-visit questionnaire, review and updating patient chart, medical decision making and response to patient.   Mart Colpitts, FNP

## 2024-09-23 NOTE — Progress Notes (Unsigned)
 "  Odis Mace D.[facility]MENTE[facility] [address] [place] [address] Phone: [phone]   Assessment and Plan:     1. Chronic left SI joint pain (Primary) 2. Chronic bilateral low back pain with left-sided sciatica 3. Degeneration of intervertebral disc of lumbar region with discogenic back pain - Chronic with exacerbation, subsequent visit - Continued left lower back pain, left posterior hip pain.  No improvement with SI joint CSI performed on 08/27/2024. - Restart physical therapy.  Referral sent - Recommend further evaluation with neurosurgery for second opinion to review MRIs and see if alternative procedure/intervention could provide patient with relief - Radicular symptoms have improved with conservative therapy, meloxicam  course, epidural CSI.  Do not feel the patient needs repeat epidural CSI at this time - Use meloxicam  15 mg or other NSAID daily as needed for breakthrough pain.  Recommend limiting chronic NSAIDs to 1-2 doses per week to prevent long-term side effects. Use Tylenol  500 to 1000 mg tablets 2-3 times a day as needed for day-to-day pain relief.    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  Would review benefit of physical therapy.  Would review neurosurgery recommendations.  Could consider restarting OMT versus additional CSI versus  repeat epidural   Subjective:   I, Jessie Cowher, am serving as a neurosurgeon for Doctor Morene Mace  Chief Complaint: neck and shoulder pain    HPI:    09/26/2022 Patient is a 49 year old female complaining of neck and shoulder pain. Patient states has been going on for months , she has flares out of no where, neck and shoulders tighten and dont let go, thinks it might be stress related, sometimes she does have numbness and tingling down to her fingers, no meds for the pain , upper trap pain, at rest she has a little pain , decreased ROM due to pain notes she felt a crack yesterday that was painful and then it felt good    10/17/2022 Patient states that she is okay    11/07/2022 Patient states she is okay    12/05/2022 Patient states she is good ,better    12/28/2022 Patient states moderate management of neck and upper back sx, continues to have periscapular pain. Notes flare-up of lower back pain, bilateral. Pain at posterior aspect of the hips/SI joint. Denies sharp shooting pain, n/t, weakness in B LE. Sx x 4 days. Has tried HEP and TheraGun. Scheduled for surgery on Monday.    10/16/2023 Patient states low back , hip mostly left hip pain flare . She is not able to get out of bed . She was not able to get out of car today. Was seen by chiro and that helped some . Is interested in OMT   11/27/2023 Patient states took meloxicam  today because the hip has been hurting. Pain is noticeable but not terrible. Pain has been consistent even with massage therapy and dry needling.    12/11/2023 Patient  states she has completed the prednisone . It was helping until the wasn't anymore medication to take. Does not feel that well today. First thing in the morning it is bad to the point where she can not get up (left near butt). Been stretching and trying to strengthening glutes via body weight exercises. However, no change has been witnessed.    01/08/2024 Only minimal relief after SI joint CSI at  previous office visit.  Patient still experiencing significant low back pain, pain into left posterior hip on a daily basis that will flare and limit day-to-day activities.   01/22/2024 Patient states she is the same. Hasn't been able to do her ADLs    06/11/2024 Patient states pain came back  SI low back ,in august but the pain never really subsided. Meloxicam  has helped    07/01/2024 Patient states she had a rough few days after the epidural. Her leg was heavy. But today is a good day.    07/29/2024 Patient states she is the same    08/27/2024 Patient states that today is not so bad. Yesterday was a bad day. While taking the meloxicam  she was doing everything and was doing well. Tired not taking the meloxicam  to see how things are without it. Walked 4 miles the day before yesterday. Took a two day break after the two weeks of the dosage then started back again. Has about 2 to 3 tables left of the dosage.   09/24/2024 Patient states she is the same    Relevant Historical Information: None pertinent  Additional pertinent review of systems negative.  Current Outpatient Medications  Medication Sig Dispense Refill   benzonatate  (TESSALON ) 100 MG capsule Take 2 capsules (200 mg total) by mouth 3 (three) times daily as needed for up to 10 days. 30 capsule 0   EPINEPHrine  0.3 mg/0.3 mL IJ SOAJ injection Inject 0.3 mg into the muscle as needed for anaphylaxis. 1 each 0   escitalopram (LEXAPRO) 10 MG tablet Take 10 mg by mouth daily.     fluticasone  (FLONASE ) 50 MCG/ACT nasal spray Place 2 sprays into both nostrils daily. 16 g 0   meloxicam  (MOBIC ) 15 MG tablet Take 1 tablet (15 mg total) by mouth as needed for pain. 30 tablet 0   methylPREDNISolone  (MEDROL  DOSEPAK) 4 MG TBPK tablet Take 6 tablets on day 1.  Take 5 tablets on day 2.  Take 4 tablets on day 3.  Take 3 tablets on day 4.  Take 2 tablets on day 5.  Take 1 tablet on day 6. 21 tablet 0   minoxidil (LONITEN) 2.5 MG tablet Take 2.5 mg by  mouth daily. PATIENT TAKES 1/2 TABLET DAILY     naproxen  (NAPROSYN ) 500 MG tablet Take 1 tablet (500 mg total) by mouth 2 (two) times daily with a meal. 30 tablet 0   oseltamivir  (TAMIFLU ) 75 MG capsule Take 1 capsule (75 mg total) by mouth 2 (two) times daily. 10 capsule 0   tirzepatide  (MOUNJARO ) 5 MG/0.5ML Pen Inject 5 mg into the skin once a week.     No current facility-administered medications for this visit.      Objective:     Vitals:   09/24/24 0938  Pulse: 64  SpO2: 98%  Weight: 161 lb (73 kg)  Height: 5' 2 (1.575 m)      Body mass index is 29.45 kg/m.    Physical Exam:     General: awake, alert, and oriented no acute distress, nontoxic Skin: no suspicious  lesions or rashes Neuro:sensation intact distally with no deficits, normal muscle tone, no atrophy, strength 5/5 in all tested lower ext groups Psych: normal mood and affect, speech clear   Left hip: No deformity, swelling or wasting ROM Flexion 90, ext 30, IR 45, ER 45 TTP  left SI joint with positive sphinx NTTP over the hip flexors,  lumbar spine,gluteal musculature, greater trochanter, IT band, Negative log roll with FROM Negative FABER Negative FADIR Negative Piriformis test,  Negative trendelenberg Gait normal    Electronically signed by:  Odis Mace D.[facility] 9:49 AM 09/24/2024 "

## 2024-09-24 ENCOUNTER — Ambulatory Visit: Admitting: Sports Medicine

## 2024-09-24 VITALS — HR 64 | Ht 62.0 in | Wt 161.0 lb

## 2024-09-24 DIAGNOSIS — G8929 Other chronic pain: Secondary | ICD-10-CM | POA: Diagnosis not present

## 2024-09-24 DIAGNOSIS — M5136 Other intervertebral disc degeneration, lumbar region with discogenic back pain only: Secondary | ICD-10-CM

## 2024-09-24 NOTE — Patient Instructions (Signed)
 PT referral   Neurosurgery referral   6 week follow up

## 2024-09-26 ENCOUNTER — Other Ambulatory Visit: Payer: Self-pay

## 2024-09-26 ENCOUNTER — Encounter: Payer: Self-pay | Admitting: Rehabilitative and Restorative Service Providers"

## 2024-09-26 ENCOUNTER — Ambulatory Visit: Attending: Sports Medicine | Admitting: Rehabilitative and Restorative Service Providers"

## 2024-09-26 DIAGNOSIS — M5442 Lumbago with sciatica, left side: Secondary | ICD-10-CM | POA: Insufficient documentation

## 2024-09-26 DIAGNOSIS — G8929 Other chronic pain: Secondary | ICD-10-CM | POA: Insufficient documentation

## 2024-09-26 DIAGNOSIS — M5459 Other low back pain: Secondary | ICD-10-CM

## 2024-09-26 DIAGNOSIS — M5136 Other intervertebral disc degeneration, lumbar region with discogenic back pain only: Secondary | ICD-10-CM | POA: Diagnosis not present

## 2024-09-26 DIAGNOSIS — M6281 Muscle weakness (generalized): Secondary | ICD-10-CM | POA: Diagnosis not present

## 2024-09-26 DIAGNOSIS — M533 Sacrococcygeal disorders, not elsewhere classified: Secondary | ICD-10-CM | POA: Insufficient documentation

## 2024-09-26 DIAGNOSIS — R278 Other lack of coordination: Secondary | ICD-10-CM | POA: Diagnosis not present

## 2024-09-26 DIAGNOSIS — R252 Cramp and spasm: Secondary | ICD-10-CM
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
## 2024-09-26 NOTE — Patient Instructions (Signed)
 [facility]  Welcome to [facility]! Here you will find all the information you will need regarding your pool therapy. If you have further questions at any time, please call our office at [phone]. After completing your initial evaluation in the [facility], you may be eligible to complete a portion of your therapy in the pool. A typical week of therapy will consist of 1-2 typical physical therapy visits at our [facility] location and an additional session of therapy in the pool located at the [facility] at [facility]. [address], [place] [address]. The phone number at the pool site is [phone]. Please call this number if you are running late or need to cancel your appointment.  Check-in on MyChart then meet your therapist at the pool deck. (If you can't access MyChart, you may check in with the therapist at the pool deck.)   Each session will last approximately 45 minutes. All scheduling and payments for aquatic therapy sessions, including cancelations, will be done through our [facility] location.  To be eligible for aquatic therapy, these criteria must be met: You must be able to independently change in the locker room and get to the pool deck. A caregiver can come with you to help if needed however they do need to be the same sex to enter the locker room. Or you may change in a bathroom privately with opposite sex caregiver if needed. There are benches for a caregiver to sit on next to the pool.  Handicap parking is available in the front and there is a drop off option for even closer accessibility.  Please arrive 15 minutes prior to your appointment to prepare for your pool session. You must sign in at the front desk upon your arrival. Please be sure to attend to any toileting needs prior to entering the pool. Locker rooms for changing are available.  There is direct access to the pool deck from the locker  room. You can lock your belongings in a locker or bring them with you poolside. Your therapist will greet you on the pool deck. There may be other swimmers in the pool at the same time but your session is one-on-one with the therapist.    What to Expect Arrive 15 min early for your appointment and check in with rehab front desk. Please limit use of body lotions and hair products before entering the pool. Locker rooms are available for showering, changing and toileting. Appointments are 45 minutes with your therapist. (This does not include changing times) The pool is approximately 500 feet from the nearest parking lot. There are benches and chairs along the walk. Please bring a support person if you need assistance traveling the distanceto the pool or assistance with changing/toileting. Stairs with handrails as well as a lift chair are available at the pool.  Depth is 3'6"-4'8" and temperature is between 88-90 degrees. The pool deck is tile flooring and gets slippery, water  shoes are strongly encouraged but not required. Please wear a bathing suit or athletic shorts and a t-shirt. Recommended to bring your own towel. Severe weather:Thunder or lightning results in closure of the pool deck for 30 minutes and is extended with each incidence. Your appointment may be moved to land or canceled with the option to reschedule. Tell your therapist if you have any of the following: Open wounds Active infection Fear of water  Bowel or bladder incontinence  Benefits of Aquatic Therapy:  Reduces Stress on Joints  and Muscles  The buoyancy of water  supports body weight, making movement easier and less painful.  Builds Strength and Stability  The viscosity of water  provides resistance that allows individuals to strengthen muscles while also providing a safe environment to improve balance and coordination.  Promotes Relaxation  The warm water  and the feeling of being supported can help reduce stress and be  beneficial for overall well-being.

## 2024-09-29 ENCOUNTER — Ambulatory Visit: Admitting: Physical Therapy

## 2024-10-07 ENCOUNTER — Ambulatory Visit: Admitting: Rehabilitative and Restorative Service Providers"

## 2024-10-07 ENCOUNTER — Encounter: Payer: Self-pay | Admitting: Rehabilitative and Restorative Service Providers"

## 2024-10-07 DIAGNOSIS — M5459 Other low back pain: Secondary | ICD-10-CM

## 2024-10-07 DIAGNOSIS — M6281 Muscle weakness (generalized): Secondary | ICD-10-CM

## 2024-10-07 DIAGNOSIS — R278 Other lack of coordination: Secondary | ICD-10-CM | POA: Diagnosis not present

## 2024-10-07 DIAGNOSIS — R252 Cramp and spasm: Secondary | ICD-10-CM

## 2024-10-07 DIAGNOSIS — M542 Cervicalgia: Secondary | ICD-10-CM
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
## 2024-10-09 ENCOUNTER — Ambulatory Visit: Admitting: Physical Therapy

## 2024-10-09 ENCOUNTER — Encounter: Payer: Self-pay | Admitting: Physical Therapy

## 2024-10-09 DIAGNOSIS — M5459 Other low back pain: Secondary | ICD-10-CM

## 2024-10-09 DIAGNOSIS — R278 Other lack of coordination: Secondary | ICD-10-CM

## 2024-10-09 DIAGNOSIS — M542 Cervicalgia: Secondary | ICD-10-CM

## 2024-10-09 DIAGNOSIS — R252 Cramp and spasm: Secondary | ICD-10-CM

## 2024-10-09 DIAGNOSIS — M6281 Muscle weakness (generalized): Secondary | ICD-10-CM
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
## 2024-10-10 NOTE — Progress Notes (Signed)
 "   Referring Physician:  Kennyth Worth HERO, MD [address] [place],  [place] [address]  Primary Physician:  Kennyth Worth HERO, MD  History of Present Illness: 10/16/2024 Ms. Kelsey [name] is here today with a chief complaint of persistent left-sided low back and leg pain.  For 1.5 to 2 years she has had constant sharp, stabbing pain in the left buttock radiating down the left leg. Pain worsens with physical activity such as cleaning, lifting, and lower body exercise. She can no longer perform leg weightlifting and has largely stopped gym workouts except seated arm exercises. Walking about one mile is possible, but pain intensifies on hills. With increased activity and severe pain she develops intermittent left leg numbness and paresthesias. Symptoms and severity have been stable since an MRI in April of last year.  Prior hip imaging was normal. She has had two lumbar epidural steroid injections. The first gave substantial relief for about two weeks until pain recurred after lifting a suitcase. The second caused transient left leg weakness requiring manual assistance for several days and did not provide durable pain relief. Physical therapy with targeted pressure techniques produced only brief hours-long relief, with symptoms returning with activity.  She has tried several medications. A four-week course of meloxicam  gave marked improvement and allowed her to resume running and walking, but pain recurred when she stopped it. She prefers to limit medication use but intermittently restarts meloxicam  for severe pain with functional benefit.   She had a pulmonary embolism after her surgical procedure 2 years ago.  She is no longer on anticoagulation.  Discussed the use of AI scribe software for clinical note transcription with the patient, who gave verbal consent to proceed.  Bowel/Bladder Dysfunction: none  Conservative measures:  Physical therapy:  currently participating in a round of PT through  [facility] initial evaluation on 09/26/24, however she has completed over 6 weeks of PT from 10/24/23 to 01/21/24 through [facility] Multimodal medical therapy including regular antiinflammatories:  meloxicam , tylenol , gabapentin , prednisone , naproxen   Injections:   06/25/24: Left L5-S1 at [facility] 02/25/24: Left L4-L5 at [facility]   Past Surgery: no spinal surgeries   Kelsey [name] has no symptoms of cervical myelopathy.  The symptoms are causing a significant impact on the patient's life.   I have utilized the care everywhere function in epic to review the outside records available from external health systems.   Review of Systems:  A 10 point review of systems is negative, except for the pertinent positives and negatives detailed in the HPI.  Past Medical History: Past Medical History:  Diagnosis Date   Hypothyroidism    Pulmonary embolus (HCC) 11/17/2022    Past Surgical History: Past Surgical History:  Procedure Laterality Date   APPENDECTOMY     BREAST REDUCTION SURGERY     DILATION AND CURETTAGE OF UTERUS     LAPAROSCOPIC OVARIAN CYSTECTOMY     LIPOSUCTION     Tummy tuck N/A 2024    Allergies: Allergies as of 10/16/2024 - Review Complete 10/09/2024  Allergen Reaction Noted   Ferrlecit [na ferric gluc cplx in sucrose] Hives, Swelling, and Other (See Comments) 01/30/2023    Medications: Current Medications[1]  Social History: Social History[2]  Family Medical History: Family History  Problem Relation Age of Onset   Diabetes Mother    Hyperlipidemia Mother    Hypertension Mother    Heart disease Father    Hyperlipidemia Father    Hypertension Father    Diabetes Sister  Stomach cancer Paternal Grandfather    Colon cancer Neg Hx    Colon polyps Neg Hx    Esophageal cancer Neg Hx    Pancreatic cancer Neg Hx     Physical Examination: Vitals:   10/16/24 1119  BP: 110/80    General: Patient is in no apparent distress. Attention to examination  is appropriate.  Neck:   Supple.  Full range of motion.  Respiratory: Patient is breathing without any difficulty.   NEUROLOGICAL:     Awake, alert, oriented to person, place, and time.  Speech is clear and fluent.   Cranial Nerves: Pupils equal round and reactive to light.  Facial tone is symmetric.  Facial sensation is symmetric. Shoulder shrug is symmetric. Tongue protrusion is midline.  There is no pronator drift.  Strength: Side Biceps Triceps Deltoid Interossei Grip Wrist Ext. Wrist Flex.  R 5 5 5 5 5 5 5   L 5 5 5 5 5 5 5    Side Iliopsoas Quads Hamstring PF DF EHL  R 5 5 5 5 5 5   L 5 5 5 5 5 5    Reflexes are 1+ and symmetric at the biceps, triceps, brachioradialis, patella and achilles.   Hoffman's is absent.   Bilateral upper and lower extremity sensation is intact to light touch.    No evidence of dysmetria noted.  Gait is normal.    No TTP B SI joints   Medical Decision Making  Imaging: MRI L spine 01/13/2024 IMPRESSION: 1. L5-S1: Disc degeneration with a shallow disc protrusion. No compressive effect upon the thecal sac or S1 nerve roots. Proximal foraminal encroachment on the left could possibly irritate the left L5 nerve. Discogenic endplate marrow changes at this level could relate to regional pain. 2. L4-5: Disc bulge. Mild facet osteoarthritis. No compressive stenosis. 3. L3-4: Disc bulge. Mild facet osteoarthritis. No compressive stenosis. There is some edematous change associated with the facet joint on the right and this could be a cause of local pain.     Electronically Signed   By: Oneil Officer M.D.   On: 01/16/2024 16:34  I have personally reviewed the images and agree with the above interpretation.  Assessment and Plan: Ms. Kelsey [name] is a pleasant 49 y.o. female with left buttock pain with some lower back pain as well.  She has tried physical therapy without improvement.  Her symptoms are markedly reduced with meloxicam .  I reviewed her  MRI scan with her.  I do not think that there is significant compression of any of her lumbar nerve roots.  The disc herniation noted on her MRI report is not significant clinically.  I would not recommend surgical intervention.  She has no renal dysfunction on her most recent labs.  I think she could consider continuing NSAIDs on a long-term basis given that they have caused such a substantial improvement in her symptoms.  I recommended that she discuss this with her primary care provider and with Dr. Leonce.  We discussed massage therapy, acupuncture, and chiropractic care.  Although there is not as much evidence to support these modalities, they may help her if she continues to have pain.  She could also consider repeating her hip MRI and her lumbar spine MRI should her symptoms change in the future.  I will see her back on an as-needed basis.  I spent a total of 30 minutes in this patient's care today. This time was spent reviewing pertinent records including imaging studies, obtaining and confirming history, performing  a directed evaluation, formulating and discussing my recommendations, and documenting the visit within the medical record.      Thank you for involving me in the care of this patient.      Jojo Pehl K. Clois MD, [facility] Neurosurgery     [1]  Current Outpatient Medications:    EPINEPHrine  0.3 mg/0.3 mL IJ SOAJ injection, Inject 0.3 mg into the muscle as needed for anaphylaxis., Disp: 1 each, Rfl: 0   escitalopram (LEXAPRO) 10 MG tablet, Take 10 mg by mouth daily., Disp: , Rfl:    fluticasone  (FLONASE ) 50 MCG/ACT nasal spray, Place 2 sprays into both nostrils daily., Disp: 16 g, Rfl: 0   ibuprofen  (ADVIL ) 200 MG tablet, Take 200 mg by mouth every 6 (six) hours as needed., Disp: , Rfl:    meloxicam  (MOBIC ) 15 MG tablet, Take 1 tablet (15 mg total) by mouth as needed for pain., Disp: 30 tablet, Rfl: 0   minoxidil (LONITEN) 2.5 MG tablet, Take 2.5 mg by mouth daily.  PATIENT TAKES 1/2 TABLET DAILY, Disp: , Rfl:    tirzepatide  (MOUNJARO ) 5 MG/0.5ML Pen, Inject 5 mg into the skin once a week., Disp: , Rfl:  [2]  Social History Tobacco Use   Smoking status: Never   Smokeless tobacco: Never  Vaping Use   Vaping status: Never Used  Substance Use Topics   Alcohol use: Yes    Comment: Occ   Drug use: No   "

## 2024-10-13 ENCOUNTER — Encounter: Payer: BC Managed Care – PPO | Admitting: Family Medicine

## 2024-10-14 ENCOUNTER — Ambulatory Visit: Admitting: Rehabilitative and Restorative Service Providers"

## 2024-10-16 ENCOUNTER — Ambulatory Visit: Admitting: Neurosurgery

## 2024-10-16 ENCOUNTER — Encounter: Payer: Self-pay | Admitting: Neurosurgery

## 2024-10-16 VITALS — BP 110/80 | Wt 161.2 lb

## 2024-10-16 DIAGNOSIS — M79605 Pain in left leg: Secondary | ICD-10-CM

## 2024-10-16 DIAGNOSIS — M533 Sacrococcygeal disorders, not elsewhere classified: Secondary | ICD-10-CM | POA: Diagnosis not present

## 2024-10-16 DIAGNOSIS — M545 Low back pain, unspecified: Secondary | ICD-10-CM

## 2024-10-17 ENCOUNTER — Encounter: Payer: Self-pay | Admitting: Physical Therapy

## 2024-10-17 ENCOUNTER — Ambulatory Visit: Admitting: Physical Therapy

## 2024-10-17 DIAGNOSIS — R278 Other lack of coordination: Secondary | ICD-10-CM | POA: Diagnosis not present

## 2024-10-17 DIAGNOSIS — M6281 Muscle weakness (generalized): Secondary | ICD-10-CM

## 2024-10-17 DIAGNOSIS — M5459 Other low back pain: Secondary | ICD-10-CM

## 2024-10-17 DIAGNOSIS — R252 Cramp and spasm: Secondary | ICD-10-CM
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
## 2024-10-22 ENCOUNTER — Ambulatory Visit: Admitting: Physical Therapy

## 2024-10-22 ENCOUNTER — Encounter: Payer: Self-pay | Admitting: Physical Therapy

## 2024-10-22 DIAGNOSIS — R252 Cramp and spasm: Secondary | ICD-10-CM

## 2024-10-22 DIAGNOSIS — M6281 Muscle weakness (generalized): Secondary | ICD-10-CM

## 2024-10-22 DIAGNOSIS — R278 Other lack of coordination: Secondary | ICD-10-CM

## 2024-10-22 DIAGNOSIS — M5459 Other low back pain: Secondary | ICD-10-CM
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
## 2024-10-23 ENCOUNTER — Ambulatory Visit: Admitting: Family Medicine

## 2024-10-23 ENCOUNTER — Encounter: Payer: Self-pay | Admitting: Family Medicine

## 2024-10-23 VITALS — BP 112/70 | HR 71 | Temp 97.3°F | Ht 62.0 in | Wt 165.2 lb

## 2024-10-23 DIAGNOSIS — R197 Diarrhea, unspecified: Secondary | ICD-10-CM

## 2024-10-23 DIAGNOSIS — Z13220 Encounter for screening for lipoid disorders: Secondary | ICD-10-CM

## 2024-10-23 DIAGNOSIS — F419 Anxiety disorder, unspecified: Secondary | ICD-10-CM

## 2024-10-23 DIAGNOSIS — E66811 Obesity, class 1: Secondary | ICD-10-CM

## 2024-10-23 DIAGNOSIS — Z131 Encounter for screening for diabetes mellitus: Secondary | ICD-10-CM

## 2024-10-23 DIAGNOSIS — R202 Paresthesia of skin: Secondary | ICD-10-CM

## 2024-10-23 DIAGNOSIS — Z0001 Encounter for general adult medical examination with abnormal findings: Secondary | ICD-10-CM

## 2024-10-23 DIAGNOSIS — Z1159 Encounter for screening for other viral diseases: Secondary | ICD-10-CM

## 2024-10-23 DIAGNOSIS — N951 Menopausal and female climacteric states: Secondary | ICD-10-CM | POA: Insufficient documentation

## 2024-10-23 DIAGNOSIS — G8929 Other chronic pain: Secondary | ICD-10-CM | POA: Insufficient documentation

## 2024-10-23 LAB — VITAMIN B12: Vitamin B-12: 668 pg/mL (ref 211–911)

## 2024-10-23 LAB — COMPREHENSIVE METABOLIC PANEL WITH GFR
ALT: 10 U/L (ref 3–35)
AST: 16 U/L (ref 5–37)
Albumin: 4.5 g/dL (ref 3.5–5.2)
Alkaline Phosphatase: 48 U/L (ref 39–117)
BUN: 11 mg/dL (ref 6–23)
CO2: 30 meq/L (ref 19–32)
Calcium: 9.2 mg/dL (ref 8.4–10.5)
Chloride: 105 meq/L (ref 96–112)
Creatinine, Ser: 0.71 mg/dL (ref 0.40–1.20)
GFR: 101.39 mL/min
Glucose, Bld: 88 mg/dL (ref 70–99)
Potassium: 4.9 meq/L (ref 3.5–5.1)
Sodium: 138 meq/L (ref 135–145)
Total Bilirubin: 1 mg/dL (ref 0.2–1.2)
Total Protein: 7.3 g/dL (ref 6.0–8.3)

## 2024-10-23 LAB — CBC
HCT: 39 % (ref 36.0–46.0)
Hemoglobin: 13.2 g/dL (ref 12.0–15.0)
MCHC: 33.8 g/dL (ref 30.0–36.0)
MCV: 91.9 fl (ref 78.0–100.0)
Platelets: 290 10*3/uL (ref 150.0–400.0)
RBC: 4.24 Mil/uL (ref 3.87–5.11)
RDW: 14.5 % (ref 11.5–15.5)
WBC: 4.3 10*3/uL (ref 4.0–10.5)

## 2024-10-23 LAB — VITAMIN D 25 HYDROXY (VIT D DEFICIENCY, FRACTURES): VITD: 22.17 ng/mL — ABNORMAL LOW (ref 30.00–100.00)

## 2024-10-23 LAB — LIPID PANEL
Cholesterol: 182 mg/dL (ref 28–200)
HDL: 66.7 mg/dL
LDL Cholesterol: 100 mg/dL — ABNORMAL HIGH (ref 10–99)
NonHDL: 115.64
Total CHOL/HDL Ratio: 3
Triglycerides: 77 mg/dL (ref 10.0–149.0)
VLDL: 15.4 mg/dL (ref 0.0–40.0)

## 2024-10-23 LAB — TSH: TSH: 0.94 u[IU]/mL (ref 0.35–5.50)

## 2024-10-23 LAB — HEMOGLOBIN A1C: Hgb A1c MFr Bld: 5.5 % (ref 4.6–6.5)

## 2024-10-23 NOTE — Assessment & Plan Note (Signed)
 She is working with PT, sports medicine, and orthopedics.  Uses meloxicam  as needed.  Recently started aquatic therapy which does seem to be beneficial though only recently started this.  She will continue current treatment plan.

## 2024-10-23 NOTE — Progress Notes (Signed)
 "  Chief Complaint:  Kelsey [name] is a 49 y.o. female who presents today for her annual comprehensive physical exam.    Assessment/Plan:  Chronic Problems Addressed Today: Chronic low back pain She is working with PT, sports medicine, and orthopedics.  Uses meloxicam  as needed.  Recently started aquatic therapy which does seem to be beneficial though only recently started this.  She will continue current treatment plan.  Paresthesia Patient with paresthesias and allodynia intermittently in her left hand while sleeping.  Potentially may have some component of carpal tunnel syndrome though overall exam today was reassuring.  She does have also have history of cervical degenerative disc disease which may be contributing as well.  She can try nocturnal splinting to see if this will help with any potential carpal tunnel component.  Advised her to follow back up with sports medicine if not improving with this.  Anxiety Stable on Lexapro 10 mg daily per OB/GYN.  Obesity On Mounjaro  per physician in [place] .  She is working on lifestyle interventions.  Perimenopausal symptoms Patient with irregular periods for the last couple years.  Discussed with patient that she is likely in the perimenopausal phase though this is somewhat difficult to determine due to her having a Mirena in place.  Advised her to follow-up with OB/GYN to discuss management options.   Preventative Healthcare: Check labs.  Follows with gynecology for [facility].  Up-to-date on colonoscopy.  Patient Counseling(The following topics were reviewed and/or handout was given):  -Nutrition: Stressed importance of moderation in sodium/caffeine intake, saturated fat and cholesterol, caloric balance, sufficient intake of fresh fruits, vegetables, and fiber.  -Stressed the importance of regular exercise.   -Substance Abuse: Discussed cessation/primary prevention of tobacco, alcohol, or other drug use; driving or other dangerous  activities under the influence; availability of treatment for abuse.   -Injury prevention: Discussed safety belts, safety helmets, smoke detector, smoking near bedding or upholstery.   -Sexuality: Discussed sexually transmitted diseases, partner selection, use of condoms, avoidance of unintended pregnancy and contraceptive alternatives.   -Dental health: Discussed importance of regular tooth brushing, flossing, and dental visits.  -Health maintenance and immunizations reviewed. Please refer to Health maintenance section.  Return to care in 1 year for next preventative visit.     Subjective:  HPI:  She has no acute complaints today. Patient is here today for her annual physical.  See assessment / plan for status of chronic conditions.  Discussed the use of AI scribe software for clinical note transcription with the patient, who gave verbal consent to proceed.  History of Present Illness Kelsey [name] is a 49 year old female who presents for follow-up on her chronic pain management and perimenopausal symptoms.  She has experienced chronic back pain for several years and has been told she has two bulging discs. Recently, she started physical therapy, including aquatic therapy, which she finds beneficial as it allows her to exercise without pain. However, she still experiences sharp pain after activity, requiring Advil  for relief. She has previously been prescribed meloxicam , which provided significant relief, but she uses it sparingly due to concerns about long-term use. The pain is described as shooting down her leg and sometimes localized to her buttocks, with variability in location and intensity. She has had SI joint injections and epidurals in the past, which provided temporary relief. However, a recent epidural resulted in temporary leg immobility, making her hesitant to repeat the procedure.  She reports irregular menstrual cycles, which she attributes to her  IUD. In January, she had a  prolonged and heavy period lasting 8-10 days with unusual coloration. She is concerned about the possibility of entering menopause or perimenopause. Her family history includes dementia, as both her parents have been affected. She has difficulty remembering names, which her sister attributes to perimenopause.  She is currently on a low dose of semaglutide, which she finds ineffective for weight management, as her weight fluctuates between 160 and 170 pounds. She attributes her weight issues to a lack of exercise due to back pain.  She reports new symptoms of burning and sensitivity in her hands, particularly at night, described as feeling 'hot from the inside out.' This sensation has been occurring since October and is sometimes accompanied by pain when touched or when her hand is brushed against something. No numbness or tingling in hands except when sleeping on them wrong.       10/23/2024   10:12 AM  Depression screen PHQ 2/9  Decreased Interest 0  Down, Depressed, Hopeless 0  PHQ - 2 Score 0    Health Maintenance Due  Topic Date Due   Mammogram  Never done     ROS: Per HPI, otherwise a complete review of systems was negative.   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   Anemia 01/10/2023   Pulmonary embolism (HCC) 01/09/2023   Macromastia 12/28/2022   Paresthesia 09/07/2022   Obesity 02/10/2022   Anxiety 07/28/2021   Neck pain 07/28/2021   Nonintractable headache 07/28/2021   Insomnia 07/28/2021   Past Surgical History:  Procedure Laterality Date   APPENDECTOMY     BREAST REDUCTION SURGERY     DILATION AND CURETTAGE OF  UTERUS     LAPAROSCOPIC OVARIAN CYSTECTOMY     LIPOSUCTION     Tummy tuck N/A 2024    Family History  Problem Relation Age of Onset   Diabetes Mother    Hyperlipidemia Mother    Hypertension Mother    Heart disease Father    Hyperlipidemia Father    Hypertension Father    Diabetes Sister    Stomach cancer Paternal Grandfather    Colon cancer Neg Hx    Colon polyps Neg Hx    Esophageal cancer Neg Hx    Pancreatic cancer Neg Hx     Medications- reviewed and updated Current Outpatient Medications  Medication Sig Dispense Refill   EPINEPHrine  0.3 mg/0.3 mL IJ SOAJ injection Inject 0.3 mg into the muscle as needed for anaphylaxis. 1 each 0   escitalopram (LEXAPRO) 10 MG tablet Take 10 mg by mouth daily.     fluticasone  (FLONASE ) 50 MCG/ACT nasal spray Place 2 sprays into both nostrils daily. 16 g 0   ibuprofen  (ADVIL ) 200 MG tablet Take 200 mg by mouth every 6 (six) hours as needed.     meloxicam  (MOBIC ) 15 MG tablet Take 1 tablet (15 mg total) by mouth as needed for pain. 30 tablet 0   minoxidil (LONITEN) 2.5 MG tablet Take 2.5 mg by mouth daily. PATIENT TAKES 1/2 TABLET DAILY     tirzepatide  (MOUNJARO ) 5 MG/0.5ML Pen Inject 5 mg into the skin once a week.  No current facility-administered medications for this visit.    Allergies-reviewed and updated Allergies[1]  Social History   Socioeconomic History   Marital status: Married    Spouse name: Not on file   Number of children: Not on file   Years of education: Not on file   Highest education level: Not on file  Occupational History   Not on file  Tobacco Use   Smoking status: Never   Smokeless tobacco: Never  Vaping Use   Vaping status: Never Used  Substance and Sexual Activity   Alcohol use: Yes    Comment: Occ   Drug use: No   Sexual activity: Not on file  Other Topics Concern   Not on file  Social History Narrative   Not on file   Social Drivers of Health   Tobacco Use: Low Risk (10/23/2024)    Patient History    Smoking Tobacco Use: Never    Smokeless Tobacco Use: Never    Passive Exposure: Not on file  Financial Resource Strain: Not on file  Food Insecurity: Not on file  Transportation Needs: Not on file  Physical Activity: Not on file  Stress: Not on file  Social Connections: Not on file  Depression (PHQ2-9): Low Risk (10/23/2024)   Depression (PHQ2-9)    PHQ-2 Score: 0  Alcohol Screen: Not on file  Housing: Not on file  Utilities: Not on file  Health Literacy: Not on file        Objective:  Physical Exam: BP 112/70   Pulse 71   Temp (!) 97.3 F (36.3 C) (Temporal)   Ht 5' 2 (1.575 m)   Wt 165 lb 3.2 oz (74.9 kg)   LMP 09/26/2024   SpO2 97%   BMI 30.22 kg/m   Body mass index is 30.22 kg/m. Wt Readings from Last 3 Encounters:  10/23/24 165 lb 3.2 oz (74.9 kg)  10/16/24 161 lb 3.2 oz (73.1 kg)  09/24/24 161 lb (73 kg)   Gen: NAD, resting comfortably HEENT: TMs normal bilaterally. OP clear. No thyromegaly noted.  CV: RRR with no murmurs appreciated Pulm: NWOB, CTAB with no crackles, wheezes, or rhonchi GI: Normal bowel sounds present. Soft, Nontender, Nondistended. MSK: no edema, cyanosis, or clubbing noted - Hands: No deformities.  Full range of motion throughout.  Neurovascular intact distally.  Tinel sign negative bilaterally. Skin: warm, dry Neuro: CN2-12 grossly intact. Strength 5/5 in upper and lower extremities. Reflexes symmetric and intact bilaterally.  Psych: Normal affect and thought content     Ichelle Harral M. Kennyth, MD 10/23/2024 10:38 AM     [1]  Allergies Allergen Reactions   Ferrlecit [Na Ferric Gluc Cplx In Sucrose] Hives, Swelling and Other (See Comments)    Iron infusion   Back pain, difficulty swallowing.   "

## 2024-10-23 NOTE — Assessment & Plan Note (Signed)
 Patient with paresthesias and allodynia intermittently in her left hand while sleeping.  Potentially may have some component of carpal tunnel syndrome though overall exam today was reassuring.  She does have also have history of cervical degenerative disc disease which may be contributing as well.  She can try nocturnal splinting to see if this will help with any potential carpal tunnel component.  Advised her to follow back up with sports medicine if not improving with this.

## 2024-10-23 NOTE — Patient Instructions (Signed)
 It was very nice to see you today!  VISIT SUMMARY: During your visit, we discussed your chronic back pain, perimenopausal symptoms, weight management, and new hand symptoms. We reviewed your current treatments and made adjustments to help manage your conditions more effectively.  YOUR PLAN: LUMBAR RADICULOPATHY: You have chronic back pain with sharp pain radiating from your buttocks to your leg, likely due to bulging discs. -Continue with aquatic therapy and regular physical therapy. -Use meloxicam  as needed, but be mindful of long-term use risks. -Consider using a carpal tunnel splint at night.  CARPAL TUNNEL SYNDROME: You have burning and sensitivity in your hands, especially at night, which may be carpal tunnel syndrome. -Use a carpal tunnel splint during sleep to prevent wrist flexion.  CLASS 1 OBESITY: Your weight fluctuates between 160-170 pounds, and you find it difficult to lose weight due to limited activity from back pain. -Continue with aquatic therapy to exercise without pain. -Discuss weight management strategies with your primary care provider.  PERIMENOPAUSAL SYMPTOMS: You have irregular periods and memory issues, which may be related to perimenopause. -Consider using birth control pills to stabilize hormones. -Consult a gynecologist regarding hormone replacement therapy or birth control pills.  GENERAL HEALTH MAINTENANCE: Routine health check-up. -Routine blood work has been requested and ordered.  Return in about 1 year (around 10/23/2025) for Annual Physical.   Take care, Dr Kennyth  PLEASE NOTE:  If you had any lab tests, please let us  know if you have not heard back within a few days. You may see your results on mychart before we have a chance to review them but we will give you a call once they are reviewed by us .   If we ordered any referrals today, please let us  know if you have not heard from their office within the next week.   If you had any urgent  prescriptions sent in today, please check with the pharmacy within an hour of our visit to make sure the prescription was transmitted appropriately.   Please try these tips to maintain a healthy lifestyle:  Eat at least 3 REAL meals and 1-2 snacks per day.  Aim for no more than 5 hours between eating.  If you eat breakfast, please do so within one hour of getting up.   Each meal should contain half fruits/vegetables, one quarter protein, and one quarter carbs (no bigger than a computer mouse)  Cut down on sweet beverages. This includes juice, soda, and sweet tea.   Drink at least 1 glass of water with each meal and aim for at least 8 glasses per day  Exercise at least 150 minutes every week.    Preventive Care 25-19 Years Old, Female Preventive care refers to lifestyle choices and visits with your health care provider that can promote health and wellness. Preventive care visits are also called wellness exams. What can I expect for my preventive care visit? Counseling Your health care provider may ask you questions about your: Medical history, including: Past medical problems. Family medical history. Pregnancy history. Current health, including: Menstrual cycle. Method of birth control. Emotional well-being. Home life and relationship well-being. Sexual activity and sexual health. Lifestyle, including: Alcohol, nicotine or tobacco, and drug use. Access to firearms. Diet, exercise, and sleep habits. Work and work astronomer. Sunscreen use. Safety issues such as seatbelt and bike helmet use. Physical exam Your health care provider will check your: Height and weight. These may be used to calculate your BMI (body mass index). BMI is a measurement  that tells if you are at a healthy weight. Waist circumference. This measures the distance around your waistline. This measurement also tells if you are at a healthy weight and may help predict your risk of certain diseases, such as type 2  diabetes and high blood pressure. Heart rate and blood pressure. Body temperature. Skin for abnormal spots. What immunizations do I need?  Vaccines are usually given at various ages, according to a schedule. Your health care provider will recommend vaccines for you based on your age, medical history, and lifestyle or other factors, such as travel or where you work. What tests do I need? Screening Your health care provider may recommend screening tests for certain conditions. This may include: Lipid and cholesterol levels. Diabetes screening. This is done by checking your blood sugar (glucose) after you have not eaten for a while (fasting). Pelvic exam and Pap test. Hepatitis B test. Hepatitis C test. HIV (human immunodeficiency virus) test. STI (sexually transmitted infection) testing, if you are at risk. Lung cancer screening. Colorectal cancer screening. Mammogram. Talk with your health care provider about when you should start having regular mammograms. This may depend on whether you have a family history of breast cancer. BRCA-related cancer screening. This may be done if you have a family history of breast, ovarian, tubal, or peritoneal cancers. Bone density scan. This is done to screen for osteoporosis. Talk with your health care provider about your test results, treatment options, and if necessary, the need for more tests. Follow these instructions at home: Eating and drinking  Eat a diet that includes fresh fruits and vegetables, whole grains, lean protein, and low-fat dairy products. Take vitamin and mineral supplements as recommended by your health care provider. Do not drink alcohol if: Your health care provider tells you not to drink. You are pregnant, may be pregnant, or are planning to become pregnant. If you drink alcohol: Limit how much you have to 0-1 drink a day. Know how much alcohol is in your drink. In the [place] one drink equals one 12 oz bottle of beer (355 mL),  one 5 oz glass of wine (148 mL), or one 1 oz glass of hard liquor (44 mL). Lifestyle Brush your teeth every morning and night with fluoride toothpaste. Floss one time each day. Exercise for at least 30 minutes 5 or more days each week. Do not use any products that contain nicotine or tobacco. These products include cigarettes, chewing tobacco, and vaping devices, such as e-cigarettes. If you need help quitting, ask your health care provider. Do not use drugs. If you are sexually active, practice safe sex. Use a condom or other form of protection to prevent STIs. If you do not wish to become pregnant, use a form of birth control. If you plan to become pregnant, see your health care provider for a prepregnancy visit. Take aspirin only as told by your health care provider. Make sure that you understand how much to take and what form to take. Work with your health care provider to find out whether it is safe and beneficial for you to take aspirin daily. Find healthy ways to manage stress, such as: Meditation, yoga, or listening to music. Journaling. Talking to a trusted person. Spending time with friends and family. Minimize exposure to UV radiation to reduce your risk of skin cancer. Safety Always wear your seat belt while driving or riding in a vehicle. Do not drive: If you have been drinking alcohol. Do not ride with someone who has  been drinking. When you are tired or distracted. While texting. If you have been using any mind-altering substances or drugs. Wear a helmet and other protective equipment during sports activities. If you have firearms in your house, make sure you follow all gun safety procedures. Seek help if you have been physically or sexually abused. What's next? Visit your health care provider once a year for an annual wellness visit. Ask your health care provider how often you should have your eyes and teeth checked. Stay up to date on all vaccines. This information is  not intended to replace advice given to you by your health care provider. Make sure you discuss any questions you have with your health care provider. Document Revised: 03/02/2021 Document Reviewed: 03/02/2021 Elsevier Patient Education  2024 [employer].

## 2024-10-23 NOTE — Assessment & Plan Note (Signed)
 Stable on Lexapro 10 mg daily per OB/GYN.

## 2024-10-23 NOTE — Assessment & Plan Note (Signed)
 On Mounjaro  per physician in [place] .  She is working on lifestyle interventions.

## 2024-10-23 NOTE — Assessment & Plan Note (Signed)
 Patient with irregular periods for the last couple years.  Discussed with patient that she is likely in the perimenopausal phase though this is somewhat difficult to determine due to her having a Mirena in place.  Advised her to follow-up with OB/GYN to discuss management options.

## 2024-10-24 ENCOUNTER — Ambulatory Visit: Admitting: Rehabilitative and Restorative Service Providers"

## 2024-10-24 ENCOUNTER — Encounter: Payer: Self-pay | Admitting: Rehabilitative and Restorative Service Providers"

## 2024-10-24 DIAGNOSIS — M6281 Muscle weakness (generalized): Secondary | ICD-10-CM

## 2024-10-24 DIAGNOSIS — M5459 Other low back pain: Secondary | ICD-10-CM

## 2024-10-24 DIAGNOSIS — R252 Cramp and spasm: Secondary | ICD-10-CM

## 2024-10-24 DIAGNOSIS — R278 Other lack of coordination: Secondary | ICD-10-CM

## 2024-10-24 LAB — HEPATITIS C ANTIBODY: Hepatitis C Ab: NONREACTIVE
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
## 2024-10-29 ENCOUNTER — Ambulatory Visit: Admitting: Physical Therapy

## 2024-10-31 ENCOUNTER — Ambulatory Visit: Admitting: Physical Therapy

## 2024-11-05 ENCOUNTER — Ambulatory Visit: Admitting: Physical Therapy

## 2024-11-05 ENCOUNTER — Ambulatory Visit: Admitting: Sports Medicine

## 2024-11-07 ENCOUNTER — Ambulatory Visit: Admitting: Rehabilitative and Restorative Service Providers"

## 2024-11-12 ENCOUNTER — Ambulatory Visit: Admitting: Physical Therapy

## 2024-11-14 ENCOUNTER — Ambulatory Visit: Admitting: Physical Therapy

## 2024-11-19 ENCOUNTER — Ambulatory Visit: Admitting: Physical Therapy

## 2024-11-21 ENCOUNTER — Ambulatory Visit: Admitting: Rehabilitative and Restorative Service Providers"

## 2025-01-16 ENCOUNTER — Encounter: Admitting: Family Medicine

## 2025-10-27 ENCOUNTER — Encounter: Admitting: Family Medicine
